# Patient Record
Sex: Male | Born: 1935 | Race: White | Hispanic: No | Marital: Married | Smoking: Former smoker
Health system: Southern US, Community
[De-identification: ages and names within clinical notes are randomized; demographics above are authoritative.]

## PROBLEM LIST (undated history)

## (undated) DIAGNOSIS — I499 Cardiac arrhythmia, unspecified: Secondary | ICD-10-CM

## (undated) DIAGNOSIS — I509 Heart failure, unspecified: Secondary | ICD-10-CM

---

## 2004-02-27 DIAGNOSIS — D126 Benign neoplasm of colon, unspecified: Secondary | ICD-10-CM

## 2004-08-08 ENCOUNTER — Emergency Department: Payer: Self-pay | Admitting: Unknown Physician Specialty

## 2004-12-19 ENCOUNTER — Ambulatory Visit: Payer: Self-pay | Admitting: Unknown Physician Specialty

## 2005-02-02 ENCOUNTER — Ambulatory Visit: Payer: Self-pay | Admitting: Family Medicine

## 2006-10-01 ENCOUNTER — Ambulatory Visit: Payer: Self-pay | Admitting: Family Medicine

## 2006-10-14 ENCOUNTER — Ambulatory Visit: Payer: Self-pay | Admitting: General Surgery

## 2006-10-19 ENCOUNTER — Ambulatory Visit: Payer: Self-pay | Admitting: General Surgery

## 2007-04-21 ENCOUNTER — Ambulatory Visit: Payer: Self-pay | Admitting: Family Medicine

## 2007-05-09 ENCOUNTER — Ambulatory Visit: Payer: Self-pay | Admitting: Unknown Physician Specialty

## 2008-02-06 ENCOUNTER — Ambulatory Visit: Payer: Self-pay | Admitting: Family Medicine

## 2008-02-09 ENCOUNTER — Inpatient Hospital Stay: Payer: Self-pay | Admitting: Internal Medicine

## 2008-02-29 ENCOUNTER — Ambulatory Visit: Payer: Self-pay | Admitting: Gastroenterology

## 2008-03-21 ENCOUNTER — Ambulatory Visit: Payer: Self-pay | Admitting: Family Medicine

## 2008-04-16 DIAGNOSIS — Z8719 Personal history of other diseases of the digestive system: Secondary | ICD-10-CM

## 2009-02-10 IMAGING — CR RIGHT HAND - COMPLETE 3+ VIEW
1 series · 3 of 3 positions shown · non-contrast
Comparison: none

REASON FOR EXAM: pathologic fracture;
COMMENTS:

[Series 2: view not recorded · 0.17mm/px · 3 of 3 slices shown]
[im 1/3]
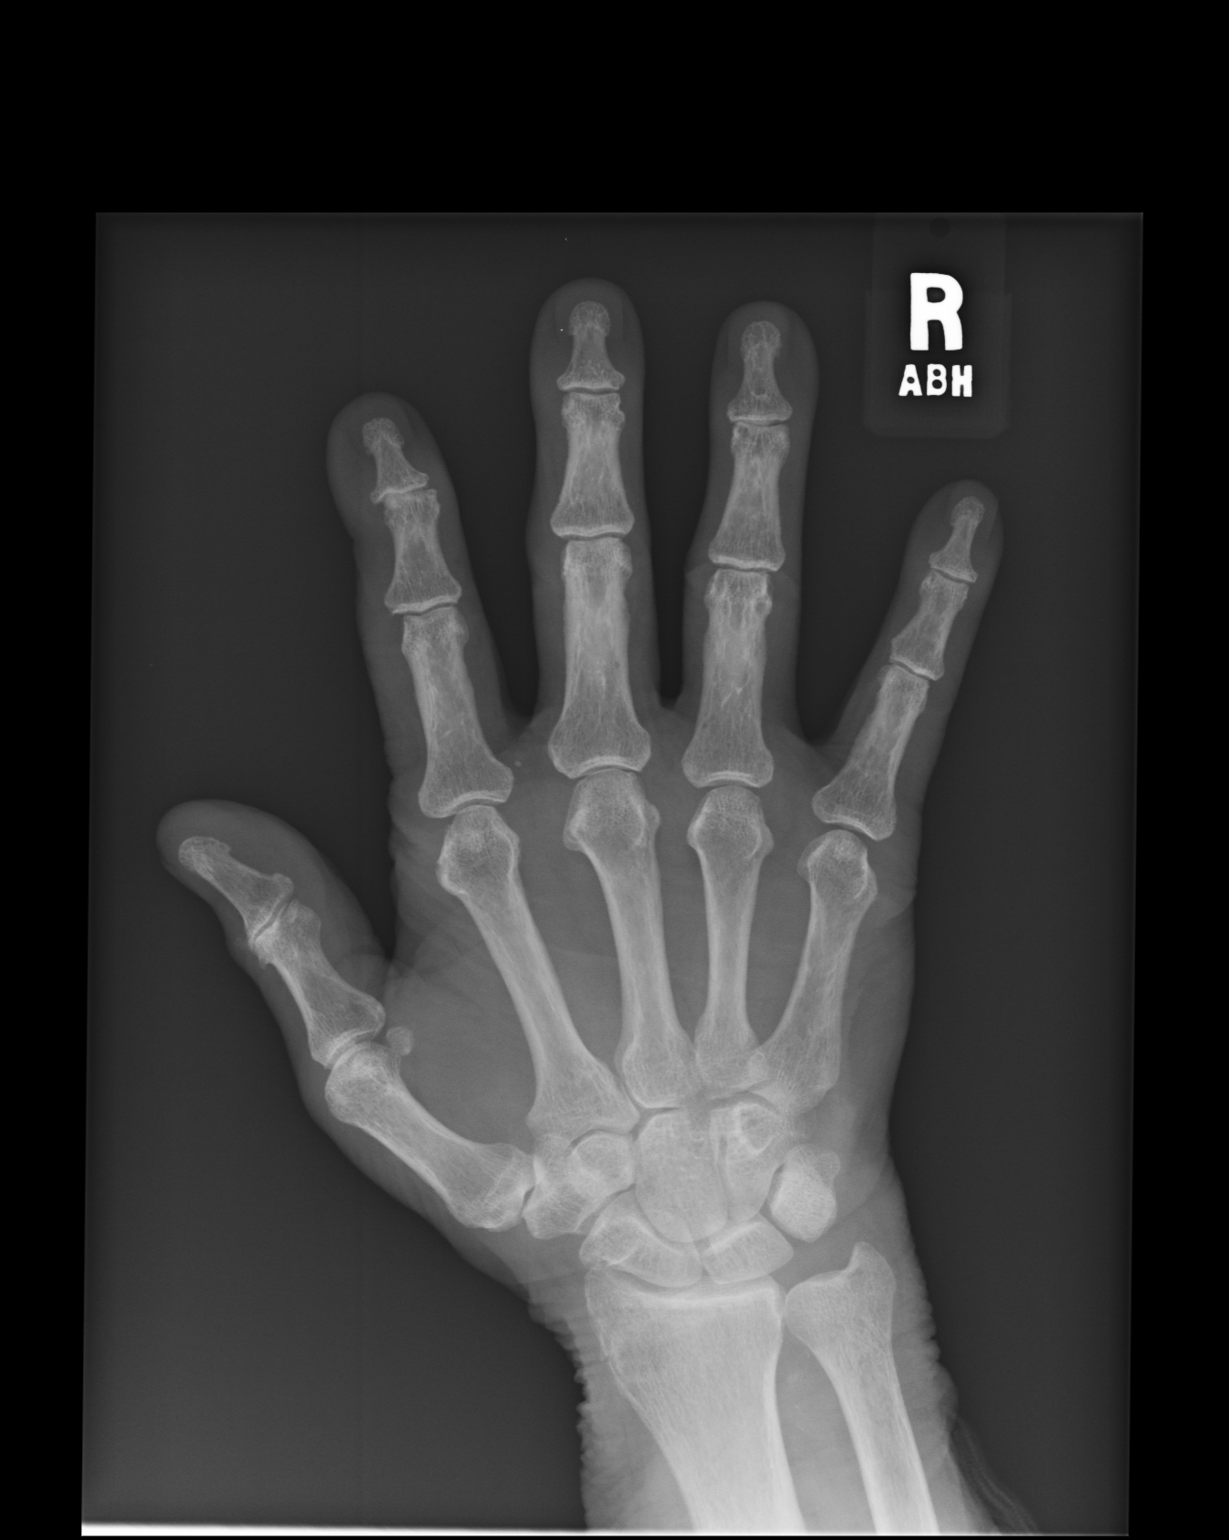
[im 2/3]
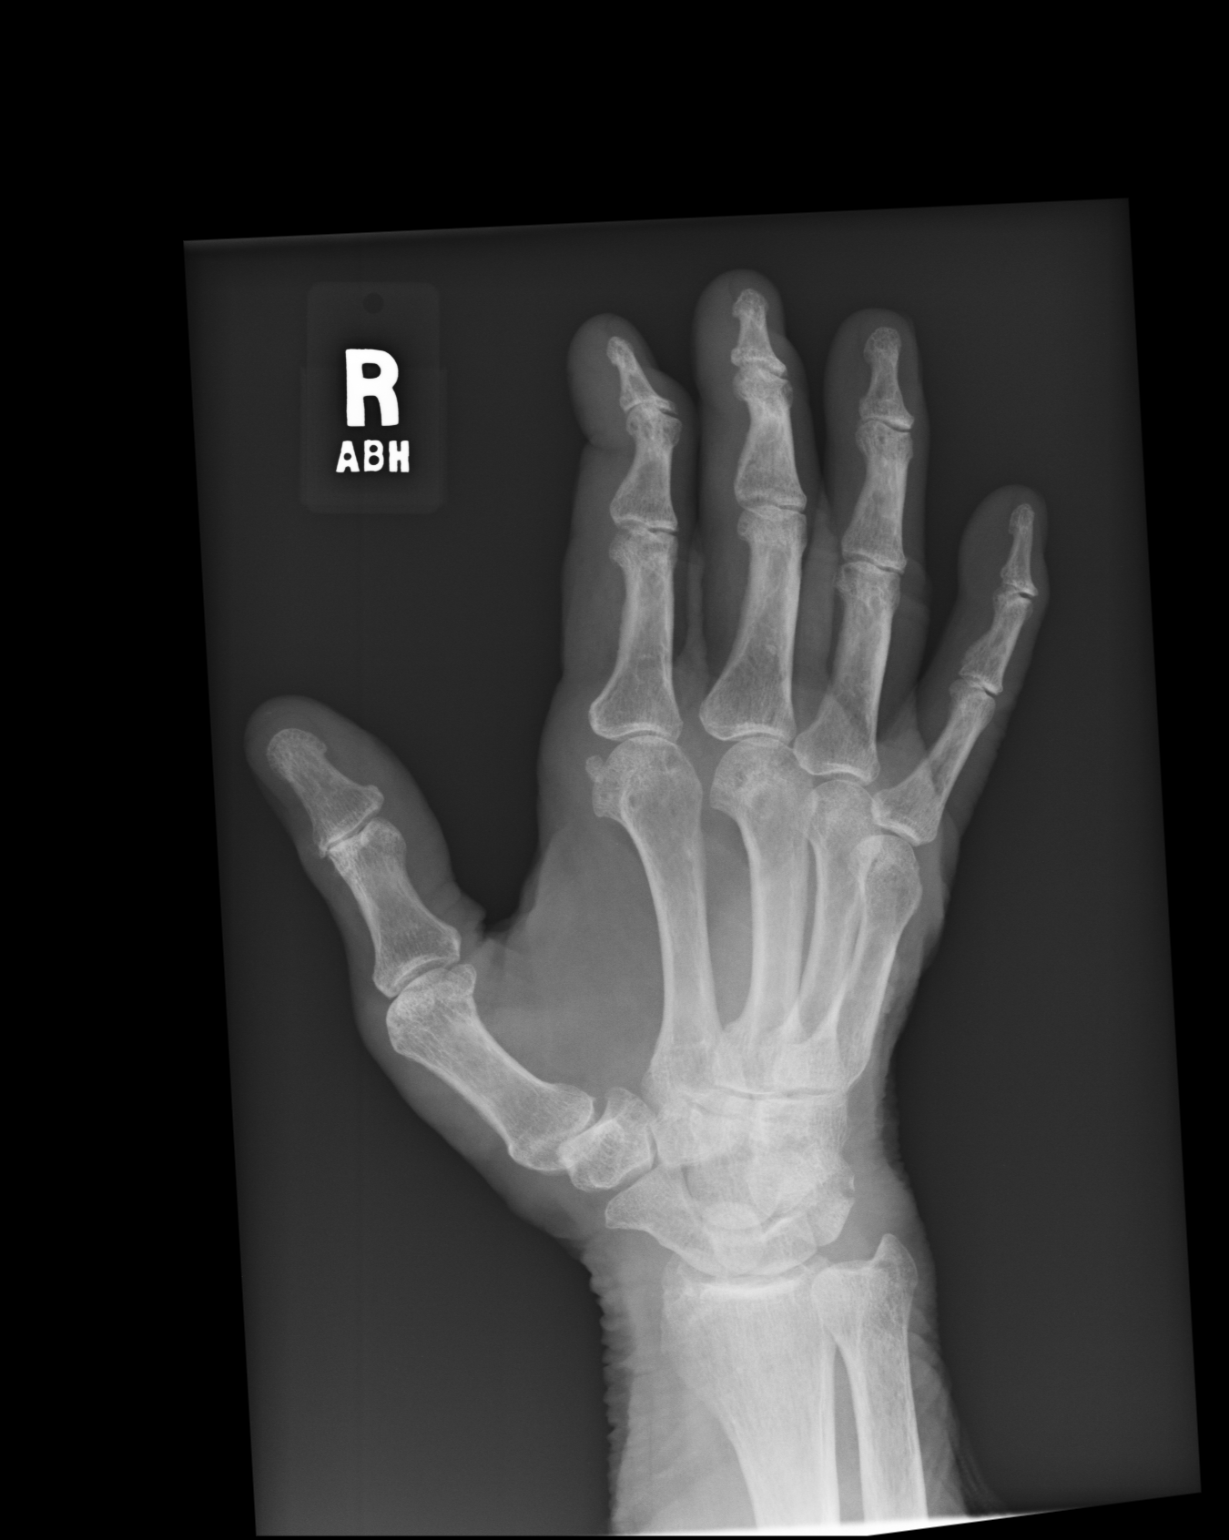
[im 3/3]
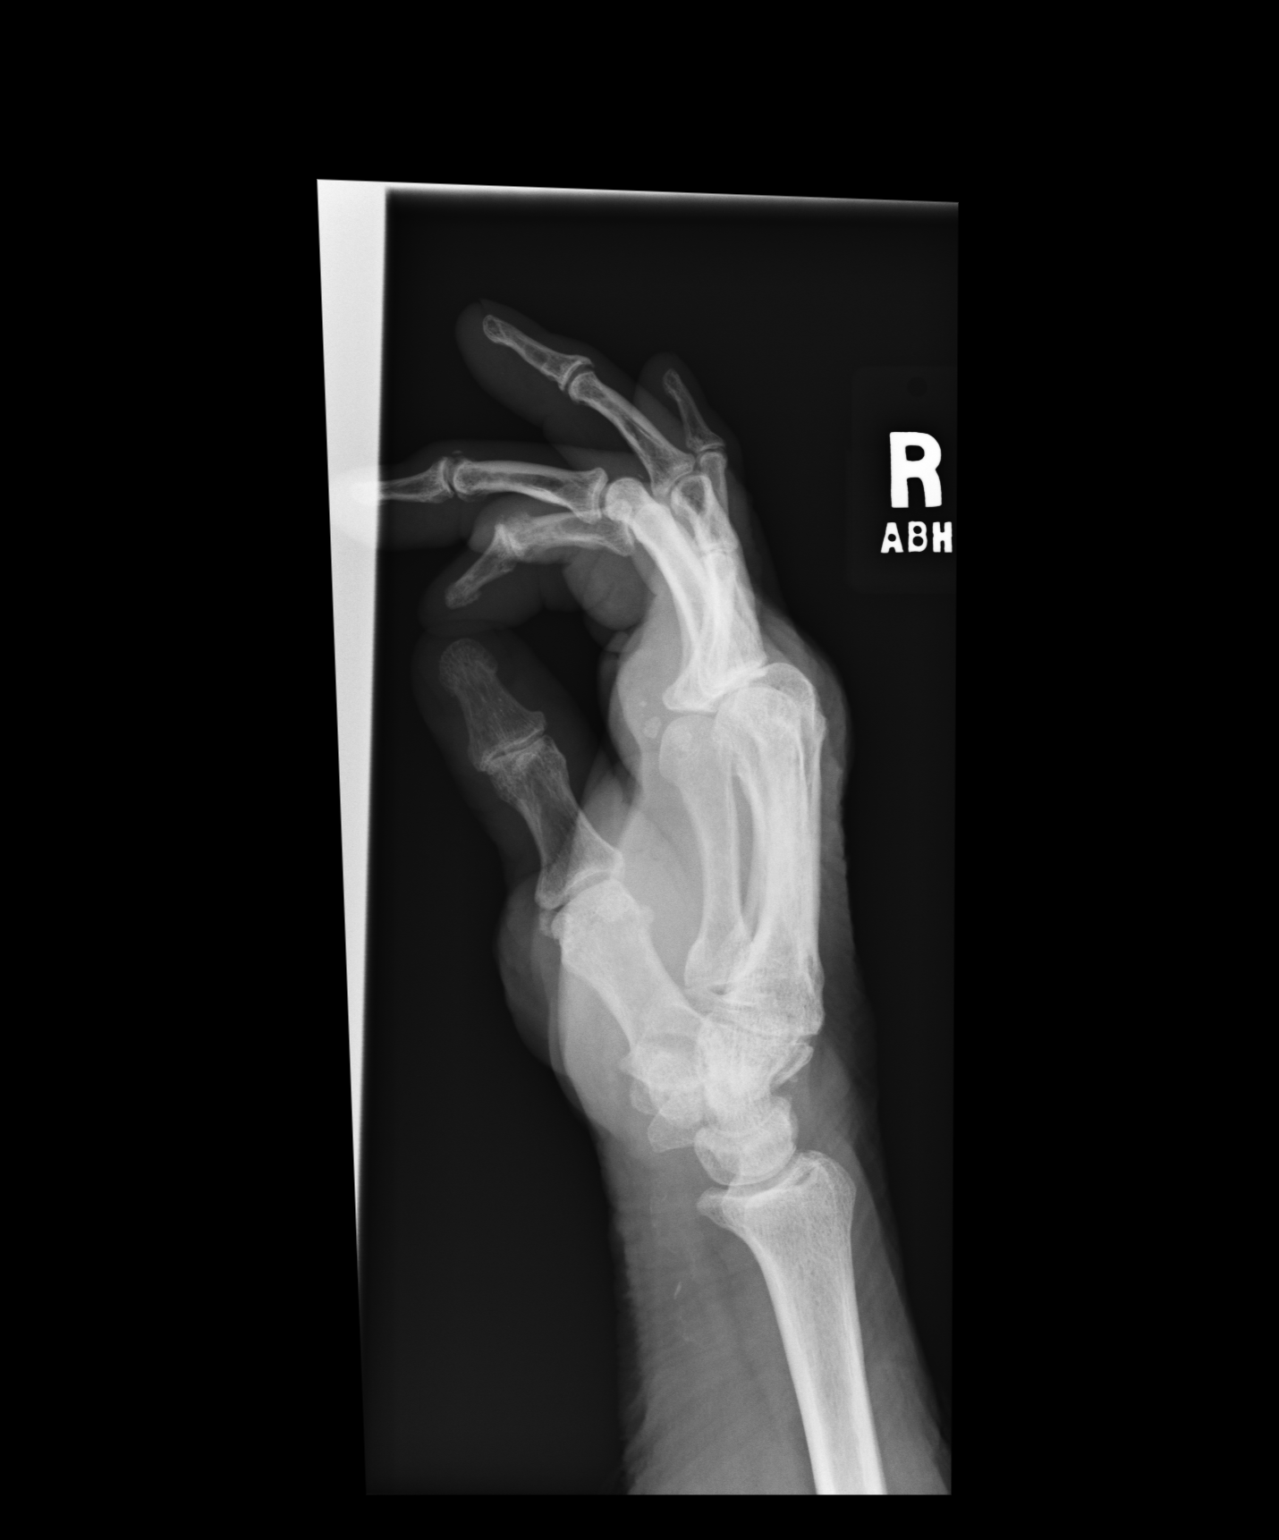

[3 of 3 positions shown; findings below may reference images not displayed]

PROCEDURE:     KDR - KDXR HAND RT COMPLETE W/OBLIQUES  - March 21, 2008  [DATE]

RESULT:     Three views of the hand were obtained. No fracture, dislocation,
or other acute bony abnormality is identified. Attention to the wrist again
shows a minimally displaced chip fracture of the dorsal aspect of the
triquetrum. There was also present on the exam of 02/06/2008. No additional
fractures are seen. There is noted mild degenerative spurring at the DIP
joints of the second through the fifth fingers and at the IP joint of the
thumb.
IMPRESSION: 1. There is an old chip fracture of the triquetrum.
2. No new fractures are identified.
3. Hypertrophic spurring is present at the DIP joints and at the IP joint of
the thumb as noted above.

## 2009-09-17 ENCOUNTER — Ambulatory Visit: Payer: Self-pay | Admitting: Ophthalmology

## 2009-09-25 ENCOUNTER — Ambulatory Visit: Payer: Self-pay | Admitting: Ophthalmology

## 2009-11-13 ENCOUNTER — Ambulatory Visit: Payer: Self-pay | Admitting: Anesthesiology

## 2009-11-13 ENCOUNTER — Ambulatory Visit: Payer: Self-pay | Admitting: Ophthalmology

## 2011-02-20 DIAGNOSIS — R42 Dizziness and giddiness: Secondary | ICD-10-CM | POA: Diagnosis not present

## 2011-02-20 DIAGNOSIS — Z7901 Long term (current) use of anticoagulants: Secondary | ICD-10-CM | POA: Diagnosis not present

## 2011-03-20 DIAGNOSIS — R42 Dizziness and giddiness: Secondary | ICD-10-CM | POA: Diagnosis not present

## 2011-03-20 DIAGNOSIS — Z7901 Long term (current) use of anticoagulants: Secondary | ICD-10-CM | POA: Diagnosis not present

## 2011-03-24 DIAGNOSIS — L570 Actinic keratosis: Secondary | ICD-10-CM | POA: Diagnosis not present

## 2011-03-24 DIAGNOSIS — L819 Disorder of pigmentation, unspecified: Secondary | ICD-10-CM | POA: Diagnosis not present

## 2011-03-24 DIAGNOSIS — L821 Other seborrheic keratosis: Secondary | ICD-10-CM | POA: Diagnosis not present

## 2011-03-24 DIAGNOSIS — C4401 Basal cell carcinoma of skin of lip: Secondary | ICD-10-CM | POA: Diagnosis not present

## 2011-03-24 DIAGNOSIS — L219 Seborrheic dermatitis, unspecified: Secondary | ICD-10-CM | POA: Diagnosis not present

## 2011-04-17 DIAGNOSIS — Z7901 Long term (current) use of anticoagulants: Secondary | ICD-10-CM | POA: Diagnosis not present

## 2011-04-17 DIAGNOSIS — R42 Dizziness and giddiness: Secondary | ICD-10-CM | POA: Diagnosis not present

## 2011-05-13 DIAGNOSIS — R42 Dizziness and giddiness: Secondary | ICD-10-CM | POA: Diagnosis not present

## 2011-05-13 DIAGNOSIS — Z7901 Long term (current) use of anticoagulants: Secondary | ICD-10-CM | POA: Diagnosis not present

## 2011-06-10 ENCOUNTER — Ambulatory Visit: Payer: Self-pay | Admitting: Family Medicine

## 2011-06-10 DIAGNOSIS — Z7901 Long term (current) use of anticoagulants: Secondary | ICD-10-CM | POA: Diagnosis not present

## 2011-06-10 DIAGNOSIS — J9819 Other pulmonary collapse: Secondary | ICD-10-CM | POA: Diagnosis not present

## 2011-06-10 DIAGNOSIS — R05 Cough: Secondary | ICD-10-CM | POA: Diagnosis not present

## 2011-07-08 DIAGNOSIS — Z7901 Long term (current) use of anticoagulants: Secondary | ICD-10-CM | POA: Diagnosis not present

## 2011-07-08 DIAGNOSIS — R05 Cough: Secondary | ICD-10-CM | POA: Diagnosis not present

## 2011-08-05 DIAGNOSIS — Z7901 Long term (current) use of anticoagulants: Secondary | ICD-10-CM | POA: Diagnosis not present

## 2011-08-05 DIAGNOSIS — R05 Cough: Secondary | ICD-10-CM | POA: Diagnosis not present

## 2011-09-04 DIAGNOSIS — Z7901 Long term (current) use of anticoagulants: Secondary | ICD-10-CM | POA: Diagnosis not present

## 2011-09-04 DIAGNOSIS — R05 Cough: Secondary | ICD-10-CM | POA: Diagnosis not present

## 2011-09-30 DIAGNOSIS — Z7901 Long term (current) use of anticoagulants: Secondary | ICD-10-CM | POA: Diagnosis not present

## 2011-09-30 DIAGNOSIS — R05 Cough: Secondary | ICD-10-CM | POA: Diagnosis not present

## 2011-10-12 DIAGNOSIS — Z7901 Long term (current) use of anticoagulants: Secondary | ICD-10-CM | POA: Diagnosis not present

## 2011-10-12 DIAGNOSIS — Z86010 Personal history of colonic polyps: Secondary | ICD-10-CM | POA: Diagnosis not present

## 2011-10-12 DIAGNOSIS — K921 Melena: Secondary | ICD-10-CM | POA: Diagnosis not present

## 2011-10-13 ENCOUNTER — Observation Stay: Payer: Self-pay | Admitting: Internal Medicine

## 2011-10-13 DIAGNOSIS — Z7901 Long term (current) use of anticoagulants: Secondary | ICD-10-CM | POA: Diagnosis not present

## 2011-10-13 DIAGNOSIS — Z8719 Personal history of other diseases of the digestive system: Secondary | ICD-10-CM | POA: Diagnosis not present

## 2011-10-13 DIAGNOSIS — K219 Gastro-esophageal reflux disease without esophagitis: Secondary | ICD-10-CM | POA: Diagnosis not present

## 2011-10-13 DIAGNOSIS — K922 Gastrointestinal hemorrhage, unspecified: Secondary | ICD-10-CM | POA: Diagnosis not present

## 2011-10-13 DIAGNOSIS — Z79899 Other long term (current) drug therapy: Secondary | ICD-10-CM | POA: Diagnosis not present

## 2011-10-13 DIAGNOSIS — I10 Essential (primary) hypertension: Secondary | ICD-10-CM | POA: Diagnosis not present

## 2011-10-13 DIAGNOSIS — E785 Hyperlipidemia, unspecified: Secondary | ICD-10-CM | POA: Diagnosis not present

## 2011-10-13 DIAGNOSIS — N400 Enlarged prostate without lower urinary tract symptoms: Secondary | ICD-10-CM | POA: Diagnosis not present

## 2011-10-13 DIAGNOSIS — Z806 Family history of leukemia: Secondary | ICD-10-CM | POA: Diagnosis not present

## 2011-10-13 DIAGNOSIS — K921 Melena: Secondary | ICD-10-CM | POA: Diagnosis not present

## 2011-10-13 DIAGNOSIS — Z9889 Other specified postprocedural states: Secondary | ICD-10-CM | POA: Diagnosis not present

## 2011-10-13 DIAGNOSIS — I4891 Unspecified atrial fibrillation: Secondary | ICD-10-CM | POA: Diagnosis not present

## 2011-10-13 LAB — CBC
HCT: 42.6 % (ref 40.0–52.0)
HGB: 14.4 g/dL (ref 13.0–18.0)
MCH: 31.5 pg (ref 26.0–34.0)
MCV: 93 fL (ref 80–100)
RBC: 4.58 10*6/uL (ref 4.40–5.90)
RDW: 14.1 % (ref 11.5–14.5)

## 2011-10-13 LAB — URINALYSIS, COMPLETE
Bacteria: NONE SEEN
Bilirubin,UR: NEGATIVE
Blood: NEGATIVE
Glucose,UR: NEGATIVE mg/dL (ref 0–75)
Leukocyte Esterase: NEGATIVE
Protein: NEGATIVE
RBC,UR: NONE SEEN /HPF (ref 0–5)
Specific Gravity: 1.008 (ref 1.003–1.030)
Squamous Epithelial: NONE SEEN
WBC UR: <1 /HPF (ref 0–5)

## 2011-10-13 LAB — COMPREHENSIVE METABOLIC PANEL
Albumin: 3.3 g/dL — ABNORMAL LOW (ref 3.4–5.0)
Alkaline Phosphatase: 81 U/L (ref 50–136)
Anion Gap: 7 (ref 7–16)
Bilirubin,Total: 0.6 mg/dL (ref 0.2–1.0)
Calcium, Total: 8.8 mg/dL (ref 8.5–10.1)
Co2: 26 mmol/L (ref 21–32)
Creatinine: 1.12 mg/dL (ref 0.60–1.30)
EGFR (Non-African Amer.): 59 — ABNORMAL LOW
Glucose: 108 mg/dL — ABNORMAL HIGH (ref 65–99)
Osmolality: 274 (ref 275–301)
Potassium: 3.8 mmol/L (ref 3.5–5.1)
SGOT(AST): 26 U/L (ref 15–37)
SGPT (ALT): 19 U/L (ref 12–78)
Sodium: 136 mmol/L (ref 136–145)
Total Protein: 7.1 g/dL (ref 6.4–8.2)

## 2011-10-13 LAB — PROTIME-INR
INR: 1.9
Prothrombin Time: 22.3 secs — ABNORMAL HIGH (ref 11.5–14.7)

## 2011-10-13 LAB — HEMOGLOBIN: HGB: 13.6 g/dL (ref 13.0–18.0)

## 2011-10-14 DIAGNOSIS — I10 Essential (primary) hypertension: Secondary | ICD-10-CM | POA: Diagnosis not present

## 2011-10-14 DIAGNOSIS — K922 Gastrointestinal hemorrhage, unspecified: Secondary | ICD-10-CM | POA: Diagnosis not present

## 2011-10-14 DIAGNOSIS — K920 Hematemesis: Secondary | ICD-10-CM | POA: Diagnosis not present

## 2011-10-14 DIAGNOSIS — K219 Gastro-esophageal reflux disease without esophagitis: Secondary | ICD-10-CM | POA: Diagnosis not present

## 2011-10-14 DIAGNOSIS — I4891 Unspecified atrial fibrillation: Secondary | ICD-10-CM | POA: Diagnosis not present

## 2011-10-14 LAB — HEMATOCRIT: HCT: 40.1 % (ref 40.0–52.0)

## 2011-10-14 LAB — PROTIME-INR: Prothrombin Time: 23.2 secs — ABNORMAL HIGH (ref 11.5–14.7)

## 2011-10-14 LAB — HEMOGLOBIN: HGB: 13.3 g/dL (ref 13.0–18.0)

## 2011-10-15 DIAGNOSIS — K922 Gastrointestinal hemorrhage, unspecified: Secondary | ICD-10-CM | POA: Diagnosis not present

## 2011-10-15 DIAGNOSIS — K219 Gastro-esophageal reflux disease without esophagitis: Secondary | ICD-10-CM | POA: Diagnosis not present

## 2011-10-15 DIAGNOSIS — I4891 Unspecified atrial fibrillation: Secondary | ICD-10-CM | POA: Diagnosis not present

## 2011-10-15 DIAGNOSIS — I10 Essential (primary) hypertension: Secondary | ICD-10-CM | POA: Diagnosis not present

## 2011-10-15 LAB — CBC WITH DIFFERENTIAL/PLATELET
Basophil #: 0 10*3/uL (ref 0.0–0.1)
HCT: 43 % (ref 40.0–52.0)
HGB: 14.5 g/dL (ref 13.0–18.0)
Lymphocyte #: 1 10*3/uL (ref 1.0–3.6)
MCH: 31.5 pg (ref 26.0–34.0)
MCHC: 33.7 g/dL (ref 32.0–36.0)
MCV: 93 fL (ref 80–100)
Monocyte %: 7.3 %
Platelet: 170 10*3/uL (ref 150–440)
RBC: 4.61 10*6/uL (ref 4.40–5.90)
RDW: 14.4 % (ref 11.5–14.5)
WBC: 7.2 10*3/uL (ref 3.8–10.6)

## 2011-10-21 DIAGNOSIS — I4891 Unspecified atrial fibrillation: Secondary | ICD-10-CM | POA: Diagnosis not present

## 2011-10-21 DIAGNOSIS — K922 Gastrointestinal hemorrhage, unspecified: Secondary | ICD-10-CM | POA: Diagnosis not present

## 2011-10-21 DIAGNOSIS — Z7901 Long term (current) use of anticoagulants: Secondary | ICD-10-CM | POA: Diagnosis not present

## 2011-11-11 DIAGNOSIS — Z23 Encounter for immunization: Secondary | ICD-10-CM | POA: Diagnosis not present

## 2011-11-11 DIAGNOSIS — Z8719 Personal history of other diseases of the digestive system: Secondary | ICD-10-CM | POA: Diagnosis not present

## 2011-11-11 DIAGNOSIS — Z7901 Long term (current) use of anticoagulants: Secondary | ICD-10-CM | POA: Diagnosis not present

## 2011-11-11 DIAGNOSIS — I4891 Unspecified atrial fibrillation: Secondary | ICD-10-CM | POA: Diagnosis not present

## 2011-11-11 DIAGNOSIS — K219 Gastro-esophageal reflux disease without esophagitis: Secondary | ICD-10-CM | POA: Diagnosis not present

## 2011-11-12 DIAGNOSIS — H353190 Nonexudative age-related macular degeneration, unspecified eye, stage unspecified: Secondary | ICD-10-CM | POA: Diagnosis not present

## 2011-11-27 DIAGNOSIS — I4891 Unspecified atrial fibrillation: Secondary | ICD-10-CM | POA: Diagnosis not present

## 2011-11-27 DIAGNOSIS — Z23 Encounter for immunization: Secondary | ICD-10-CM | POA: Diagnosis not present

## 2011-11-27 DIAGNOSIS — K219 Gastro-esophageal reflux disease without esophagitis: Secondary | ICD-10-CM | POA: Diagnosis not present

## 2011-11-29 ENCOUNTER — Emergency Department: Payer: Self-pay | Admitting: Emergency Medicine

## 2011-11-29 DIAGNOSIS — K922 Gastrointestinal hemorrhage, unspecified: Secondary | ICD-10-CM | POA: Diagnosis not present

## 2011-11-29 DIAGNOSIS — Z79899 Other long term (current) drug therapy: Secondary | ICD-10-CM | POA: Diagnosis not present

## 2011-11-29 DIAGNOSIS — Z9089 Acquired absence of other organs: Secondary | ICD-10-CM | POA: Diagnosis not present

## 2011-11-29 DIAGNOSIS — Z7982 Long term (current) use of aspirin: Secondary | ICD-10-CM | POA: Diagnosis not present

## 2011-11-29 DIAGNOSIS — K219 Gastro-esophageal reflux disease without esophagitis: Secondary | ICD-10-CM | POA: Diagnosis not present

## 2011-11-29 DIAGNOSIS — R6889 Other general symptoms and signs: Secondary | ICD-10-CM | POA: Diagnosis not present

## 2011-11-29 DIAGNOSIS — I10 Essential (primary) hypertension: Secondary | ICD-10-CM | POA: Diagnosis not present

## 2011-11-29 LAB — PROTIME-INR: Prothrombin Time: 14 secs (ref 11.5–14.7)

## 2011-11-29 LAB — CBC
HCT: 38.9 % — ABNORMAL LOW (ref 40.0–52.0)
HGB: 13.1 g/dL (ref 13.0–18.0)
MCH: 31.7 pg (ref 26.0–34.0)
MCHC: 33.7 g/dL (ref 32.0–36.0)
MCV: 94 fL (ref 80–100)
Platelet: 144 10*3/uL — ABNORMAL LOW (ref 150–440)
RBC: 4.13 10*6/uL — ABNORMAL LOW (ref 4.40–5.90)

## 2011-11-29 LAB — COMPREHENSIVE METABOLIC PANEL
Alkaline Phosphatase: 88 U/L (ref 50–136)
Bilirubin,Total: 0.7 mg/dL (ref 0.2–1.0)
Calcium, Total: 8.6 mg/dL (ref 8.5–10.1)
Chloride: 105 mmol/L (ref 98–107)
Co2: 28 mmol/L (ref 21–32)
EGFR (Non-African Amer.): 54 — ABNORMAL LOW
SGOT(AST): 21 U/L (ref 15–37)
SGPT (ALT): 16 U/L (ref 12–78)
Total Protein: 6.9 g/dL (ref 6.4–8.2)

## 2011-11-29 LAB — APTT: Activated PTT: 32.7 secs (ref 23.6–35.9)

## 2011-12-07 DIAGNOSIS — K219 Gastro-esophageal reflux disease without esophagitis: Secondary | ICD-10-CM | POA: Diagnosis not present

## 2012-03-14 DIAGNOSIS — L819 Disorder of pigmentation, unspecified: Secondary | ICD-10-CM | POA: Diagnosis not present

## 2012-03-14 DIAGNOSIS — L578 Other skin changes due to chronic exposure to nonionizing radiation: Secondary | ICD-10-CM | POA: Diagnosis not present

## 2012-03-14 DIAGNOSIS — L570 Actinic keratosis: Secondary | ICD-10-CM | POA: Diagnosis not present

## 2012-03-14 DIAGNOSIS — L821 Other seborrheic keratosis: Secondary | ICD-10-CM | POA: Diagnosis not present

## 2012-03-14 DIAGNOSIS — Z0189 Encounter for other specified special examinations: Secondary | ICD-10-CM | POA: Diagnosis not present

## 2012-03-22 DIAGNOSIS — I4891 Unspecified atrial fibrillation: Secondary | ICD-10-CM | POA: Diagnosis not present

## 2012-03-22 DIAGNOSIS — I10 Essential (primary) hypertension: Secondary | ICD-10-CM | POA: Diagnosis not present

## 2012-05-12 DIAGNOSIS — H353190 Nonexudative age-related macular degeneration, unspecified eye, stage unspecified: Secondary | ICD-10-CM | POA: Diagnosis not present

## 2012-09-20 DIAGNOSIS — K219 Gastro-esophageal reflux disease without esophagitis: Secondary | ICD-10-CM | POA: Diagnosis not present

## 2012-09-20 DIAGNOSIS — I4891 Unspecified atrial fibrillation: Secondary | ICD-10-CM | POA: Diagnosis not present

## 2012-11-10 DIAGNOSIS — H353190 Nonexudative age-related macular degeneration, unspecified eye, stage unspecified: Secondary | ICD-10-CM | POA: Diagnosis not present

## 2012-12-06 DIAGNOSIS — Z23 Encounter for immunization: Secondary | ICD-10-CM | POA: Diagnosis not present

## 2013-03-22 DIAGNOSIS — L819 Disorder of pigmentation, unspecified: Secondary | ICD-10-CM | POA: Diagnosis not present

## 2013-03-22 DIAGNOSIS — L821 Other seborrheic keratosis: Secondary | ICD-10-CM | POA: Diagnosis not present

## 2013-03-22 DIAGNOSIS — D1801 Hemangioma of skin and subcutaneous tissue: Secondary | ICD-10-CM | POA: Diagnosis not present

## 2013-03-22 DIAGNOSIS — L570 Actinic keratosis: Secondary | ICD-10-CM | POA: Diagnosis not present

## 2013-03-22 DIAGNOSIS — L578 Other skin changes due to chronic exposure to nonionizing radiation: Secondary | ICD-10-CM | POA: Diagnosis not present

## 2013-04-14 DIAGNOSIS — K219 Gastro-esophageal reflux disease without esophagitis: Secondary | ICD-10-CM | POA: Diagnosis not present

## 2013-04-14 DIAGNOSIS — R030 Elevated blood-pressure reading, without diagnosis of hypertension: Secondary | ICD-10-CM | POA: Diagnosis not present

## 2013-05-11 DIAGNOSIS — H353190 Nonexudative age-related macular degeneration, unspecified eye, stage unspecified: Secondary | ICD-10-CM | POA: Diagnosis not present

## 2013-06-02 DIAGNOSIS — K219 Gastro-esophageal reflux disease without esophagitis: Secondary | ICD-10-CM | POA: Diagnosis not present

## 2013-06-02 DIAGNOSIS — K5732 Diverticulitis of large intestine without perforation or abscess without bleeding: Secondary | ICD-10-CM | POA: Diagnosis not present

## 2013-11-09 DIAGNOSIS — H3530 Unspecified macular degeneration: Secondary | ICD-10-CM | POA: Diagnosis not present

## 2013-11-21 DIAGNOSIS — Z23 Encounter for immunization: Secondary | ICD-10-CM | POA: Diagnosis not present

## 2013-12-13 DIAGNOSIS — Z23 Encounter for immunization: Secondary | ICD-10-CM | POA: Diagnosis not present

## 2014-05-08 DIAGNOSIS — H3531 Nonexudative age-related macular degeneration: Secondary | ICD-10-CM | POA: Diagnosis not present

## 2014-05-08 DIAGNOSIS — H26492 Other secondary cataract, left eye: Secondary | ICD-10-CM | POA: Diagnosis not present

## 2014-06-05 NOTE — Discharge Summary (Signed)
PATIENT NAME:  Thomas Matthews, Thomas Matthews MR#:  [id number] DATE OF BIRTH:  Feb 23, 1923  DATE OF ADMISSION:  10/13/2011 DATE OF DISCHARGE:  10/15/2011  DIAGNOSES:  1. Lower GI bleed, possibly diverticular, resolved. 2. Hypertension. 3. Chronic atrial fibrillation. 4. Gastroesophageal reflux disease. 5. Benign prostatic hypertrophy.  6. Hyperlipidemia.  DISPOSITION: The patient is being discharged home.   FOLLOWUP: Follow up with primary care physician Dr. Lelon Huh within one week.   DIET: Low sodium.   ACTIVITY: As tolerated.   The patient has been advised not to take his Coumadin until seen by PCP.   DISCHARGE MEDICATIONS:  1. Lopressor 25 mg, 0.5 tablet b.i.d.  2. Omeprazole 20 mg daily.  3. Cardizem 120 mg daily.  4. Flomax 0.4 mg daily.  5. Pravachol 10 mg daily.  6. HCTZ/lisinopril 12.5/10 mg once a day.  7. Calcium/vitamin D 600/400, 1 tablet once a day.   CONSULTATIONS:  GI consultation with Dr. Dionne Milo.   LABORATORY, DIAGNOSTIC, AND RADIOLOGICAL DATA: INR 2 to 1.9, hemoglobin remained stable 14.4. Normal white count, normal platelet count. Complete metabolic panel normal.   HOSPITAL COURSE: The patient is an 81 year old male with past medical history of hypertension, chronic atrial fibrillation, gastroesophageal reflux disease, and benign prostatic hypertrophy who was on Coumadin and presented with lower GI bleed/bright red bleeding per rectum. The patient reported that he had similar problems in 2010 and had a colonoscopy at the [place] at which time he had diverticulosis. The patient was admitted to the hospital and his  Coumadin was held. His INR was therapeutic at 2. His hemoglobin was monitored closely. He remained hemodynamically stable. Hemoglobin was also stable. Initially he was on a clear liquid diet but was tolerating a soft liquid diet without any difficulty. A GI consult with Dr. Dionne Milo was obtained who recommended conservative management. The patient's blood pressure  and chronic atrial fibrillation remained well controlled. The patient was hesitant to stop his Coumadin because he was advised by his cardiologist that he needs to be on lifelong Coumadin. The patient was informed that due to his recent GI bleed he should hold his Coumadin until he follows up with his PCP at which time he can discuss with his PCP and decide to discontinue or continue Coumadin. The patient was discharged in stable condition.   TIME SPENT: 45 minutes.    ____________________________ Cherre Huger, MD sp:bjt D: 10/15/2011 16:31:41 ET T: 10/16/2011 11:45:54 ET JOB#: [id number]  cc: Cherre Huger, MD, <Dictator> [place] Caryn Section, MD Cherre Huger MD ELECTRONICALLY SIGNED 10/16/2011 13:41

## 2014-06-05 NOTE — Consult Note (Signed)
Brief Consult Note: Diagnosis: Diverticular bleed.   Patient was seen by consultant.   Discussed with Attending MD.   Comments: Diverticular bleed, resolved.  Recommendations; Full liquid diet, advance to soft in am if no further bleeding. Probable DC home in an if no further bleeding with [facility].  Electronic Signatures: Jill Side (MD)  (Signed 28-Aug-13 17:33)  Authored: Brief Consult Note   Last Updated: 28-Aug-13 17:33 by Jill Side (MD)

## 2014-06-05 NOTE — H&P (Signed)
PATIENT NAME:  Thomas Matthews, Thomas Matthews MR#:  [id number] DATE OF BIRTH:  10-25-1923  DATE OF ADMISSION:  10/13/2011  PRIMARY CARE PHYSICIAN: Kirstie Peri. Caryn Section, MD   REFERRING PHYSICIAN:   Lisa Roca, MD   CHIEF COMPLAINT: GI bleed.   HISTORY OF PRESENT ILLNESS: The patient is an 81 year old male with a known history of hypertension, hyperlipidemia, chronic atrial fibrillation, on Coumadin. He is being admitted for GI bleed. The patient started having bloody stools since Sunday. He had a grossly bloody bowel movement at 5:00 in the morning on Sunday after which he did have two more bowel movements on Sunday which were normal without any blood. He had another two episodes of bloody bowel movement on Monday followed by another one this morning which was very small, and most of the blood was mainly when he was wiping when he noticed a lot of blood on the tissue paper. He denies any abdominal pain, nausea, vomiting, or any other symptoms.   PAST MEDICAL HISTORY:  1. Hypertension.  2. Hyperlipidemia.  3. Chronic atrial fibrillation, on Coumadin.  4. History of benign prostatic hypertrophy.  5. Gastroesophageal reflux disease.   ALLERGIES: No known drug allergies.   SOCIAL HISTORY: No smoking or alcohol use. He worked at [employer] as a Clinical research associate.   FAMILY HISTORY: Father with leukemia.   PAST SURGICAL HISTORY: Cholecystectomy in 2008.     MEDICATIONS AT HOME:  1. Tamsulosin 0.4 mg p.o. daily.  2. Calcium with vitamin D 1 tablet p.o. daily.  3. Coumadin 2 mg p.o. daily.  4. Cardizem 120 mg p.o. daily. 5. Hydrochlorothiazide/lisinopril 12.5/10, 1 tablet p.o. daily.  6. Metoprolol 12.5 mg p.o. b.i.d.  7. Omeprazole 20 mg p.o. daily.  8. Pravastatin 10 mg p.o. at bedtime.   REVIEW OF SYSTEMS: CONSTITUTIONAL: No fever, fatigue, weakness. EYES: No blurred or double vision. ENT: No tinnitus, ear pain. RESPIRATORY: No cough, hemoptysis. CARDIOVASCULAR: No chest pain, orthopnea or  edema. GASTROINTESTINAL: No nausea, vomiting, diarrhea. Did have blood in the stool on several occasions. GENITOURINARY: No dysuria or hematuria. ENDOCRINE: No polyuria or nocturia. HEMATOLOGY: No anemia or easy bruising. SKIN: No rash or lesion. MUSCULOSKELETAL: No arthritis or muscle cramp. NEUROLOGICAL: No tingling, numbness, or weakness. PSYCHIATRIC: No history of anxiety or depression.   PHYSICAL EXAMINATION:  VITAL SIGNS: Temperature 97.3, heart rate 71 per minute, respirations 20 per minute, blood pressure 155/78 mmHg. He is saturating 97% on room air.   GENERAL: The patient is an 81 year old male lying in the bed comfortably in no acute distress.   HEENT: Eyes: Pupils are equal, round, reactive to light and accommodation. No scleral icterus. Extraocular muscles are intact. HENT: Head atraumatic, normocephalic. Oropharynx and nasopharynx clear.   NECK: Supple. No jugular venous distention. No thyroid enlargement or tenderness.   LUNGS: Clear to auscultation bilaterally. No wheezing, rales, rhonchi, or crepitation.   CARDIOVASCULAR: Irregularly irregular heart sound, mild tachycardia. No murmurs, rubs, or gallop.   ABDOMEN: Soft, nontender, nondistended. Bowel sounds present. No organomegaly or mass.   EXTREMITIES: No pedal edema, cyanosis, or clubbing.  NEUROLOGIC: Nonfocal examination. Cranial nerves II through XII are intact. Muscle strength 5/5 in all extremities. Sensation intact. The patient is oriented to time, place and person x3.   SKIN: No obvious rash, lesion, or ulcer.   LABORATORY, DIAGNOSTIC AND RADIOLOGICAL DATA: Normal BMP. Normal liver function tests and CBC. Normal coags except PT of 22.3. INR was 1.9. Urinalysis was negative.   IMPRESSION AND PLAN:  1.  Gastrointestinal bleed, possibly diverticular in nature: We will consult GI. Monitor his hemoglobin and hematocrit every 8 hours. Hold Coumadin for now.  2. Chronic fibrillation, on Coumadin: We will hold Coumadin for  now. His rate is under good control. We will monitor.  3. Gastroesophageal reflux disease: We will start him on Protonix b.i.d.  4. Hypertension: His blood pressure seems to be fairly stable. We will monitor him on home medication.   CODE STATUS:  FULL CODE.     TIME TAKEN: Total time taking care of this patient, including coordination of care, discussing with the ED physician, reviewing records was 55 minutes.  ____________________________ Lucina Mellow. Manuella Ghazi, MD vss:cbb D: 10/13/2011 16:14:22 ET T: 10/13/2011 17:18:12 ET JOB#: [id number]  cc: Ardelia Wrede S. Manuella Ghazi, MD, <Dictator> Kirstie Peri. Caryn Section, MD Remer Macho MD ELECTRONICALLY SIGNED 10/13/2011 18:41

## 2014-06-05 NOTE — Consult Note (Signed)
PATIENT NAME:  Thomas Matthews, GOBERT MR#:  [id number] DATE OF BIRTH:  01/06/1924  DATE OF CONSULTATION:  10/15/2011  REFERRING PHYSICIAN:  Cherre Huger, MD  CONSULTING PHYSICIAN:  Jill Side, MD  PRIMARY CARE PHYSICIAN: Lelon Huh, MD   REASON FOR CONSULTATION: Rectal bleeding.    HISTORY OF PRESENT ILLNESS: The patient is an 81 year old man with history of hypertension, hyperlipidemia, chronic atrial fibrillation on Coumadin. The patient has history of diverticulosis. Apparently he had a colonoscopy in 2006 at [facility] which showed diverticulosis of the left colon. According to the patient, he had another colonoscopy at [facility] in 2010 due to rectal bleeding which was also supposed to be due to diverticulosis. The patient was admitted on 10/13/2011 after he had two bloody bowel movements at home. The blood was reported to be bright to dark red and was mostly seen on the toilet paper. No melena was reported by the patient. He denied any abdominal pain, nausea, vomiting, or hematemesis. When evaluated yesterday he denied any further bleeding and as of today he has not had any further bleeding. In fact, he has had several formed bowel movements since admission and they have been reported to be brown without blood.   PAST MEDICAL HISTORY:  1. Hypertension. 2. Hyperlipidemia. 3. History of chronic atrial fibrillation, on Coumadin.  4. History of benign prostatic hypertrophy. 5. Gastroesophageal reflux disease.   ALLERGIES: None.   SOCIAL HISTORY: Does not smoke or drink.   FAMILY HISTORY: Unremarkable.   PAST SURGICAL HISTORY:  1. History of cholecystectomy in 2008. 2. History of colonoscopies in the past.   MEDICATIONS AT HOME:  1. Coumadin 2 mg a day. 2. Cardizem 120 mg a day. 3. Hydrochlorothiazide/lisinopril. 4. Metoprolol.  5. Omeprazole.   6. Pravastatin.   REVIEW OF SYSTEMS: Grossly negative except for what is mentioned in the history of present illness.   SOCIAL AND FAMILY  HISTORY: Unremarkable.   PHYSICAL EXAMINATION:   GENERAL: Elderly but fairly well built male who does not appear to be in any acute distress quite awake, alert, and oriented. Does not appear to be in any distress.    VITAL SIGNS: Temperature 97.3, pulse 60, respirations 18 to 20, blood pressure 158/75.   LUNGS: Clear to auscultation.   CARDIOVASCULAR: Irregular rate and rhythm. No gallops or murmurs were heard.   ABDOMEN: Quite soft and benign. Bowel sounds are positive. Nontender, nondistended. No rebound or guarding.   EXTREMITIES: No edema, clubbing, or cyanosis.   NEUROLOGIC: Appears to be unremarkable.   LABORATORY, DIAGNOSTIC, AND RADIOLOGICAL DATA: His hemoglobin on admission was 14.4. His hemoglobin as of today after 48 hours is still 14.5. INR is 1.9 today. It was 1.9 on admission as well. Electrolytes, BUN, and creatinine within normal limits.   ASSESSMENT AND PLAN: The patient is with some rectal bleeding. His hemoglobin and hematocrit have been normal and there has been no further bleeding since admission. Most likely the bleeding was hemorrhoidal in origin as suggested by the fact that most of the blood was seen on the toilet paper on wiping and his hemoglobin and hematocrit has not changed. The patient has history of diverticulosis and diverticular bleeding is certainly a possibility although less likely. There have been no signs of further bleeding in the last 36 to 48 hours. He seems to be doing well with normal hemoglobin and hematocrit. The patient is currently being discharged home with which I agree. [facility]  [facility].   ____________________________ Jill Side, MD si:drc D: 10/15/2011 12:43:08 ET T: 10/15/2011 13:09:14 ET JOB#: [id number]  cc: Jill Side, MD, <Dictator> Kirstie Peri. Caryn Section, MD Jill Side MD ELECTRONICALLY SIGNED 10/28/2011 11:02

## 2014-06-07 DIAGNOSIS — H26492 Other secondary cataract, left eye: Secondary | ICD-10-CM | POA: Diagnosis not present

## 2014-11-15 DIAGNOSIS — Z23 Encounter for immunization: Secondary | ICD-10-CM | POA: Diagnosis not present

## 2014-12-03 DIAGNOSIS — Z85828 Personal history of other malignant neoplasm of skin: Secondary | ICD-10-CM | POA: Diagnosis not present

## 2014-12-03 DIAGNOSIS — K055 Other periodontal diseases: Secondary | ICD-10-CM | POA: Diagnosis not present

## 2014-12-21 DIAGNOSIS — H353132 Nonexudative age-related macular degeneration, bilateral, intermediate dry stage: Secondary | ICD-10-CM | POA: Diagnosis not present

## 2015-01-08 DIAGNOSIS — D485 Neoplasm of uncertain behavior of skin: Secondary | ICD-10-CM | POA: Diagnosis not present

## 2015-01-08 DIAGNOSIS — L905 Scar conditions and fibrosis of skin: Secondary | ICD-10-CM | POA: Diagnosis not present

## 2015-01-08 DIAGNOSIS — L570 Actinic keratosis: Secondary | ICD-10-CM | POA: Diagnosis not present

## 2015-01-08 DIAGNOSIS — L219 Seborrheic dermatitis, unspecified: Secondary | ICD-10-CM | POA: Diagnosis not present

## 2015-01-08 DIAGNOSIS — K055 Other periodontal diseases: Secondary | ICD-10-CM | POA: Diagnosis not present

## 2015-03-13 DIAGNOSIS — Z85828 Personal history of other malignant neoplasm of skin: Secondary | ICD-10-CM | POA: Diagnosis not present

## 2015-03-13 DIAGNOSIS — L570 Actinic keratosis: Secondary | ICD-10-CM | POA: Diagnosis not present

## 2015-07-16 DIAGNOSIS — H353132 Nonexudative age-related macular degeneration, bilateral, intermediate dry stage: Secondary | ICD-10-CM | POA: Diagnosis not present

## 2015-07-19 DIAGNOSIS — H353221 Exudative age-related macular degeneration, left eye, with active choroidal neovascularization: Secondary | ICD-10-CM | POA: Diagnosis not present

## 2015-08-30 DIAGNOSIS — H353221 Exudative age-related macular degeneration, left eye, with active choroidal neovascularization: Secondary | ICD-10-CM | POA: Diagnosis not present

## 2015-10-03 ENCOUNTER — Ambulatory Visit: Payer: Self-pay | Admitting: Family Medicine

## 2015-10-08 DIAGNOSIS — D485 Neoplasm of uncertain behavior of skin: Secondary | ICD-10-CM | POA: Diagnosis not present

## 2015-10-08 DIAGNOSIS — L905 Scar conditions and fibrosis of skin: Secondary | ICD-10-CM | POA: Diagnosis not present

## 2015-10-08 DIAGNOSIS — L821 Other seborrheic keratosis: Secondary | ICD-10-CM | POA: Diagnosis not present

## 2015-10-08 DIAGNOSIS — C44319 Basal cell carcinoma of skin of other parts of face: Secondary | ICD-10-CM | POA: Diagnosis not present

## 2015-10-08 DIAGNOSIS — Z08 Encounter for follow-up examination after completed treatment for malignant neoplasm: Secondary | ICD-10-CM | POA: Diagnosis not present

## 2015-10-08 DIAGNOSIS — C44212 Basal cell carcinoma of skin of right ear and external auricular canal: Secondary | ICD-10-CM | POA: Diagnosis not present

## 2015-10-08 DIAGNOSIS — X32XXXA Exposure to sunlight, initial encounter: Secondary | ICD-10-CM | POA: Diagnosis not present

## 2015-10-08 DIAGNOSIS — Z85828 Personal history of other malignant neoplasm of skin: Secondary | ICD-10-CM | POA: Diagnosis not present

## 2015-10-08 DIAGNOSIS — L570 Actinic keratosis: Secondary | ICD-10-CM | POA: Diagnosis not present

## 2015-10-11 DIAGNOSIS — H353221 Exudative age-related macular degeneration, left eye, with active choroidal neovascularization: Secondary | ICD-10-CM | POA: Diagnosis not present

## 2015-10-30 DIAGNOSIS — Z23 Encounter for immunization: Secondary | ICD-10-CM | POA: Diagnosis not present

## 2015-11-14 DIAGNOSIS — C44319 Basal cell carcinoma of skin of other parts of face: Secondary | ICD-10-CM | POA: Diagnosis not present

## 2015-11-14 DIAGNOSIS — C44212 Basal cell carcinoma of skin of right ear and external auricular canal: Secondary | ICD-10-CM | POA: Diagnosis not present

## 2015-11-21 DIAGNOSIS — C44319 Basal cell carcinoma of skin of other parts of face: Secondary | ICD-10-CM | POA: Diagnosis not present

## 2015-11-21 DIAGNOSIS — C44212 Basal cell carcinoma of skin of right ear and external auricular canal: Secondary | ICD-10-CM | POA: Diagnosis not present

## 2015-11-21 DIAGNOSIS — L905 Scar conditions and fibrosis of skin: Secondary | ICD-10-CM | POA: Diagnosis not present

## 2015-11-21 DIAGNOSIS — H353221 Exudative age-related macular degeneration, left eye, with active choroidal neovascularization: Secondary | ICD-10-CM | POA: Diagnosis not present

## 2016-01-07 DIAGNOSIS — H353221 Exudative age-related macular degeneration, left eye, with active choroidal neovascularization: Secondary | ICD-10-CM | POA: Diagnosis not present

## 2016-02-21 DIAGNOSIS — H353221 Exudative age-related macular degeneration, left eye, with active choroidal neovascularization: Secondary | ICD-10-CM | POA: Diagnosis not present

## 2016-02-25 DIAGNOSIS — X32XXXA Exposure to sunlight, initial encounter: Secondary | ICD-10-CM | POA: Diagnosis not present

## 2016-02-25 DIAGNOSIS — Z85828 Personal history of other malignant neoplasm of skin: Secondary | ICD-10-CM | POA: Diagnosis not present

## 2016-02-25 DIAGNOSIS — D485 Neoplasm of uncertain behavior of skin: Secondary | ICD-10-CM | POA: Diagnosis not present

## 2016-02-25 DIAGNOSIS — C44319 Basal cell carcinoma of skin of other parts of face: Secondary | ICD-10-CM | POA: Diagnosis not present

## 2016-02-25 DIAGNOSIS — L570 Actinic keratosis: Secondary | ICD-10-CM | POA: Diagnosis not present

## 2016-02-25 DIAGNOSIS — Z08 Encounter for follow-up examination after completed treatment for malignant neoplasm: Secondary | ICD-10-CM | POA: Diagnosis not present

## 2016-04-03 DIAGNOSIS — H353211 Exudative age-related macular degeneration, right eye, with active choroidal neovascularization: Secondary | ICD-10-CM | POA: Diagnosis not present

## 2016-04-03 DIAGNOSIS — H353221 Exudative age-related macular degeneration, left eye, with active choroidal neovascularization: Secondary | ICD-10-CM | POA: Diagnosis not present

## 2016-04-16 DIAGNOSIS — C44319 Basal cell carcinoma of skin of other parts of face: Secondary | ICD-10-CM | POA: Diagnosis not present

## 2016-04-16 DIAGNOSIS — L905 Scar conditions and fibrosis of skin: Secondary | ICD-10-CM | POA: Diagnosis not present

## 2016-04-17 DIAGNOSIS — H353221 Exudative age-related macular degeneration, left eye, with active choroidal neovascularization: Secondary | ICD-10-CM | POA: Diagnosis not present

## 2016-04-23 DIAGNOSIS — L905 Scar conditions and fibrosis of skin: Secondary | ICD-10-CM | POA: Diagnosis not present

## 2016-04-23 DIAGNOSIS — C44319 Basal cell carcinoma of skin of other parts of face: Secondary | ICD-10-CM | POA: Diagnosis not present

## 2016-05-04 DIAGNOSIS — H34831 Tributary (branch) retinal vein occlusion, right eye, with macular edema: Secondary | ICD-10-CM | POA: Diagnosis not present

## 2016-05-04 DIAGNOSIS — H353211 Exudative age-related macular degeneration, right eye, with active choroidal neovascularization: Secondary | ICD-10-CM | POA: Diagnosis not present

## 2016-06-02 DIAGNOSIS — H34831 Tributary (branch) retinal vein occlusion, right eye, with macular edema: Secondary | ICD-10-CM | POA: Diagnosis not present

## 2016-06-08 ENCOUNTER — Ambulatory Visit (INDEPENDENT_AMBULATORY_CARE_PROVIDER_SITE_OTHER): Payer: Medicare Other | Admitting: Podiatry

## 2016-06-08 ENCOUNTER — Encounter: Payer: Self-pay | Admitting: Podiatry

## 2016-06-08 VITALS — BP 148/83 | HR 63 | Resp 16

## 2016-06-08 DIAGNOSIS — L03116 Cellulitis of left lower limb: Secondary | ICD-10-CM

## 2016-06-08 DIAGNOSIS — W57XXXA Bitten or stung by nonvenomous insect and other nonvenomous arthropods, initial encounter: Secondary | ICD-10-CM

## 2016-06-08 MED ORDER — CEPHALEXIN 500 MG PO CAPS: 500.0000 mg | ORAL_CAPSULE | Freq: Four times a day (QID) | ORAL | 0 refills | Status: DC

## 2016-06-08 NOTE — Progress Notes (Signed)
Subjective:     Patient ID: Thomas Matthews, male   DOB: Apr 20, 1923, 83 y.o.   MRN: [id number]  HPI this patient presents the office with chief complaint of a painful left ankle. He says that 2 days ago. He was working in his yard. He says he developed a red, painful area over the top of his left forefoot. This has since become just a painful area with no swelling noted. He says yesterday he developed a red spot on the anterior aspect of the ankle, which is associated with pain and swelling.  He says that his pain is swelling has gone down today but he presents the office today for an evaluation and treatment of this condition. He has provided no self treatment nor sought any professional help.  His daughter states she applied natural oils to this area but minial improvement was noted.   Review of Systems     Objective:   Physical Exam GENERAL APPEARANCE: Alert, conversant. Appropriately groomed. No acute distress.  VASCULAR: Pedal pulses are  palpable at  [facility] and PT bilateral.  Capillary refill time is immediate to all digits,  Normal temperature gradient.  Digital hair growth is present bilateral  NEUROLOGIC: sensation is normal to 5.07 monofilament at 5/5 sites bilateral.  Light touch is intact bilateral, Muscle strength normal.  MUSCULOSKELETAL: acceptable muscle strength, tone and stability bilateral.  Intrinsic muscluature intact bilateral.  Rectus appearance of foot and digits noted bilateral. Swollen painful left ankle.  Limited ROM  Of left ankle.  There is pitting edema with increased temperature.    DERMATOLOGIC: skin color, texture, and turgor are within normal limits.  No preulcerative lesions or ulcers  are seen, no interdigital maceration noted.  No open lesions present.  Digital nails are asymptomatic. No drainage noted. There is red inflamed swollen area on the anterior aspect left ankle.  Palpable pain on the anterior aspect of ankle and along the lateral malleolus. Red inflamed skin  lesion over the dorsum of the third and fourth metabasis, left foot. No swelling is noted, but he does react to palpation        Assessment:     Cellulitis left ankle.  Insect Bite left ankle.    Plan:     IE   , Examined the left ankle and there is a prominent red, inflamed area anterior to the ankle, but no evidence of any streaking is noted at this time.  There is red, inflamed area at  the point of maximum  pain anterior left ankle.  Localized cellulitis is consistent with his history of walking outside and having a insect bite. Also, we need to consider a possible arthritic flareup such as gout in the left ankle.  I opted to treat him for a cellulitis, which was more probable based on his history. Prescribe cephalexin 500 mg #28 one 4 times a day he is to return to the office if there is no improvement. He is to return the office in one week for further evaluation and treatment. He was also given ice pack to place on his site of maximum pain.    Gardiner Barefoot DPM

## 2016-06-15 DIAGNOSIS — H353221 Exudative age-related macular degeneration, left eye, with active choroidal neovascularization: Secondary | ICD-10-CM | POA: Diagnosis not present

## 2016-07-17 DIAGNOSIS — H353211 Exudative age-related macular degeneration, right eye, with active choroidal neovascularization: Secondary | ICD-10-CM | POA: Diagnosis not present

## 2016-07-28 DIAGNOSIS — Z08 Encounter for follow-up examination after completed treatment for malignant neoplasm: Secondary | ICD-10-CM | POA: Diagnosis not present

## 2016-07-28 DIAGNOSIS — L570 Actinic keratosis: Secondary | ICD-10-CM | POA: Diagnosis not present

## 2016-07-28 DIAGNOSIS — Z85828 Personal history of other malignant neoplasm of skin: Secondary | ICD-10-CM | POA: Diagnosis not present

## 2016-07-28 DIAGNOSIS — X32XXXA Exposure to sunlight, initial encounter: Secondary | ICD-10-CM | POA: Diagnosis not present

## 2016-07-28 DIAGNOSIS — L821 Other seborrheic keratosis: Secondary | ICD-10-CM | POA: Diagnosis not present

## 2016-07-28 DIAGNOSIS — C44319 Basal cell carcinoma of skin of other parts of face: Secondary | ICD-10-CM | POA: Diagnosis not present

## 2016-07-28 DIAGNOSIS — D0461 Carcinoma in situ of skin of right upper limb, including shoulder: Secondary | ICD-10-CM | POA: Diagnosis not present

## 2016-07-28 DIAGNOSIS — D485 Neoplasm of uncertain behavior of skin: Secondary | ICD-10-CM | POA: Diagnosis not present

## 2016-08-24 DIAGNOSIS — H353221 Exudative age-related macular degeneration, left eye, with active choroidal neovascularization: Secondary | ICD-10-CM | POA: Diagnosis not present

## 2016-09-07 DIAGNOSIS — L905 Scar conditions and fibrosis of skin: Secondary | ICD-10-CM | POA: Diagnosis not present

## 2016-09-07 DIAGNOSIS — C44319 Basal cell carcinoma of skin of other parts of face: Secondary | ICD-10-CM | POA: Diagnosis not present

## 2016-09-14 DIAGNOSIS — D0461 Carcinoma in situ of skin of right upper limb, including shoulder: Secondary | ICD-10-CM | POA: Diagnosis not present

## 2016-10-05 DIAGNOSIS — H34831 Tributary (branch) retinal vein occlusion, right eye, with macular edema: Secondary | ICD-10-CM | POA: Diagnosis not present

## 2016-10-26 DIAGNOSIS — H02053 Trichiasis without entropian right eye, unspecified eyelid: Secondary | ICD-10-CM | POA: Diagnosis not present

## 2016-11-11 DIAGNOSIS — Z23 Encounter for immunization: Secondary | ICD-10-CM | POA: Diagnosis not present

## 2016-11-16 DIAGNOSIS — H353211 Exudative age-related macular degeneration, right eye, with active choroidal neovascularization: Secondary | ICD-10-CM | POA: Diagnosis not present

## 2016-12-07 DIAGNOSIS — H353221 Exudative age-related macular degeneration, left eye, with active choroidal neovascularization: Secondary | ICD-10-CM | POA: Diagnosis not present

## 2017-01-15 DIAGNOSIS — X32XXXA Exposure to sunlight, initial encounter: Secondary | ICD-10-CM | POA: Diagnosis not present

## 2017-01-15 DIAGNOSIS — L821 Other seborrheic keratosis: Secondary | ICD-10-CM | POA: Diagnosis not present

## 2017-01-15 DIAGNOSIS — Z85828 Personal history of other malignant neoplasm of skin: Secondary | ICD-10-CM | POA: Diagnosis not present

## 2017-01-15 DIAGNOSIS — Z08 Encounter for follow-up examination after completed treatment for malignant neoplasm: Secondary | ICD-10-CM | POA: Diagnosis not present

## 2017-01-15 DIAGNOSIS — L570 Actinic keratosis: Secondary | ICD-10-CM | POA: Diagnosis not present

## 2017-02-22 DIAGNOSIS — H353212 Exudative age-related macular degeneration, right eye, with inactive choroidal neovascularization: Secondary | ICD-10-CM | POA: Diagnosis not present

## 2017-03-08 DIAGNOSIS — H353221 Exudative age-related macular degeneration, left eye, with active choroidal neovascularization: Secondary | ICD-10-CM | POA: Diagnosis not present

## 2017-04-19 DIAGNOSIS — H01003 Unspecified blepharitis right eye, unspecified eyelid: Secondary | ICD-10-CM | POA: Diagnosis not present

## 2017-05-17 DIAGNOSIS — H353211 Exudative age-related macular degeneration, right eye, with active choroidal neovascularization: Secondary | ICD-10-CM | POA: Diagnosis not present

## 2017-05-21 DIAGNOSIS — H353211 Exudative age-related macular degeneration, right eye, with active choroidal neovascularization: Secondary | ICD-10-CM | POA: Diagnosis not present

## 2017-05-21 DIAGNOSIS — H02053 Trichiasis without entropian right eye, unspecified eyelid: Secondary | ICD-10-CM | POA: Diagnosis not present

## 2017-05-21 DIAGNOSIS — H538 Other visual disturbances: Secondary | ICD-10-CM | POA: Diagnosis not present

## 2017-06-21 DIAGNOSIS — H353221 Exudative age-related macular degeneration, left eye, with active choroidal neovascularization: Secondary | ICD-10-CM | POA: Diagnosis not present

## 2017-07-26 ENCOUNTER — Encounter: Payer: Self-pay | Admitting: Family Medicine

## 2017-07-26 ENCOUNTER — Ambulatory Visit (INDEPENDENT_AMBULATORY_CARE_PROVIDER_SITE_OTHER): Payer: Medicare Other | Admitting: Family Medicine

## 2017-07-26 VITALS — BP 142/90 | HR 115 | Temp 98.8°F | Resp 16 | Wt 151.8 lb

## 2017-07-26 DIAGNOSIS — L03114 Cellulitis of left upper limb: Secondary | ICD-10-CM

## 2017-07-26 MED ORDER — CEPHALEXIN 500 MG PO CAPS: 500.0000 mg | ORAL_CAPSULE | Freq: Four times a day (QID) | ORAL | 0 refills | Status: DC

## 2017-07-26 NOTE — Patient Instructions (Addendum)
Continue EPSOM Salt soaks 1-2  X day. Pick up the medication at the pharmacy.

## 2017-07-26 NOTE — Progress Notes (Signed)
  Subjective:     Patient ID: Thomas Matthews, male   DOB: Jun 17, 1923, 84 y.o.   MRN: [id number] Chief Complaint  Patient presents with  . Hand Pain    Patient  comes in office today with complaint of redness and swelling of left hand. Patient states that he woke up this morning with swelling of his left hand, patient denies any trauma to skin or insect bite. Patient states bthat skin is warm to the touch and has difficulty closing hand.    HPI Denies fever or injury. States he does work in his garden. Has started salt water soaks. Hx of ankle cellulitis last year. Primary caregiver is the [facility].  Review of Systems     Objective:   Physical Exam  Constitutional: He appears well-developed and well-nourished. No distress.  HENT:  Hard of hearing  Skin:  Dorsum of left hand erythematous and tender with mild swelling. Extends to proximal fingers. No drainage or fluctuance. Small skin breaks/senile purpura noted       Assessment:    1. Cellulitis of left hand - cephALEXin (KEFLEX) 500 MG capsule; Take 1 capsule (500 mg total) by mouth 4 (four) times daily.  Dispense: 28 capsule; Refill: 0    Plan:    Discussed Epsom salt soaks and return in two days.

## 2017-07-27 DIAGNOSIS — K219 Gastro-esophageal reflux disease without esophagitis: Secondary | ICD-10-CM | POA: Insufficient documentation

## 2017-07-27 DIAGNOSIS — Z950 Presence of cardiac pacemaker: Secondary | ICD-10-CM | POA: Insufficient documentation

## 2017-07-27 DIAGNOSIS — I2510 Atherosclerotic heart disease of native coronary artery without angina pectoris: Secondary | ICD-10-CM | POA: Insufficient documentation

## 2017-07-27 DIAGNOSIS — K5790 Diverticulosis of intestine, part unspecified, without perforation or abscess without bleeding: Secondary | ICD-10-CM | POA: Insufficient documentation

## 2017-07-27 DIAGNOSIS — I4891 Unspecified atrial fibrillation: Secondary | ICD-10-CM | POA: Insufficient documentation

## 2017-07-27 DIAGNOSIS — IMO0002 Reserved for concepts with insufficient information to code with codable children: Secondary | ICD-10-CM | POA: Insufficient documentation

## 2017-07-27 DIAGNOSIS — I10 Essential (primary) hypertension: Secondary | ICD-10-CM | POA: Insufficient documentation

## 2017-07-27 DIAGNOSIS — L719 Rosacea, unspecified: Secondary | ICD-10-CM | POA: Insufficient documentation

## 2017-07-27 DIAGNOSIS — I4892 Unspecified atrial flutter: Secondary | ICD-10-CM

## 2017-07-28 ENCOUNTER — Other Ambulatory Visit: Payer: Self-pay | Admitting: Family Medicine

## 2017-07-28 ENCOUNTER — Encounter: Payer: Self-pay | Admitting: Family Medicine

## 2017-07-28 ENCOUNTER — Ambulatory Visit (INDEPENDENT_AMBULATORY_CARE_PROVIDER_SITE_OTHER): Payer: Medicare Other | Admitting: Family Medicine

## 2017-07-28 VITALS — BP 134/70 | HR 50 | Temp 97.7°F | Ht 67.0 in | Wt 153.2 lb

## 2017-07-28 DIAGNOSIS — L03114 Cellulitis of left upper limb: Secondary | ICD-10-CM | POA: Diagnosis not present

## 2017-07-28 DIAGNOSIS — S61411A Laceration without foreign body of right hand, initial encounter: Secondary | ICD-10-CM

## 2017-07-28 MED ORDER — SODIUM CHLORIDE 0.9 % EX SOLN: 2.0000 "application " | Freq: Two times a day (BID) | CUTANEOUS | 0 refills | Status: DC

## 2017-07-28 NOTE — Progress Notes (Signed)
  Subjective:     Patient ID: Thomas Matthews, male   DOB: 1924/02/13, 84 y.o.   MRN: [id number] Chief Complaint  Patient presents with  . Left hand Cellulitis    Patient presents for a 2 day follow up. Last OV was on 07/26/17. Patient started Keflex 500 mg QID and advised to use epsom salt soaks. He reports good complaince with treatment plan. He reports symptoms are gradually improving. He states he fell on 07/26/17 and injured his right hand and forearm.    HPI States his left hand cellulitis is improving with less tenderness and swelling Reports compliance with cephalexin 4 x day and Epsom salt soaks. He reports he slid out of his wife's leather chair and was unable to get back up without their neighbor's help. When he was scrabbling to get up he sustained skin tears to his right hand.  Review of Systems     Objective:   Physical Exam  Constitutional: He appears well-developed and well-nourished. No distress.  Skin:  Dorsum of right hand with two skin tears. Cleansed with saline and non-stick dressing applied Left hand with mildly decreased erythema but no longer tender.       Assessment:    1. Cellulitis of left hand: continue cephalexin and daily epsom salt soaks  2. Skin tear of right hand without complication, initial encounter - SODIUM CHLORIDE, EXTERNAL, ([employer] SALINE WOUND [place]) 0.9 % SOLN; Apply 2 application topically 2 (two) times daily.  Dispense: 210 mL; Refill: 0    Plan:    Discussed and wrote down treatment plan for twice daily saline cleansing of his skin tears with application of non-adherent dressing. Will f/u after completion of his abx.

## 2017-07-28 NOTE — Progress Notes (Signed)
RX called in at [facility]. Per pharmacist they are having issues with the e-scribing system

## 2017-07-28 NOTE — Patient Instructions (Signed)
Cleanse right hand tears with the prescription saline solution daily and apply non-stick dressing. Continue Epsom salt soaks to left hand once a day.

## 2017-08-03 ENCOUNTER — Ambulatory Visit (INDEPENDENT_AMBULATORY_CARE_PROVIDER_SITE_OTHER): Payer: Medicare Other | Admitting: Family Medicine

## 2017-08-03 ENCOUNTER — Encounter: Payer: Self-pay | Admitting: Family Medicine

## 2017-08-03 VITALS — BP 108/80 | HR 76 | Temp 97.5°F | Resp 16 | Wt 155.6 lb

## 2017-08-03 DIAGNOSIS — L03114 Cellulitis of left upper limb: Secondary | ICD-10-CM | POA: Diagnosis not present

## 2017-08-03 DIAGNOSIS — S61411A Laceration without foreign body of right hand, initial encounter: Secondary | ICD-10-CM

## 2017-08-03 NOTE — Patient Instructions (Signed)
Continue salt water in the bottle for your right hand skin tears until healed. For the skin of your hands and arms use a moisturizing cream like Eucerin (hypoallergenic without fragrance) once a day and after bathing.

## 2017-08-03 NOTE — Progress Notes (Signed)
  Subjective:     Patient ID: Thomas Matthews, male   DOB: April 29, 1923, 84 y.o.   MRN: [id number] Chief Complaint  Patient presents with  . Cellulitis    Patient returns to office today for one week follow up, patient reports today that pain and swelling have greatly improved in his left hand.    HPI He has been cleaning right hand skin tears as instructed with saline and has applied Neosporin abx with band-aid  Review of Systems     Objective:   Physical Exam  Constitutional: He appears well-developed and well-nourished. No distress.  Skin:  Left hand cellulitis resolved. Right dorsal hand skin tear without sign of infection; appear to be healing well.       Assessment:    1. Skin tear of right hand without complication, initial encounter: improving  2. Cellulitis of left hand: resolved    Plan:    F/u with [facility] 08/12/17. Continue current treatment to skin tear. Suggested use of moisturizing cream like Eucerin to keep skin pliable.

## 2017-08-31 DIAGNOSIS — H353211 Exudative age-related macular degeneration, right eye, with active choroidal neovascularization: Secondary | ICD-10-CM | POA: Diagnosis not present

## 2017-09-24 DIAGNOSIS — I350 Nonrheumatic aortic (valve) stenosis: Secondary | ICD-10-CM | POA: Diagnosis not present

## 2017-09-24 DIAGNOSIS — J811 Chronic pulmonary edema: Secondary | ICD-10-CM | POA: Diagnosis not present

## 2017-09-24 DIAGNOSIS — J90 Pleural effusion, not elsewhere classified: Secondary | ICD-10-CM | POA: Diagnosis not present

## 2017-09-24 DIAGNOSIS — Z952 Presence of prosthetic heart valve: Secondary | ICD-10-CM | POA: Diagnosis not present

## 2017-09-24 DIAGNOSIS — R918 Other nonspecific abnormal finding of lung field: Secondary | ICD-10-CM | POA: Diagnosis not present

## 2017-09-24 DIAGNOSIS — I517 Cardiomegaly: Secondary | ICD-10-CM | POA: Diagnosis not present

## 2017-09-28 DIAGNOSIS — I350 Nonrheumatic aortic (valve) stenosis: Secondary | ICD-10-CM | POA: Diagnosis not present

## 2017-09-28 DIAGNOSIS — I25119 Atherosclerotic heart disease of native coronary artery with unspecified angina pectoris: Secondary | ICD-10-CM | POA: Diagnosis not present

## 2017-11-02 DIAGNOSIS — Z23 Encounter for immunization: Secondary | ICD-10-CM | POA: Diagnosis not present

## 2017-11-07 DIAGNOSIS — R918 Other nonspecific abnormal finding of lung field: Secondary | ICD-10-CM | POA: Diagnosis not present

## 2017-11-07 DIAGNOSIS — I2510 Atherosclerotic heart disease of native coronary artery without angina pectoris: Secondary | ICD-10-CM | POA: Diagnosis present

## 2017-11-07 DIAGNOSIS — J90 Pleural effusion, not elsewhere classified: Secondary | ICD-10-CM | POA: Diagnosis not present

## 2017-11-07 DIAGNOSIS — I25119 Atherosclerotic heart disease of native coronary artery with unspecified angina pectoris: Secondary | ICD-10-CM | POA: Diagnosis not present

## 2017-11-07 DIAGNOSIS — I4891 Unspecified atrial fibrillation: Secondary | ICD-10-CM | POA: Diagnosis not present

## 2017-11-07 DIAGNOSIS — I495 Sick sinus syndrome: Secondary | ICD-10-CM | POA: Diagnosis present

## 2017-11-07 DIAGNOSIS — I272 Pulmonary hypertension, unspecified: Secondary | ICD-10-CM | POA: Diagnosis present

## 2017-11-07 DIAGNOSIS — I482 Chronic atrial fibrillation: Secondary | ICD-10-CM | POA: Diagnosis not present

## 2017-11-07 DIAGNOSIS — E785 Hyperlipidemia, unspecified: Secondary | ICD-10-CM | POA: Diagnosis present

## 2017-11-07 DIAGNOSIS — I771 Stricture of artery: Secondary | ICD-10-CM | POA: Diagnosis not present

## 2017-11-07 DIAGNOSIS — I517 Cardiomegaly: Secondary | ICD-10-CM | POA: Diagnosis not present

## 2017-11-07 DIAGNOSIS — I5032 Chronic diastolic (congestive) heart failure: Secondary | ICD-10-CM | POA: Diagnosis present

## 2017-11-07 DIAGNOSIS — I361 Nonrheumatic tricuspid (valve) insufficiency: Secondary | ICD-10-CM | POA: Diagnosis present

## 2017-11-07 DIAGNOSIS — Z006 Encounter for examination for normal comparison and control in clinical research program: Secondary | ICD-10-CM | POA: Diagnosis not present

## 2017-11-07 DIAGNOSIS — Z7982 Long term (current) use of aspirin: Secondary | ICD-10-CM | POA: Diagnosis not present

## 2017-11-07 DIAGNOSIS — Z950 Presence of cardiac pacemaker: Secondary | ICD-10-CM | POA: Diagnosis not present

## 2017-11-07 DIAGNOSIS — I10 Essential (primary) hypertension: Secondary | ICD-10-CM | POA: Diagnosis present

## 2017-11-07 DIAGNOSIS — K219 Gastro-esophageal reflux disease without esophagitis: Secondary | ICD-10-CM | POA: Diagnosis present

## 2017-11-07 DIAGNOSIS — Z95828 Presence of other vascular implants and grafts: Secondary | ICD-10-CM | POA: Diagnosis not present

## 2017-11-07 DIAGNOSIS — R54 Age-related physical debility: Secondary | ICD-10-CM | POA: Diagnosis not present

## 2017-11-07 DIAGNOSIS — I480 Paroxysmal atrial fibrillation: Secondary | ICD-10-CM | POA: Diagnosis present

## 2017-11-07 DIAGNOSIS — Z87891 Personal history of nicotine dependence: Secondary | ICD-10-CM | POA: Diagnosis not present

## 2017-11-07 DIAGNOSIS — N400 Enlarged prostate without lower urinary tract symptoms: Secondary | ICD-10-CM | POA: Diagnosis present

## 2017-11-07 DIAGNOSIS — I350 Nonrheumatic aortic (valve) stenosis: Secondary | ICD-10-CM | POA: Diagnosis present

## 2017-11-29 DIAGNOSIS — H353211 Exudative age-related macular degeneration, right eye, with active choroidal neovascularization: Secondary | ICD-10-CM | POA: Diagnosis not present

## 2017-12-09 DIAGNOSIS — I493 Ventricular premature depolarization: Secondary | ICD-10-CM | POA: Diagnosis not present

## 2017-12-09 DIAGNOSIS — I459 Conduction disorder, unspecified: Secondary | ICD-10-CM | POA: Diagnosis not present

## 2017-12-09 DIAGNOSIS — I081 Rheumatic disorders of both mitral and tricuspid valves: Secondary | ICD-10-CM | POA: Diagnosis not present

## 2017-12-09 DIAGNOSIS — I350 Nonrheumatic aortic (valve) stenosis: Secondary | ICD-10-CM | POA: Diagnosis not present

## 2017-12-09 DIAGNOSIS — J90 Pleural effusion, not elsewhere classified: Secondary | ICD-10-CM | POA: Diagnosis not present

## 2017-12-09 DIAGNOSIS — I480 Paroxysmal atrial fibrillation: Secondary | ICD-10-CM | POA: Diagnosis not present

## 2017-12-09 DIAGNOSIS — I4891 Unspecified atrial fibrillation: Secondary | ICD-10-CM | POA: Diagnosis not present

## 2017-12-09 DIAGNOSIS — I2510 Atherosclerotic heart disease of native coronary artery without angina pectoris: Secondary | ICD-10-CM | POA: Diagnosis not present

## 2017-12-09 DIAGNOSIS — I10 Essential (primary) hypertension: Secondary | ICD-10-CM | POA: Diagnosis not present

## 2017-12-09 DIAGNOSIS — Z952 Presence of prosthetic heart valve: Secondary | ICD-10-CM | POA: Diagnosis not present

## 2017-12-09 DIAGNOSIS — R9431 Abnormal electrocardiogram [ECG] [EKG]: Secondary | ICD-10-CM | POA: Diagnosis not present

## 2018-02-21 DIAGNOSIS — Z85828 Personal history of other malignant neoplasm of skin: Secondary | ICD-10-CM | POA: Diagnosis not present

## 2018-02-21 DIAGNOSIS — Z08 Encounter for follow-up examination after completed treatment for malignant neoplasm: Secondary | ICD-10-CM | POA: Diagnosis not present

## 2018-02-21 DIAGNOSIS — X32XXXA Exposure to sunlight, initial encounter: Secondary | ICD-10-CM | POA: Diagnosis not present

## 2018-02-21 DIAGNOSIS — D485 Neoplasm of uncertain behavior of skin: Secondary | ICD-10-CM | POA: Diagnosis not present

## 2018-02-21 DIAGNOSIS — C44229 Squamous cell carcinoma of skin of left ear and external auricular canal: Secondary | ICD-10-CM | POA: Diagnosis not present

## 2018-02-21 DIAGNOSIS — L570 Actinic keratosis: Secondary | ICD-10-CM | POA: Diagnosis not present

## 2018-02-21 DIAGNOSIS — R58 Hemorrhage, not elsewhere classified: Secondary | ICD-10-CM | POA: Diagnosis not present

## 2018-02-21 DIAGNOSIS — C44329 Squamous cell carcinoma of skin of other parts of face: Secondary | ICD-10-CM | POA: Diagnosis not present

## 2018-02-28 DIAGNOSIS — H353211 Exudative age-related macular degeneration, right eye, with active choroidal neovascularization: Secondary | ICD-10-CM | POA: Diagnosis not present

## 2018-03-22 DIAGNOSIS — C44329 Squamous cell carcinoma of skin of other parts of face: Secondary | ICD-10-CM | POA: Diagnosis not present

## 2018-03-29 DIAGNOSIS — C44229 Squamous cell carcinoma of skin of left ear and external auricular canal: Secondary | ICD-10-CM | POA: Diagnosis not present

## 2018-04-04 DIAGNOSIS — L309 Dermatitis, unspecified: Secondary | ICD-10-CM | POA: Diagnosis not present

## 2018-04-04 DIAGNOSIS — L923 Foreign body granuloma of the skin and subcutaneous tissue: Secondary | ICD-10-CM | POA: Diagnosis not present

## 2018-07-25 DIAGNOSIS — H353221 Exudative age-related macular degeneration, left eye, with active choroidal neovascularization: Secondary | ICD-10-CM | POA: Diagnosis not present

## 2018-07-25 DIAGNOSIS — H34831 Tributary (branch) retinal vein occlusion, right eye, with macular edema: Secondary | ICD-10-CM | POA: Diagnosis not present

## 2018-09-05 ENCOUNTER — Other Ambulatory Visit: Payer: Self-pay

## 2018-09-05 ENCOUNTER — Encounter: Payer: Self-pay | Admitting: Family Medicine

## 2018-09-05 ENCOUNTER — Telehealth: Payer: Self-pay | Admitting: Family Medicine

## 2018-09-05 ENCOUNTER — Ambulatory Visit (INDEPENDENT_AMBULATORY_CARE_PROVIDER_SITE_OTHER): Payer: Medicare Other | Admitting: Family Medicine

## 2018-09-05 VITALS — BP 134/70 | HR 80 | Temp 98.3°F | Resp 16 | Wt 143.0 lb

## 2018-09-05 DIAGNOSIS — S76211A Strain of adductor muscle, fascia and tendon of right thigh, initial encounter: Secondary | ICD-10-CM | POA: Diagnosis not present

## 2018-09-05 NOTE — Patient Instructions (Signed)
.   Please review the attached list of medications and notify my office if there are any errors.   . Please bring all of your medications to every appointment so we can make sure that our medication list is the same as yours.   . We will have flu vaccines available after Labor Day. Please go to your pharmacy or call the office in early September to schedule you flu shot.   

## 2018-09-05 NOTE — Telephone Encounter (Signed)
Pt has a pain in his groin area on the right side. No knot  CB# [phone]  thanks teri

## 2018-09-05 NOTE — Progress Notes (Signed)
       Patient: Thomas Matthews Male    DOB: 12/20/1923   85 y.o.   MRN: [id number] Visit Date: 09/05/2018  Today's Provider: Lelon Huh, MD   Chief Complaint  Patient presents with  . Groin Pain   Subjective:     Groin Pain This is a new problem. The current episode started in the past 7 days (about 4 days ago). The problem has been gradually worsening. The pain is moderate. Pertinent negatives include no frequency or rash. The symptoms are aggravated by activity (walking). He has tried nothing for the symptoms.   Patient feels that symptoms could have come from heavy lifting. He reports that he was out doing yard work, and he has had symptoms ever since. Pain gets better when he lies down.  No Known Allergies   Current Outpatient Medications:  .  aspirin 81 MG tablet, Take by mouth., Disp: , Rfl:  .  calcium citrate-vitamin D (CITRACAL+D) 315-200 MG-UNIT tablet, Take by mouth., Disp: , Rfl:  .  captopril (CAPOTEN) 12.5 MG tablet, Take 12.5 mg by mouth daily at 2 PM., Disp: , Rfl:  .  diltiazem (CARDIZEM) 30 MG tablet, Take by mouth., Disp: , Rfl:  .  furosemide (LASIX) 20 MG tablet, Take 20 mg by mouth., Disp: , Rfl:  .  omeprazole (PRILOSEC) 20 MG capsule, Take by mouth., Disp: , Rfl:  .  oxybutynin (DITROPAN XL) 10 MG 24 hr tablet, Take 10 mg by mouth at bedtime., Disp: , Rfl:  .  pravastatin (PRAVACHOL) 20 MG tablet, Take by mouth., Disp: , Rfl:  .  SODIUM CHLORIDE, EXTERNAL, ([facility] SALINE WOUND [place]) 0.9 % SOLN, Apply 2 application topically 2 (two) times daily., Disp: 210 mL, Rfl: 0 .  tamsulosin (FLOMAX) 0.4 MG CAPS capsule, Take by mouth., Disp: , Rfl:   Review of Systems  Genitourinary: Negative for frequency.  Skin: Negative for rash.    Social History   Tobacco Use  . Smoking status: Former Research scientist (life sciences)  . Smokeless tobacco: Never Used  Substance Use Topics  . Alcohol use: No      Objective:   BP 134/70   Pulse 80   Temp 98.3 F (36.8 C)   Resp 16   Wt  143 lb (64.9 kg)   SpO2 100%   BMI 22.40 kg/m  Vitals:   09/05/18 1057  BP: 134/70  Pulse: 80  Resp: 16  Temp: 98.3 F (36.8 C)  SpO2: 100%  Weight: 143 lb (64.9 kg)     Physical Exam  General appearance: alert, well developed, well nourished, cooperative and in no distress Head: Normocephalic, without obvious abnormality, atraumatic Respiratory: Respirations even and unlabored, normal respiratory rate MS: Slight tender firm know in right proximal inner thigh consistent with muscle strain. No hernias, no erythema. No other gross deformities.       Assessment & Plan    1. Groin strain, right, initial encounter Counseled on typical course, may take 2-4 more weeks to heal. Can apply heat for about 5 minutes 2-3 times a day. Call if any worsening or if not greatly improved ina month.   The entirety of the information documented in the History of Present Illness, Review of Systems and Physical Exam were personally obtained by me. Portions of this information were initially documented by Wilburt Finlay, CMA and reviewed by me for thoroughness and accuracy.      Lelon Huh, MD  [facility]

## 2018-09-05 NOTE — Telephone Encounter (Signed)
Pt walked in for an appointment.    Thanks,   -Mickel Baas

## 2018-10-17 DIAGNOSIS — H353211 Exudative age-related macular degeneration, right eye, with active choroidal neovascularization: Secondary | ICD-10-CM | POA: Diagnosis not present

## 2018-10-22 DIAGNOSIS — Z23 Encounter for immunization: Secondary | ICD-10-CM | POA: Diagnosis not present

## 2018-12-23 DIAGNOSIS — I493 Ventricular premature depolarization: Secondary | ICD-10-CM | POA: Diagnosis not present

## 2018-12-23 DIAGNOSIS — Z952 Presence of prosthetic heart valve: Secondary | ICD-10-CM | POA: Diagnosis not present

## 2018-12-23 DIAGNOSIS — R9431 Abnormal electrocardiogram [ECG] [EKG]: Secondary | ICD-10-CM | POA: Diagnosis not present

## 2018-12-23 DIAGNOSIS — I2510 Atherosclerotic heart disease of native coronary artery without angina pectoris: Secondary | ICD-10-CM | POA: Diagnosis not present

## 2018-12-23 DIAGNOSIS — I10 Essential (primary) hypertension: Secondary | ICD-10-CM | POA: Diagnosis not present

## 2018-12-23 DIAGNOSIS — I480 Paroxysmal atrial fibrillation: Secondary | ICD-10-CM | POA: Diagnosis not present

## 2018-12-23 DIAGNOSIS — I4891 Unspecified atrial fibrillation: Secondary | ICD-10-CM | POA: Diagnosis not present

## 2018-12-23 DIAGNOSIS — I083 Combined rheumatic disorders of mitral, aortic and tricuspid valves: Secondary | ICD-10-CM | POA: Diagnosis not present

## 2019-01-09 DIAGNOSIS — H353211 Exudative age-related macular degeneration, right eye, with active choroidal neovascularization: Secondary | ICD-10-CM | POA: Diagnosis not present

## 2019-05-03 ENCOUNTER — Telehealth: Payer: Self-pay | Admitting: Family Medicine

## 2019-05-03 NOTE — Telephone Encounter (Signed)
Please see below daughter is listed on DPR. KW

## 2019-05-03 NOTE — Telephone Encounter (Signed)
Pt's daughter is requesting a call back to discuss his visit.  She cannot come to the visit on Thurs. Pt cannot remember what will be said or instructions.  Please call Ferman Hamming (Daughter - care taker) at [phone].  Thanks, [employer]

## 2019-05-04 ENCOUNTER — Other Ambulatory Visit: Payer: Self-pay

## 2019-05-04 ENCOUNTER — Encounter: Payer: Self-pay | Admitting: Adult Health

## 2019-05-04 ENCOUNTER — Ambulatory Visit (INDEPENDENT_AMBULATORY_CARE_PROVIDER_SITE_OTHER): Payer: Medicare Other | Admitting: Adult Health

## 2019-05-04 VITALS — BP 130/80 | HR 72 | Temp 97.3°F | Resp 16 | Wt 153.4 lb

## 2019-05-04 DIAGNOSIS — K4091 Unilateral inguinal hernia, without obstruction or gangrene, recurrent: Secondary | ICD-10-CM | POA: Diagnosis not present

## 2019-05-04 NOTE — Progress Notes (Signed)
Patient: Thomas Matthews Male    DOB: [birth year]   +    MRN: [id number] Visit Date: 05/04/2019  Today's Provider: Marcille Buffy, FNP   Chief Complaint  Patient presents with  . Skin Problem   Subjective:     HPI Patient presents in office today with concerns of abdominal hernia that he states has been present for one year. Patient denies any abdominal pain or discomfort when pressing on abdomen but states that he can see it, when flexing or bending.   He has had a bulge for around a year in his lower groin, he says it improves at night with lying down and gets larger as the day goes on.  He has no pain. No straning with bowel movements, normal; bowel movements. Before this happened he had been helping pick up a lawnmower.Present for year.  Denies any nausea. Denies any black stools or blood. He reports he has a large bowel movement daily.  Denies any abdominal pain.   Patient  denies any fever, body aches,chills, rash, chest pain, shortness of breath, nausea, vomiting, or diarrhea.   Daughter accompanies him to visit.  Please call Ferman Hamming (Daughter - care taker) at [phone] if needed.   No Known Allergies   Current Outpatient Medications:  .  apixaban (ELIQUIS) 2.5 MG TABS tablet, Take by mouth., Disp: , Rfl:  .  aspirin 81 MG tablet, Take by mouth., Disp: , Rfl:  .  calcium citrate-vitamin D (CITRACAL+D) 315-200 MG-UNIT tablet, Take by mouth., Disp: , Rfl:  .  captopril (CAPOTEN) 12.5 MG tablet, Take 12.5 mg by mouth daily at 2 PM., Disp: , Rfl:  .  diltiazem (CARDIZEM) 30 MG tablet, Take by mouth., Disp: , Rfl:  .  furosemide (LASIX) 20 MG tablet, Take 20 mg by mouth., Disp: , Rfl:  .  omeprazole (PRILOSEC) 20 MG capsule, Take by mouth., Disp: , Rfl:  .  oxybutynin (DITROPAN XL) 10 MG 24 hr tablet, Take 10 mg by mouth at bedtime., Disp: , Rfl:  .  pravastatin (PRAVACHOL) 20 MG tablet, Take by mouth., Disp: , Rfl:  .  tamsulosin (FLOMAX) 0.4 MG CAPS  capsule, Take by mouth., Disp: , Rfl:   Review of Systems  Constitutional: Negative.  Negative for activity change, appetite change, chills, diaphoresis, fatigue, fever and unexpected weight change.  Respiratory: Negative.   Cardiovascular: Negative.   Gastrointestinal: Positive for abdominal distention (in groin x 1 year ).  Neurological: Negative for dizziness, light-headedness and headaches.      Social History   Tobacco Use  . Smoking status: Former Research scientist (life sciences)  . Smokeless tobacco: Never Used  Substance Use Topics  . Alcohol use: No      Objective:     Today's Vitals   05/04/19 1109  BP: 130/80  Pulse: 72  Resp: 16  Temp: (!) 97.3 F (36.3 C)  TempSrc: Temporal  SpO2: 96%  Weight: 153 lb 6.4 oz (69.6 kg)   Body mass index is 24.03 kg/m.  Body mass index is 24.03 kg/m.   Physical Exam Vitals reviewed.  Abdominal:     General: Bowel sounds are normal.     Palpations: Abdomen is soft.     Hernia: A hernia is present. Hernia is present in the right inguinal area (large and full ).       Comments: No skin discoloration, fullness in area.   Skin:    Capillary Refill: Capillary refill takes less than 2 seconds.  Neurological:     Mental Status: He is oriented to person, place, and time.  Psychiatric:        Mood and Affect: Mood normal.        Behavior: Behavior normal.        Thought Content: Thought content normal.        Judgment: Judgment normal.      No results found for any visits on 05/04/19.     Assessment & Plan    1. Unilateral recurrent inguinal hernia without obstruction or gangrene-   - Ambulatory referral to General Surgery Discussed RED flags and when to seek medical attention.  Please call Ferman Hamming (Daughter - care taker) at [phone]. She is in agreement with plan as is patient. Dr. Rosanna Randy did come in exam room at provider request to evaluate hernia given size and fullness and is in agreement with plan.      The entirety of  the information documented in the History of Present Illness, Review of Systems and Physical Exam were personally obtained by me. Portions of this information were initially documented by the  Certified Medical Assistant whose name is documented in [place] and reviewed by me for thoroughness and accuracy.  I have personally performed the exam and reviewed the chart and it is accurate to the best of my knowledge.  Haematologist has been used and any errors in dictation or transcription are unintentional.  Kelby Aline. [place], [facility]

## 2019-05-04 NOTE — Patient Instructions (Signed)
° °Inguinal Hernia, Adult °An inguinal hernia is when fat or your intestines push through a weak spot in a muscle where your leg meets your lower belly (groin). This causes a rounded lump (bulge). This kind of hernia could also be: °· In your scrotum, if you are male. °· In folds of skin around your vagina, if you are male. °There are three types of inguinal hernias. These include: °· Hernias that can be pushed back into the belly (are reducible). This type rarely causes pain. °· Hernias that cannot be pushed back into the belly (are incarcerated). °· Hernias that cannot be pushed back into the belly and lose their blood supply (are strangulated). This type needs emergency surgery. °If you do not have symptoms, you may not need treatment. If you have symptoms or a large hernia, you may need surgery. °Follow these instructions at home: °Lifestyle °· Do these things if told by your doctor so you do not have trouble pooping (constipation): °? Drink enough fluid to keep your pee (urine) pale yellow. °? Eat foods that have a lot of fiber. These include fresh fruits and vegetables, whole grains, and beans. °? Limit foods that are high in fat and processed sugars. These include foods that are fried or sweet. °? Take medicine for trouble pooping. °· Avoid lifting heavy objects. °· Avoid standing for long amounts of time. °· Do not use any products that contain nicotine or tobacco. These include cigarettes and e-cigarettes. If you need help quitting, ask your doctor. °· Stay at a healthy weight. °General instructions °· You may try to push your hernia in by very gently pressing on it when you are lying down. Do not try to force the bulge back in if it will not push in easily. °· Watch your hernia for any changes in shape, size, or color. Tell your doctor if you see any changes. °· Take over-the-counter and prescription medicines only as told by your doctor. °· Keep all follow-up visits as told by your doctor. This is  important. °Contact a doctor if: °· You have a fever. °· You have new symptoms. °· Your symptoms get worse. °Get help right away if: °· The area where your leg meets your lower belly has: °? Pain that gets worse suddenly. °? A bulge that gets bigger suddenly, and it does not get smaller after that. °? A bulge that turns red or purple. °? A bulge that is painful when you touch it. °· You are a man, and your scrotum: °? Suddenly feels painful. °? Suddenly changes in size. °· You cannot push the hernia in by very gently pressing on it when you are lying down. Do not try to force the bulge back in if it will not push in easily. °· You feel sick to your stomach (nauseous), and that feeling does not go away. °· You throw up (vomit), and that keeps happening. °· You have a fast heartbeat. °· You cannot poop (have a bowel movement) or pass gas. °These symptoms may be an emergency. Do not wait to see if the symptoms will go away. Get medical help right away. Call your local emergency services (911 in the [place] °Summary °· An inguinal hernia is when fat or your intestines push through a weak spot in a muscle where your leg meets your lower belly (groin). This causes a rounded lump (bulge). °· If you do not have symptoms, you may not need treatment. If you have symptoms or a large hernia,   you may need surgery. °· Avoid lifting heavy objects. Also avoid standing for long amounts of time. °· Do not try to force the bulge back in if it will not push in easily. °This information is not intended to replace advice given to you by your health care provider. Make sure you discuss any questions you have with your health care provider. °Document Revised: 03/06/2017 Document Reviewed: 11/04/2016 °Elsevier Patient Education © 2020 Elsevier Inc. ° °

## 2019-05-04 NOTE — Telephone Encounter (Signed)
Done. Daughter was at visit- no need to call.

## 2019-08-09 NOTE — Progress Notes (Signed)
Established patient visit   Patient: Thomas Matthews   DOB: [birth year]   86 y.o. Male  MRN: [id number] Visit Date: 08/10/2019  Today's healthcare provider: Trinna Post, PA-C   Chief Complaint  Patient presents with  . Fall   Esperanza Heir Walston,acting as a Education administrator for [employer], PA-C.,have documented all relevant documentation on the behalf of Trinna Post, PA-C,as directed by  Trinna Post, PA-C while in the presence of Trinna Post, PA-C.  Subjective    Fall Incident onset: 3 weeks ago. Fall occurred: slipped getting out the shower. He landed on hard floor. There was no blood loss. Point of impact: right lower leg. Associated symptoms comments: Redness, swelling, drainage, and soreness. Treatments tried: bandage. The treatment provided no relief.   Patient concerned because of wound on right anterior leg. He is on Eliquis. He denies fevers, chills, N/V.     Medications: Outpatient Medications Prior to Visit  Medication Sig  . apixaban (ELIQUIS) 2.5 MG TABS tablet Take by mouth.  . calcium citrate-vitamin D (CITRACAL+D) 315-200 MG-UNIT tablet Take by mouth.  . captopril (CAPOTEN) 12.5 MG tablet Take 12.5 mg by mouth daily at 2 PM.  . diltiazem (CARDIZEM) 30 MG tablet Take by mouth.  . furosemide (LASIX) 20 MG tablet Take 20 mg by mouth.  Marland Kitchen omeprazole (PRILOSEC) 20 MG capsule Take by mouth.  . oxybutynin (DITROPAN XL) 10 MG 24 hr tablet Take 10 mg by mouth at bedtime.  . pravastatin (PRAVACHOL) 20 MG tablet Take by mouth.  . tamsulosin (FLOMAX) 0.4 MG CAPS capsule Take by mouth.  . [DISCONTINUED] aspirin 81 MG tablet Take by mouth.   No facility-administered medications prior to visit.    Review of Systems    Objective    BP (!) 154/85 (BP Location: Left Arm, Patient Position: Sitting, Cuff Size: Normal)   Pulse 88   Temp (!) 97.3 F (36.3 C) (Temporal)   Ht 5\' 7"  (1.702 m)   Wt 152 lb 3.2 oz (69 kg)   BMI 23.84 kg/m    Physical Exam  Constitutional:      Appearance: Normal appearance.  Cardiovascular:     Rate and Rhythm: Normal rate and regular rhythm.     Pulses: Normal pulses.     Heart sounds: Normal heart sounds.  Pulmonary:     Effort: Pulmonary effort is normal.     Breath sounds: Normal breath sounds.  Skin:    General: Skin is warm and dry.  Neurological:     General: No focal deficit present.     Mental Status: He is alert and oriented to person, place, and time.  Psychiatric:        Mood and Affect: Mood normal.        Behavior: Behavior normal.     Media Information   Document Information  Photos  Right leg wound  08/10/2019 15:56  Attached To:  Office Visit on 08/10/19 with Trinna Post, PA-C  Source Information  Trinna Post, PA-C  [facility]     No results found for any visits on 08/10/19.  Assessment & Plan    1. Fall, initial encounter  - DG Ankle Complete Right; Future - DG Tibia/Fibula Right; Future  2. Wound infection  - doxycycline (VIBRA-TABS) 100 MG tablet; Take 1 tablet (100 mg total) by mouth 2 (two) times daily for 7 days.  Dispense: 14 tablet; Refill: 0 - DG Ankle Complete Right; Future -  DG Tibia/Fibula Right; Future - Tdap vaccine greater than or equal to 7yo IM    No follow-ups on file.      ITrinna Post, PA-C, have reviewed all documentation for this visit. The documentation on 09/07/19 for the exam, diagnosis, procedures, and orders are all accurate and complete.    Paulene Floor  [facility] [phone] (phone) [phone] (fax)  [place]

## 2019-08-10 ENCOUNTER — Ambulatory Visit
Admission: RE | Admit: 2019-08-10 | Discharge: 2019-08-10 | Disposition: A | Discharge status: Home / Self Care | Payer: Medicare Other | Attending: Physician Assistant | Admitting: Physician Assistant

## 2019-08-10 ENCOUNTER — Encounter: Payer: Self-pay | Admitting: Physician Assistant

## 2019-08-10 ENCOUNTER — Ambulatory Visit (INDEPENDENT_AMBULATORY_CARE_PROVIDER_SITE_OTHER): Payer: Medicare Other | Admitting: Physician Assistant

## 2019-08-10 ENCOUNTER — Encounter (INDEPENDENT_AMBULATORY_CARE_PROVIDER_SITE_OTHER): Payer: Self-pay

## 2019-08-10 ENCOUNTER — Ambulatory Visit
Admission: RE | Admit: 2019-08-10 | Discharge: 2019-08-10 | Disposition: A | Discharge status: Home / Self Care | Payer: Medicare Other | Source: Ambulatory Visit | Attending: Physician Assistant | Admitting: Physician Assistant

## 2019-08-10 ENCOUNTER — Other Ambulatory Visit: Payer: Self-pay

## 2019-08-10 VITALS — BP 154/85 | HR 88 | Temp 97.3°F | Ht 67.0 in | Wt 152.2 lb

## 2019-08-10 DIAGNOSIS — Z23 Encounter for immunization: Secondary | ICD-10-CM | POA: Diagnosis not present

## 2019-08-10 DIAGNOSIS — L089 Local infection of the skin and subcutaneous tissue, unspecified: Secondary | ICD-10-CM | POA: Diagnosis not present

## 2019-08-10 DIAGNOSIS — M79661 Pain in right lower leg: Secondary | ICD-10-CM | POA: Insufficient documentation

## 2019-08-10 DIAGNOSIS — Y999 Unspecified external cause status: Secondary | ICD-10-CM | POA: Insufficient documentation

## 2019-08-10 DIAGNOSIS — T148XXA Other injury of unspecified body region, initial encounter: Secondary | ICD-10-CM | POA: Diagnosis not present

## 2019-08-10 DIAGNOSIS — W19XXXA Unspecified fall, initial encounter: Secondary | ICD-10-CM

## 2019-08-10 DIAGNOSIS — Y939 Activity, unspecified: Secondary | ICD-10-CM | POA: Insufficient documentation

## 2019-08-10 DIAGNOSIS — M7989 Other specified soft tissue disorders: Secondary | ICD-10-CM | POA: Diagnosis not present

## 2019-08-10 DIAGNOSIS — Y929 Unspecified place or not applicable: Secondary | ICD-10-CM | POA: Diagnosis not present

## 2019-08-10 MED ORDER — DOXYCYCLINE HYCLATE 100 MG PO TABS: 100.0000 mg | ORAL_TABLET | Freq: Two times a day (BID) | ORAL | 0 refills | Status: AC

## 2019-08-11 ENCOUNTER — Telehealth: Payer: Self-pay

## 2019-08-11 NOTE — Telephone Encounter (Signed)
Copied from [place] [id number]. Topic: General - Other >> Aug 11, 2019  3:55 PM Hinda Lenis D wrote: Reason for CRM Pts daughter need to speak with a nurse about her dad appt / please advise

## 2019-08-11 NOTE — Telephone Encounter (Signed)
Please review. Patient was seen by [facility] yesterday.

## 2019-08-14 ENCOUNTER — Telehealth: Payer: Self-pay

## 2019-08-14 NOTE — Telephone Encounter (Signed)
Spoke with patients daughter Neoma Laming on phone and advised her or recent images. She wanted to let you know that she has been changing patients dressing and he no longer has drainage from site. She states that redness has cleared and states that patient only complains of slight tenderness when applying pressure to top of foot. I advised her to keep an eye out for patients symptoms and if he complained of pain to contact our office back and we will put in order for xray. KW

## 2019-08-14 NOTE — Telephone Encounter (Signed)
-----   Message from Trinna Post, [place] sent at 08/14/2019  1:52 PM EDT ----- There is soft tissue swelling where he fell. His leg bones and ankle bones are in tact. There is a questionable fracture on the top of his right foot. I do not remember him being tender in this area. If he is, the radiologist recommends getting a dedicated foot xray and I am happy to order this if patient/daughter would like.

## 2019-08-23 ENCOUNTER — Telehealth: Payer: Self-pay

## 2019-08-23 NOTE — Telephone Encounter (Signed)
Patient daughter Neoma Laming called back say that she can do a phone visit with patient on the 3 way if ok with the office. She would like a call back please  at Ph# [phone]

## 2019-08-23 NOTE — Progress Notes (Signed)
Virtual telephone visit    Virtual Visit via Telephone Note   This visit type was conducted due to national recommendations for restrictions regarding the COVID-19 Pandemic (e.g. social distancing) in an effort to limit this patient's exposure and mitigate transmission in our community. Due to his co-morbid illnesses, this patient is at least at moderate risk for complications without adequate follow up. This format is felt to be most appropriate for this patient at this time. The patient did not have access to video technology or had technical difficulties with video requiring transitioning to audio format only (telephone). Physical exam was limited to content and character of the telephone converstion.    Patient location: At home in conference call with daughter [name] - [place]). Provider location: Office   Visit Date: 08/24/2019  Today's healthcare provider: Vernie Murders, PA   Chief Complaint  Patient presents with  . URI   I,Latasha Walston,acting as a Education administrator for [employer], PA.,have documented all relevant documentation on the behalf of [employer], PA,as directed by  [employer], PA while in the presence of [employer], [place].  Subjective    URI  This is a new problem. Episode onset: Sunday. The problem has been gradually improving. There has been no fever. Associated symptoms include congestion, coughing and a sore throat. Treatments tried: OTC cold medication  The treatment provided mild relief.    History reviewed. No pertinent past medical history.   History reviewed. No pertinent surgical history.   Social History   Tobacco Use  . Smoking status: Former Research scientist (life sciences)  . Smokeless tobacco: Never Used  Substance Use Topics  . Alcohol use: No  . Drug use: No   No Known Allergies    Medications: Outpatient Medications Prior to Visit  Medication Sig  . apixaban (ELIQUIS) 2.5 MG TABS tablet Take by mouth.  . calcium citrate-vitamin D (CITRACAL+D)  315-200 MG-UNIT tablet Take by mouth.  . captopril (CAPOTEN) 12.5 MG tablet Take 12.5 mg by mouth daily at 2 PM.  . diltiazem (CARDIZEM) 30 MG tablet Take by mouth.  . furosemide (LASIX) 20 MG tablet Take 20 mg by mouth.  Marland Kitchen omeprazole (PRILOSEC) 20 MG capsule Take by mouth.  . oxybutynin (DITROPAN XL) 10 MG 24 hr tablet Take 10 mg by mouth at bedtime.  . pravastatin (PRAVACHOL) 20 MG tablet Take by mouth.  . tamsulosin (FLOMAX) 0.4 MG CAPS capsule Take by mouth.   No facility-administered medications prior to visit.    Review of Systems  Constitutional: Negative.   HENT: Positive for congestion and sore throat.   Respiratory: Positive for cough.   Cardiovascular: Negative.   Musculoskeletal: Negative.     Objective    There were no vitals taken for this visit.  Physical: No acute respiratory distress during telephonic interview.   Assessment & Plan    1. Nasal congestion Onset over the past weekend without fever, headache or earache. Tried some Tylenol with a throat lozenge and daughter states he sounds better now. Increase fluid intake and may add Mucinex for congestion. Recheck if no better in 3-4 days. No COVID-19 exposure.  2. Post-nasal drip Developed PND over the past 4-5 days without fever, body aches, sneezing or headache. Used some Chloroseptic Lozenges and feeling better today. Monitor for more symptoms or worsening. Recheck prn.  No follow-ups on file.    I discussed the assessment and treatment plan with the patient. The patient was provided an opportunity to ask questions and all were answered.  The patient agreed with the plan and demonstrated an understanding of the instructions.   The patient was advised to call back or seek an in-person evaluation if the symptoms worsen or if the condition fails to improve as anticipated.  I provided 15 minutes of non-face-to-face time during this encounter.  Andres Shad, PA, have reviewed all documentation for this  visit. The documentation on 08/24/19 for the exam, diagnosis, procedures, and orders are all accurate and complete.   Vernie Murders, [place] [phone] (phone)  [phone] (fax)  [place]

## 2019-08-23 NOTE — Telephone Encounter (Signed)
Attempted to contact patient's daughter no answer left a  voicemail. Okay for PEC to advise daughter that the only open appointment for tomorrow is at 3 with [facility]. The appointment must be virtual due to patient's symptoms.

## 2019-08-23 NOTE — Telephone Encounter (Signed)
Copied from [place] [id number]. Topic: General - Other >> Aug 23, 2019 10:50 AM Leward Quan A wrote: Reason for CRM: Patient daughter Deborah/ POA  called to say that the patient have a cold with a productive cough asking for an appointment tomorrow 08/24/19 only available is 08/25/19 she is asking for a call back on Ph# [phone]. Per Neoma Laming patient lives on his own but she help with his appointments and will be in town on 08/24/19

## 2019-08-24 ENCOUNTER — Encounter: Payer: Self-pay | Admitting: Family Medicine

## 2019-08-24 ENCOUNTER — Ambulatory Visit (INDEPENDENT_AMBULATORY_CARE_PROVIDER_SITE_OTHER): Payer: Medicare Other | Admitting: Family Medicine

## 2019-08-24 DIAGNOSIS — R0981 Nasal congestion: Secondary | ICD-10-CM | POA: Diagnosis not present

## 2019-08-24 DIAGNOSIS — R0982 Postnasal drip: Secondary | ICD-10-CM | POA: Diagnosis not present

## 2019-09-04 ENCOUNTER — Ambulatory Visit: Payer: Self-pay | Admitting: Family Medicine

## 2019-09-04 NOTE — Telephone Encounter (Signed)
Please advise 

## 2019-09-04 NOTE — Telephone Encounter (Signed)
Patients daughter advised and verbalized understanding.  

## 2019-09-04 NOTE — Telephone Encounter (Signed)
Pt's daughter calling , pt present. States pt fell Thursday sustained "Skin tear" right forearm. 1/2in to 2in's, no flap present. States bleed initially, no bleeding presently, "Red." Calling for advise regarding how to treat. States pt "Keeps wanting to put Vaseline on it." Also has 2 small abrasions between left hand index and middle fingers. Daughter states no other injuries from fall. Ambulating with cane which is baseline. Daughter has to leave pts home and wants to know how to dress wound before she leaves. Home care advise given. Assured NT would route to practice for Dr. Maralyn Sago review.  Please call '[name]'  For any additional care advise:  [phone]  Reason for Disposition . Minor cut or scratch  Answer Assessment - Initial Assessment Questions 1. APPEARANCE of INJURY: "What does the injury look like?"       skin tear, no flap 2. SIZE: "How large is the cut?"        1/2 in to 2in skin tear 3. BLEEDING: "Is it bleeding now?" If Yes, ask: "Is it difficult to stop?"  No    4. LOCATION: "Where is the injury located?"      Right forearm 5. ONSET: "How long ago did the injury occur?"      Thursday 6. MECHANISM: "Tell me how it happened."     Pt fell 7. TETANUS: "When was the last tetanus booster?" Unsure  Protocols used: [place]

## 2019-09-04 NOTE — Telephone Encounter (Signed)
Should clean with sterile saline daily and dress either with Telfa pad or vaseline guaze which should be able to find at pharmacy.

## 2019-09-28 ENCOUNTER — Ambulatory Visit (INDEPENDENT_AMBULATORY_CARE_PROVIDER_SITE_OTHER): Payer: Medicare Other | Admitting: Physician Assistant

## 2019-09-28 ENCOUNTER — Other Ambulatory Visit: Payer: Self-pay

## 2019-09-28 ENCOUNTER — Encounter: Payer: Self-pay | Admitting: Physician Assistant

## 2019-09-28 VITALS — BP 149/76 | HR 77 | Temp 98.5°F | Wt 152.0 lb

## 2019-09-28 DIAGNOSIS — L309 Dermatitis, unspecified: Secondary | ICD-10-CM | POA: Diagnosis not present

## 2019-09-28 NOTE — Patient Instructions (Signed)
Contact Dermatitis °Dermatitis is redness, soreness, and swelling (inflammation) of the skin. Contact dermatitis is a reaction to something that touches the skin. °There are two types of contact dermatitis: °· Irritant contact dermatitis. This happens when something bothers (irritates) your skin, like soap. °· Allergic contact dermatitis. This is caused when you are exposed to something that you are allergic to, such as poison ivy. °What are the causes? °· Common causes of irritant contact dermatitis include: °? Makeup. °? Soaps. °? Detergents. °? Bleaches. °? Acids. °? Metals, such as nickel. °· Common causes of allergic contact dermatitis include: °? Plants. °? Chemicals. °? Jewelry. °? Latex. °? Medicines. °? Preservatives in products, such as clothing. °What increases the risk? °· Having a job that exposes you to things that bother your skin. °· Having asthma or eczema. °What are the signs or symptoms? °Symptoms may happen anywhere the irritant has touched your skin. Symptoms include: °· Dry or flaky skin. °· Redness. °· Cracks. °· Itching. °· Pain or a burning feeling. °· Blisters. °· Blood or clear fluid draining from skin cracks. °With allergic contact dermatitis, swelling may occur. This may happen in places such as the eyelids, mouth, or genitals. °How is this treated? °· This condition is treated by checking for the cause of the reaction and protecting your skin. Treatment may also include: °? Steroid creams, ointments, or medicines. °? Antibiotic medicines or other ointments, if you have a skin infection. °? Lotion or medicines to help with itching. °? A bandage (dressing). °Follow these instructions at home: °Skin care °· Moisturize your skin as needed. °· Put cool cloths on your skin. °· Put a baking soda paste on your skin. Stir water into baking soda until it looks like a paste. °· Do not scratch your skin. °· Avoid having things rub up against your skin. °· Avoid the use of soaps, perfumes, and  dyes. °Medicines °· Take or apply over-the-counter and prescription medicines only as told by your doctor. °· If you were prescribed an antibiotic medicine, take or apply it as told by your doctor. Do not stop using it even if your condition starts to get better. °Bathing °· Take a bath with: °? Epsom salts. °? Baking soda. °? Colloidal oatmeal. °· Bathe less often. °· Bathe in warm water. Avoid using hot water. °Bandage care °· If you were given a bandage, change it as told by your health care provider. °· Wash your hands with soap and water before and after you change your bandage. If soap and water are not available, use hand sanitizer. °General instructions °· Avoid the things that caused your reaction. If you do not know what caused it, keep a journal. Write down: °? What you eat. °? What skin products you use. °? What you drink. °? What you wear in the area that has symptoms. This includes jewelry. °· Check the affected areas every day for signs of infection. Check for: °? More redness, swelling, or pain. °? More fluid or blood. °? Warmth. °? Pus or a bad smell. °· Keep all follow-up visits as told by your doctor. This is important. °Contact a doctor if: °· You do not get better with treatment. °· Your condition gets worse. °· You have signs of infection, such as: °? More swelling. °? Tenderness. °? More redness. °? Soreness. °? Warmth. °· You have a fever. °· You have new symptoms. °Get help right away if: °· You have a very bad headache. °· You have neck pain. °·   Your neck is stiff. °· You throw up (vomit). °· You feel very sleepy. °· You see red streaks coming from the area. °· Your bone or joint near the area hurts after the skin has healed. °· The area turns darker. °· You have trouble breathing. °Summary °· Dermatitis is redness, soreness, and swelling of the skin. °· Symptoms may occur where the irritant has touched you. °· Treatment may include medicines and skin care. °· If you do not know what caused  your reaction, keep a journal. °· Contact a doctor if your condition gets worse or you have signs of infection. °This information is not intended to replace advice given to you by your health care provider. Make sure you discuss any questions you have with your health care provider. °Document Revised: 05/25/2018 Document Reviewed: 08/18/2017 °Elsevier Patient Education © 2020 [employer] Inc. ° °

## 2019-09-28 NOTE — Progress Notes (Signed)
Established patient visit   Patient: Thomas Matthews   DOB: 27-May-1923   86 y.o. Male  MRN: [id number] Visit Date: 09/28/2019  Today's healthcare provider: Trinna Post, PA-C   Chief Complaint  Patient presents with  . Cyst   Subjective    HPI  Patient presents today with an injury on his lower right leg. About two months ago he had a fall and wound on his right lower leg. He was seen in this clinic and had negative right tib/fib and right ankle xrays for fracture, did show some swelling. He received some doxycycline at the time. He has swelling and redness for about 1 week. His daughter who presents with him today notes that he had a bandaid on the area for two weeks that he forgot to remove. She had a family member remove it today. He denies any drainage. He does have mild tenderness. He denies fevers, chills, difficulty walking.      Medications: Outpatient Medications Prior to Visit  Medication Sig  . apixaban (ELIQUIS) 2.5 MG TABS tablet Take by mouth.  . calcium citrate-vitamin D (CITRACAL+D) 315-200 MG-UNIT tablet Take by mouth.  . captopril (CAPOTEN) 12.5 MG tablet Take 12.5 mg by mouth daily at 2 PM.  . diltiazem (CARDIZEM) 30 MG tablet Take by mouth.  . furosemide (LASIX) 20 MG tablet Take 20 mg by mouth.  Marland Kitchen omeprazole (PRILOSEC) 20 MG capsule Take by mouth.  . oxybutynin (DITROPAN XL) 10 MG 24 hr tablet Take 10 mg by mouth at bedtime.  . pravastatin (PRAVACHOL) 20 MG tablet Take by mouth.  . tamsulosin (FLOMAX) 0.4 MG CAPS capsule Take by mouth.   No facility-administered medications prior to visit.    Review of Systems  Constitutional: Negative for activity change and fatigue.  Cardiovascular: Positive for leg swelling.  Musculoskeletal: Positive for myalgias.  Skin: Positive for color change and wound.      Objective    BP (!) 149/76   Pulse 77   Temp 98.5 F (36.9 C)   Wt 152 lb (68.9 kg)   BMI 23.81 kg/m    Physical Exam Constitutional:       Appearance: Normal appearance.  Cardiovascular:     Rate and Rhythm: Normal rate and regular rhythm.  Pulmonary:     Effort: Pulmonary effort is normal.  Skin:    General: Skin is warm and dry.     Findings: Rash present.  Neurological:     Mental Status: He is alert. Mental status is at baseline.  Psychiatric:        Mood and Affect: Mood normal.        Behavior: Behavior normal.      Media Information   Document Information  Photos  Right leg   09/28/2019 14:12  Attached To:  Office Visit on 09/28/19 with Trinna Post, PA-C  Source Information  Trinna Post, PA-C  [facility]    No results found for any visits on 09/28/19.  Assessment & Plan    1. Dermatitis  Suspect contact dermatitis due to bandaid as rash is in particular shape of bandaid which was adhered for 2+ weeks. Can use some OTC hydrocortisone cream, would avoid bandaid for now.    Return if symptoms worsen or fail to improve.      ITrinna Post, PA-C, have reviewed all documentation for this visit. The documentation on 10/04/19 for the exam, diagnosis, procedures, and orders are all accurate  and complete.  The entirety of the information documented in the History of Present Illness, Review of Systems and Physical Exam were personally obtained by me. Portions of this information were initially documented by Wilburt Finlay, CMA and reviewed by me for thoroughness and accuracy.        Paulene Floor  [facility] [phone] (phone) [phone] (fax)  [place]

## 2020-02-29 DIAGNOSIS — Z23 Encounter for immunization: Secondary | ICD-10-CM | POA: Diagnosis not present

## 2020-03-18 DIAGNOSIS — H353211 Exudative age-related macular degeneration, right eye, with active choroidal neovascularization: Secondary | ICD-10-CM | POA: Diagnosis not present

## 2020-03-28 DIAGNOSIS — H353211 Exudative age-related macular degeneration, right eye, with active choroidal neovascularization: Secondary | ICD-10-CM | POA: Diagnosis not present

## 2020-04-04 ENCOUNTER — Telehealth: Payer: Self-pay | Admitting: Family Medicine

## 2020-04-04 NOTE — Telephone Encounter (Signed)
Copied from [place] [id number]. Topic: Medicare AWV >> Apr 04, 2020 11:59 AM Cher Nakai R wrote: Reason for CRM:  No answer unable to leave a message for patient to call back and schedule Medicare Annual Wellness Visit (AWV) in office.   If not able to come in the office, please offer to do virtually or by telephone.   No hx of AWV - AWV-I eligible as of 02/16/2009  Please schedule at anytime with [facility] Health Advisor.   40 minute appointment  Any questions, please contact me at [phone]

## 2020-05-09 DIAGNOSIS — H26491 Other secondary cataract, right eye: Secondary | ICD-10-CM | POA: Diagnosis not present

## 2020-07-01 DIAGNOSIS — H353211 Exudative age-related macular degeneration, right eye, with active choroidal neovascularization: Secondary | ICD-10-CM | POA: Diagnosis not present

## 2020-07-10 ENCOUNTER — Ambulatory Visit: Payer: Self-pay | Admitting: *Deleted

## 2020-07-10 NOTE — Telephone Encounter (Signed)
  Reason for Disposition . [4] Systolic BP < 90 AND [8] dizzy, lightheaded, or weak  Protocols used: BLOOD PRESSURE - LOW-A-AH

## 2020-07-10 NOTE — Telephone Encounter (Addendum)
Daughter Thomas Matthews calling in from [place] area regarding her father.  A sitter is with him now in [place] at his house.   The sitter texted that his BP is low 80/49.  It's normally 140s/80s.   He has a pacemaker and chronic A. Fib.  The sitter checked his BP a couple more times spaced out over several minutes and it remains low in this range.   Denies having diarrhea or vomiting or being sick recently. Per [name] the daughter sometimes he doesn't drink enough water but there's no way to know how much he is drinking.   He'll tell you he is drinking a lot of water but he is actually just drinking a glass full.      "I just don't know what to do".   "I'm in [place] and he is in [place]".    The sitter wasn't sure what to do either.    "I can't go in".   She went on over 15 minutes telling me about all she had going on personally and how stressed out she was.    I allowed her to talk and I just listened.  Per the protocol due to his BP being so low and the sitter said he was a "little wobbly" getting out of the shower this morning and skinned his hand and his cardiac history I instructed [name] to call 911 and have him taken to the ED.  Since his balance is a little off and his BP is so low it would be dangerous to have him ambulate on his own.  She agreed calling 911 was best.    I let her know there was no way to know what was wrong with him over the phone he needed to be seen and evaluated.   There are so many things that can contribute to low BP.    [name] thanked me for allowing her to talk and me listening.   "I'm just so stressed out right now".    "I don't know what to do with my father right now".     [name] agreed to have the sitter call 911 and take him to the ED.    [name] earlier today tried to contact his [place] doctor but has not heard back from him.  "It's so hard to get through to that [place] hospital/doctors".    I sent my notes to [facility] for Dr Maralyn Sago  information.     Answer Assessment - Initial Assessment Questions 1. BLOOD PRESSURE: "What is the blood pressure?" "Did you take at least two measurements 5 minutes apart?"     Daughter calling in but not with him.    A sitter is with him now.   Daughter near [place], [place].     80/49 BP   [facility] not in town.    He was contacted last Sat. Because his BP was low.  His lisinopril was changed.   He has A. FIb.     His BP will always fluctuate. The sitter just texted me and said his medicine just came in.    11:30 this morning BP 84/49.    12:00 88/44.    He goes to the bathroom a lot at night.   We talked with the dr about this going to the bathroom all the time.    He has reduced his water intake.     He sleeps most of the time during the day.  I don't know if he is drinking or not since I'm not with him.     I tried [facility].    Is there anything we can do for his low BP?    Now his BP is low again.   He felt like he was going to pass out he told the sitter this morning.    This morning he lost his balance coming out of the shower and skinned his hand.   It's not bad.   Sitter is with him now.    2. ONSET: "When did you take your blood pressure?"     Daughter is out of town and not with him.  I can't go to his house.     just now the sitter texted BP is 85/45.    Pulse 62.     140/80 is his normal.     3. HOW: "How did you obtain the blood pressure?" (e.g., visiting nurse, automatic home BP monitor)     Has a BP machine at home sitter checking BP. 4. HISTORY: "Do you have a history of low blood pressure?" "What is your blood pressure normally?"     140s/80s is his normal.   5. MEDICATIONS: "Are you taking any medications for blood pressure?" If Yes, ask: "Have they been changed recently?"     Lisinopril   6. PULSE RATE: "Do you know what your pulse rate is?"      62  Has A. Fib 7. OTHER SYMPTOMS: "Have you been sick recently?" "Have you had a recent injury?"     No diarrhea or  vomiting or illnesses.  8. PREGNANCY: "Is there any chance you are pregnant?" "When was your last menstrual period?"     N/A  Protocols used: BLOOD PRESSURE - LOW-A-AH

## 2020-10-03 DIAGNOSIS — D485 Neoplasm of uncertain behavior of skin: Secondary | ICD-10-CM | POA: Diagnosis not present

## 2020-10-03 DIAGNOSIS — L820 Inflamed seborrheic keratosis: Secondary | ICD-10-CM | POA: Diagnosis not present

## 2020-10-03 DIAGNOSIS — X32XXXA Exposure to sunlight, initial encounter: Secondary | ICD-10-CM | POA: Diagnosis not present

## 2020-10-03 DIAGNOSIS — L570 Actinic keratosis: Secondary | ICD-10-CM | POA: Diagnosis not present

## 2020-10-28 DIAGNOSIS — H353211 Exudative age-related macular degeneration, right eye, with active choroidal neovascularization: Secondary | ICD-10-CM | POA: Diagnosis not present

## 2020-11-04 DIAGNOSIS — L600 Ingrowing nail: Secondary | ICD-10-CM | POA: Diagnosis not present

## 2020-11-04 DIAGNOSIS — M79675 Pain in left toe(s): Secondary | ICD-10-CM | POA: Diagnosis not present

## 2020-11-04 DIAGNOSIS — M79674 Pain in right toe(s): Secondary | ICD-10-CM | POA: Diagnosis not present

## 2020-11-04 DIAGNOSIS — B351 Tinea unguium: Secondary | ICD-10-CM | POA: Diagnosis not present

## 2020-12-05 DIAGNOSIS — I110 Hypertensive heart disease with heart failure: Secondary | ICD-10-CM | POA: Diagnosis not present

## 2020-12-05 DIAGNOSIS — I509 Heart failure, unspecified: Secondary | ICD-10-CM | POA: Diagnosis not present

## 2020-12-05 DIAGNOSIS — D649 Anemia, unspecified: Secondary | ICD-10-CM | POA: Diagnosis not present

## 2020-12-05 DIAGNOSIS — J189 Pneumonia, unspecified organism: Secondary | ICD-10-CM | POA: Diagnosis not present

## 2020-12-11 ENCOUNTER — Inpatient Hospital Stay
Admission: EM | Admit: 2020-12-11 | Discharge: 2020-12-17 | DRG: 291 | Disposition: A | Discharge status: Skilled Nursing Facility | Payer: Medicare Other | Attending: Internal Medicine | Admitting: Internal Medicine

## 2020-12-11 ENCOUNTER — Other Ambulatory Visit: Payer: Self-pay

## 2020-12-11 ENCOUNTER — Emergency Department: Payer: Medicare Other

## 2020-12-11 DIAGNOSIS — E43 Unspecified severe protein-calorie malnutrition: Secondary | ICD-10-CM | POA: Diagnosis not present

## 2020-12-11 DIAGNOSIS — K921 Melena: Principal | ICD-10-CM

## 2020-12-11 DIAGNOSIS — R42 Dizziness and giddiness: Secondary | ICD-10-CM | POA: Diagnosis not present

## 2020-12-11 DIAGNOSIS — S0990XD Unspecified injury of head, subsequent encounter: Secondary | ICD-10-CM | POA: Diagnosis not present

## 2020-12-11 DIAGNOSIS — E785 Hyperlipidemia, unspecified: Secondary | ICD-10-CM | POA: Diagnosis present

## 2020-12-11 DIAGNOSIS — I700 Atherosclerosis of aorta: Secondary | ICD-10-CM | POA: Diagnosis not present

## 2020-12-11 DIAGNOSIS — R000 Tachycardia, unspecified: Secondary | ICD-10-CM | POA: Diagnosis not present

## 2020-12-11 DIAGNOSIS — D509 Iron deficiency anemia, unspecified: Secondary | ICD-10-CM | POA: Diagnosis present

## 2020-12-11 DIAGNOSIS — N400 Enlarged prostate without lower urinary tract symptoms: Secondary | ICD-10-CM | POA: Diagnosis present

## 2020-12-11 DIAGNOSIS — I5043 Acute on chronic combined systolic (congestive) and diastolic (congestive) heart failure: Secondary | ICD-10-CM | POA: Diagnosis present

## 2020-12-11 DIAGNOSIS — I10 Essential (primary) hypertension: Secondary | ICD-10-CM | POA: Diagnosis not present

## 2020-12-11 DIAGNOSIS — I499 Cardiac arrhythmia, unspecified: Secondary | ICD-10-CM | POA: Diagnosis not present

## 2020-12-11 DIAGNOSIS — I6782 Cerebral ischemia: Secondary | ICD-10-CM | POA: Diagnosis not present

## 2020-12-11 DIAGNOSIS — Z950 Presence of cardiac pacemaker: Secondary | ICD-10-CM

## 2020-12-11 DIAGNOSIS — I5020 Unspecified systolic (congestive) heart failure: Secondary | ICD-10-CM

## 2020-12-11 DIAGNOSIS — I350 Nonrheumatic aortic (valve) stenosis: Secondary | ICD-10-CM | POA: Diagnosis not present

## 2020-12-11 DIAGNOSIS — Z87891 Personal history of nicotine dependence: Secondary | ICD-10-CM | POA: Diagnosis not present

## 2020-12-11 DIAGNOSIS — J439 Emphysema, unspecified: Secondary | ICD-10-CM | POA: Diagnosis not present

## 2020-12-11 DIAGNOSIS — Z66 Do not resuscitate: Secondary | ICD-10-CM | POA: Diagnosis not present

## 2020-12-11 DIAGNOSIS — Z952 Presence of prosthetic heart valve: Secondary | ICD-10-CM | POA: Diagnosis not present

## 2020-12-11 DIAGNOSIS — N3281 Overactive bladder: Secondary | ICD-10-CM | POA: Diagnosis not present

## 2020-12-11 DIAGNOSIS — I491 Atrial premature depolarization: Secondary | ICD-10-CM | POA: Diagnosis not present

## 2020-12-11 DIAGNOSIS — R778 Other specified abnormalities of plasma proteins: Secondary | ICD-10-CM

## 2020-12-11 DIAGNOSIS — S0990XA Unspecified injury of head, initial encounter: Secondary | ICD-10-CM | POA: Diagnosis present

## 2020-12-11 DIAGNOSIS — M5020 Other cervical disc displacement, unspecified cervical region: Secondary | ICD-10-CM | POA: Diagnosis not present

## 2020-12-11 DIAGNOSIS — S0003XA Contusion of scalp, initial encounter: Secondary | ICD-10-CM | POA: Diagnosis not present

## 2020-12-11 DIAGNOSIS — J189 Pneumonia, unspecified organism: Secondary | ICD-10-CM | POA: Diagnosis present

## 2020-12-11 DIAGNOSIS — G9341 Metabolic encephalopathy: Secondary | ICD-10-CM | POA: Diagnosis present

## 2020-12-11 DIAGNOSIS — I482 Chronic atrial fibrillation, unspecified: Secondary | ICD-10-CM | POA: Diagnosis present

## 2020-12-11 DIAGNOSIS — I472 Ventricular tachycardia, unspecified: Secondary | ICD-10-CM | POA: Diagnosis present

## 2020-12-11 DIAGNOSIS — J9601 Acute respiratory failure with hypoxia: Secondary | ICD-10-CM | POA: Diagnosis present

## 2020-12-11 DIAGNOSIS — I272 Pulmonary hypertension, unspecified: Secondary | ICD-10-CM | POA: Diagnosis present

## 2020-12-11 DIAGNOSIS — I517 Cardiomegaly: Secondary | ICD-10-CM | POA: Diagnosis not present

## 2020-12-11 DIAGNOSIS — I480 Paroxysmal atrial fibrillation: Secondary | ICD-10-CM | POA: Diagnosis present

## 2020-12-11 DIAGNOSIS — M47812 Spondylosis without myelopathy or radiculopathy, cervical region: Secondary | ICD-10-CM | POA: Diagnosis not present

## 2020-12-11 DIAGNOSIS — S199XXA Unspecified injury of neck, initial encounter: Secondary | ICD-10-CM | POA: Diagnosis not present

## 2020-12-11 DIAGNOSIS — I495 Sick sinus syndrome: Secondary | ICD-10-CM | POA: Diagnosis present

## 2020-12-11 DIAGNOSIS — E876 Hypokalemia: Secondary | ICD-10-CM | POA: Diagnosis not present

## 2020-12-11 DIAGNOSIS — K219 Gastro-esophageal reflux disease without esophagitis: Secondary | ICD-10-CM | POA: Diagnosis present

## 2020-12-11 DIAGNOSIS — R1313 Dysphagia, pharyngeal phase: Secondary | ICD-10-CM | POA: Diagnosis not present

## 2020-12-11 DIAGNOSIS — G629 Polyneuropathy, unspecified: Secondary | ICD-10-CM | POA: Diagnosis not present

## 2020-12-11 DIAGNOSIS — Z79899 Other long term (current) drug therapy: Secondary | ICD-10-CM

## 2020-12-11 DIAGNOSIS — E7800 Pure hypercholesterolemia, unspecified: Secondary | ICD-10-CM | POA: Diagnosis not present

## 2020-12-11 DIAGNOSIS — Z9181 History of falling: Secondary | ICD-10-CM | POA: Diagnosis not present

## 2020-12-11 DIAGNOSIS — D649 Anemia, unspecified: Secondary | ICD-10-CM | POA: Diagnosis present

## 2020-12-11 DIAGNOSIS — Z7901 Long term (current) use of anticoagulants: Secondary | ICD-10-CM | POA: Diagnosis not present

## 2020-12-11 DIAGNOSIS — L8990 Pressure ulcer of unspecified site, unspecified stage: Secondary | ICD-10-CM | POA: Diagnosis present

## 2020-12-11 DIAGNOSIS — I110 Hypertensive heart disease with heart failure: Principal | ICD-10-CM | POA: Diagnosis present

## 2020-12-11 DIAGNOSIS — I4891 Unspecified atrial fibrillation: Secondary | ICD-10-CM | POA: Diagnosis not present

## 2020-12-11 DIAGNOSIS — R4182 Altered mental status, unspecified: Secondary | ICD-10-CM | POA: Diagnosis not present

## 2020-12-11 DIAGNOSIS — J3489 Other specified disorders of nose and nasal sinuses: Secondary | ICD-10-CM | POA: Diagnosis not present

## 2020-12-11 DIAGNOSIS — G9340 Encephalopathy, unspecified: Secondary | ICD-10-CM | POA: Diagnosis not present

## 2020-12-11 DIAGNOSIS — L89151 Pressure ulcer of sacral region, stage 1: Secondary | ICD-10-CM | POA: Diagnosis present

## 2020-12-11 DIAGNOSIS — G319 Degenerative disease of nervous system, unspecified: Secondary | ICD-10-CM | POA: Diagnosis not present

## 2020-12-11 DIAGNOSIS — D500 Iron deficiency anemia secondary to blood loss (chronic): Secondary | ICD-10-CM | POA: Diagnosis present

## 2020-12-11 DIAGNOSIS — I5042 Chronic combined systolic (congestive) and diastolic (congestive) heart failure: Secondary | ICD-10-CM | POA: Diagnosis not present

## 2020-12-11 DIAGNOSIS — Z20822 Contact with and (suspected) exposure to covid-19: Secondary | ICD-10-CM | POA: Diagnosis present

## 2020-12-11 DIAGNOSIS — R0902 Hypoxemia: Secondary | ICD-10-CM | POA: Diagnosis not present

## 2020-12-11 DIAGNOSIS — K922 Gastrointestinal hemorrhage, unspecified: Secondary | ICD-10-CM | POA: Diagnosis present

## 2020-12-11 DIAGNOSIS — Z888 Allergy status to other drugs, medicaments and biological substances status: Secondary | ICD-10-CM

## 2020-12-11 DIAGNOSIS — I4729 Other ventricular tachycardia: Secondary | ICD-10-CM | POA: Diagnosis not present

## 2020-12-11 DIAGNOSIS — H3530 Unspecified macular degeneration: Secondary | ICD-10-CM | POA: Diagnosis not present

## 2020-12-11 DIAGNOSIS — I2510 Atherosclerotic heart disease of native coronary artery without angina pectoris: Secondary | ICD-10-CM | POA: Diagnosis present

## 2020-12-11 DIAGNOSIS — J90 Pleural effusion, not elsewhere classified: Secondary | ICD-10-CM | POA: Diagnosis not present

## 2020-12-11 LAB — CBC WITH DIFFERENTIAL/PLATELET
Abs Immature Granulocytes: 0.14 10*3/uL — ABNORMAL HIGH (ref 0.00–0.07)
Basophils Absolute: 0 10*3/uL (ref 0.0–0.1)
Basophils Relative: 0 %
Eosinophils Absolute: 0 10*3/uL (ref 0.0–0.5)
Eosinophils Relative: 0 %
HCT: 22.6 % — ABNORMAL LOW (ref 39.0–52.0)
Hemoglobin: 7.6 g/dL — ABNORMAL LOW (ref 13.0–17.0)
Immature Granulocytes: 1 %
Lymphocytes Relative: 4 %
Lymphs Abs: 0.5 10*3/uL — ABNORMAL LOW (ref 0.7–4.0)
MCH: 35.5 pg — ABNORMAL HIGH (ref 26.0–34.0)
MCHC: 33.6 g/dL (ref 30.0–36.0)
MCV: 105.6 fL — ABNORMAL HIGH (ref 80.0–100.0)
Monocytes Absolute: 0.9 10*3/uL (ref 0.1–1.0)
Monocytes Relative: 7 %
Neutro Abs: 10.8 10*3/uL — ABNORMAL HIGH (ref 1.7–7.7)
Neutrophils Relative %: 88 %
Platelets: 184 10*3/uL (ref 150–400)
RBC: 2.14 MIL/uL — ABNORMAL LOW (ref 4.22–5.81)
RDW: 15.1 % (ref 11.5–15.5)
WBC: 12.4 10*3/uL — ABNORMAL HIGH (ref 4.0–10.5)
nRBC: 0.3 % — ABNORMAL HIGH (ref 0.0–0.2)

## 2020-12-11 LAB — COMPREHENSIVE METABOLIC PANEL
ALT: 21 U/L (ref 0–44)
AST: 55 U/L — ABNORMAL HIGH (ref 15–41)
Albumin: 3.4 g/dL — ABNORMAL LOW (ref 3.5–5.0)
Alkaline Phosphatase: 59 U/L (ref 38–126)
Anion gap: 6 (ref 5–15)
BUN: 36 mg/dL — ABNORMAL HIGH (ref 8–23)
CO2: 24 mmol/L (ref 22–32)
Calcium: 8.3 mg/dL — ABNORMAL LOW (ref 8.9–10.3)
Chloride: 110 mmol/L (ref 98–111)
Creatinine, Ser: 0.78 mg/dL (ref 0.61–1.24)
GFR, Estimated: >60 mL/min (ref >60)
Glucose, Bld: 161 mg/dL — ABNORMAL HIGH (ref 70–99)
Potassium: 3.4 mmol/L — ABNORMAL LOW (ref 3.5–5.1)
Sodium: 140 mmol/L (ref 135–145)
Total Bilirubin: 1.2 mg/dL (ref 0.3–1.2)
Total Protein: 6.1 g/dL — ABNORMAL LOW (ref 6.5–8.1)

## 2020-12-11 LAB — ABO/RH: ABO/RH(D): O POS

## 2020-12-11 LAB — TROPONIN I (HIGH SENSITIVITY)
Troponin I (High Sensitivity): 119 ng/L (ref <18)
Troponin I (High Sensitivity): 147 ng/L (ref <18)

## 2020-12-11 LAB — FERRITIN: Ferritin: 64 ng/mL (ref 24–336)

## 2020-12-11 LAB — PREPARE RBC (CROSSMATCH)

## 2020-12-11 LAB — BRAIN NATRIURETIC PEPTIDE: B Natriuretic Peptide: 847.4 pg/mL — ABNORMAL HIGH (ref 0.0–100.0)

## 2020-12-11 LAB — PROCALCITONIN: Procalcitonin: <0.1 ng/mL

## 2020-12-11 LAB — IRON AND TIBC
Iron: 39 ug/dL — ABNORMAL LOW (ref 45–182)
Saturation Ratios: 12 % — ABNORMAL LOW (ref 17.9–39.5)
TIBC: 325 ug/dL (ref 250–450)
UIBC: 286 ug/dL

## 2020-12-11 MED ORDER — SALINE SPRAY 0.65 % NA SOLN
1.0000 | NASAL | Status: DC | PRN
  Filled 2020-12-11: qty 44

## 2020-12-11 MED ORDER — FUROSEMIDE 10 MG/ML IJ SOLN
20.0000 mg | Freq: Once | INTRAMUSCULAR | Status: AC
  Administered 2020-12-11: 20 mg via INTRAVENOUS
  Filled 2020-12-11: qty 4

## 2020-12-11 MED ORDER — ONDANSETRON HCL 4 MG PO TABS: 4.0000 mg | ORAL_TABLET | Freq: Four times a day (QID) | ORAL | Status: DC | PRN

## 2020-12-11 MED ORDER — ACETAMINOPHEN 325 MG PO TABS: 650.0000 mg | ORAL_TABLET | Freq: Four times a day (QID) | ORAL | Status: DC | PRN

## 2020-12-11 MED ORDER — ADULT MULTIVITAMIN W/MINERALS CH
1.0000 | ORAL_TABLET | Freq: Every day | ORAL | Status: DC
  Administered 2020-12-11 – 2020-12-17 (×7): 1 via ORAL
  Filled 2020-12-11 (×7): qty 1

## 2020-12-11 MED ORDER — CEFTRIAXONE SODIUM 2 G IJ SOLR
2.0000 g | INTRAMUSCULAR | Status: AC
  Administered 2020-12-12 – 2020-12-15 (×4): 2 g via INTRAVENOUS
  Filled 2020-12-11 (×4): qty 20

## 2020-12-11 MED ORDER — ACETAMINOPHEN 650 MG RE SUPP
650.0000 mg | Freq: Four times a day (QID) | RECTAL | Status: DC | PRN
  Filled 2020-12-11: qty 1

## 2020-12-11 MED ORDER — SODIUM CHLORIDE 0.9 % IV SOLN
10.0000 mL/h | Freq: Once | INTRAVENOUS | Status: AC
  Administered 2020-12-11: 10 mL/h via INTRAVENOUS

## 2020-12-11 MED ORDER — ONDANSETRON HCL 4 MG/2ML IJ SOLN: 4.0000 mg | Freq: Four times a day (QID) | INTRAMUSCULAR | Status: DC | PRN

## 2020-12-11 MED ORDER — ENOXAPARIN SODIUM 40 MG/0.4ML IJ SOSY
40.0000 mg | PREFILLED_SYRINGE | INTRAMUSCULAR | Status: DC
  Administered 2020-12-11: 40 mg via SUBCUTANEOUS
  Filled 2020-12-11: qty 0.4

## 2020-12-11 MED ORDER — AZITHROMYCIN 500 MG IV SOLR
500.0000 mg | INTRAVENOUS | Status: AC
  Administered 2020-12-12 – 2020-12-15 (×4): 500 mg via INTRAVENOUS
  Filled 2020-12-11 (×4): qty 500

## 2020-12-11 MED ORDER — FOLIC ACID 1 MG PO TABS
1.0000 mg | ORAL_TABLET | Freq: Every day | ORAL | Status: DC
  Administered 2020-12-11 – 2020-12-17 (×7): 1 mg via ORAL
  Filled 2020-12-11 (×6): qty 1

## 2020-12-11 MED ORDER — SODIUM CHLORIDE 0.9 % IV SOLN
1.0000 g | Freq: Once | INTRAVENOUS | Status: AC
  Administered 2020-12-11: 1 g via INTRAVENOUS
  Filled 2020-12-11: qty 10

## 2020-12-11 MED ORDER — SODIUM CHLORIDE 0.9 % IV SOLN
500.0000 mg | Freq: Once | INTRAVENOUS | Status: AC
  Administered 2020-12-11: 500 mg via INTRAVENOUS
  Filled 2020-12-11: qty 500

## 2020-12-11 MED ORDER — SODIUM CHLORIDE 0.9 % IV SOLN: 2.0000 g | INTRAVENOUS | Status: DC

## 2020-12-11 MED ORDER — THIAMINE HCL 100 MG PO TABS
100.0000 mg | ORAL_TABLET | Freq: Every day | ORAL | Status: DC
  Administered 2020-12-11 – 2020-12-17 (×7): 100 mg via ORAL
  Filled 2020-12-11 (×7): qty 1

## 2020-12-11 MED ORDER — BLISTEX MEDICATED EX OINT
1.0000 "application " | TOPICAL_OINTMENT | CUTANEOUS | Status: DC | PRN
  Filled 2020-12-11: qty 6.3

## 2020-12-11 MED ORDER — SODIUM CHLORIDE 0.9 % IV SOLN: INTRAVENOUS | Status: DC

## 2020-12-11 MED ORDER — SODIUM CHLORIDE 0.9 % IV SOLN: 500.0000 mg | INTRAVENOUS | Status: DC

## 2020-12-11 MED ORDER — POLYVINYL ALCOHOL 1.4 % OP SOLN
1.0000 [drp] | OPHTHALMIC | Status: DC | PRN
  Filled 2020-12-11: qty 15

## 2020-12-11 NOTE — Assessment & Plan Note (Addendum)
Due to falls. CT head demonstrated Biparietal scalp swelling and hematoma. Will hold Eliquis for now. Monitor.

## 2020-12-11 NOTE — Assessment & Plan Note (Signed)
Hemoglobin 7.6. Patient has been weak and has fallen twice in 36 hours. He is being transfused with 1 unit PRBC's in the ED. Monitor.

## 2020-12-11 NOTE — Assessment & Plan Note (Addendum)
Pt is on eliquis for this. Will hold it for now given GI bleed and head trauma with hematoma. Consider consult of cardiology for consequences of stopping eliquis if hemoglobin continues to drop. TAVR was performed at [facility] in 10/2017.

## 2020-12-11 NOTE — ED Provider Notes (Signed)
[facility] Emergency Department Provider Note   ____________________________________________   Event Date/Time   First MD Initiated Contact with Patient 12/11/20 1300     (approximate)  I have reviewed the triage vital signs and the nursing notes.   HISTORY  Chief Complaint Tachycardia (Pt from home, family reports not acting right for past couple days. 2 falls in last 36 hours. Pt has pacemaker w/o defibrillator. Pt in vtach for EMS, given 150 amio and 150 mL NS en route. Pt bigemeny with 6-8 beat runs of vtach per EMS after medications. )  HPI Thomas Matthews is a 87 y.o. male past medical history as noted above.  Patient with 2 recent falls not acting like himself.  He is also very pale.  He reports he has had a large stool recently but did not notice if it was bloody or not.  Patient is not coughing or running a fever.  Patient denies any chest pain or fever or cough.         History reviewed. No pertinent past medical history.  Patient Active Problem List   Diagnosis Date Noted   Acid reflux 07/27/2017   Acne erythematosa 07/27/2017   Flutter-fibrillation 07/27/2017   BP (high blood pressure) 07/27/2017   Cardiac pacemaker in situ 07/27/2017   Coronary atherosclerosis 07/27/2017   DD (diverticular disease) 07/27/2017   History of GI bleed 04/16/2008   Benign neoplasm of colon 02/27/2004    History reviewed. No pertinent surgical history.  Prior to Admission medications   Medication Sig Start Date End Date Taking? Authorizing Provider  apixaban (ELIQUIS) 2.5 MG TABS tablet Take 2.5 mg by mouth 2 (two) times daily. 11/13/17  Yes [provider]  calcium citrate-vitamin D (CITRACAL+D) 315-200 MG-UNIT tablet Take 2 tablets by mouth daily.   Yes [provider]  captopril (CAPOTEN) 12.5 MG tablet Take 6.25 mg by mouth 2 (two) times daily.   Yes [provider]  diltiazem (CARDIZEM) 30 MG tablet Take 30 mg by mouth 2  (two) times daily.   Yes [provider]  ferrous sulfate 325 (65 FE) MG tablet Take 324 mg by mouth every other day. 03/14/20  Yes [provider]  omeprazole (PRILOSEC) 20 MG capsule Take 20 mg by mouth daily.   Yes [provider]  oxybutynin (DITROPAN-XL) 10 MG 24 hr tablet Take 10 mg by mouth at bedtime.   Yes [provider]  pravastatin (PRAVACHOL) 20 MG tablet Take 20 mg by mouth daily.   Yes [provider]  tamsulosin (FLOMAX) 0.4 MG CAPS capsule Take 0.4 mg by mouth daily after breakfast. 11/11/11  Yes [provider]  tolterodine (DETROL LA) 4 MG 24 hr capsule Take 4 mg by mouth daily. 03/14/20  Yes [provider]  furosemide (LASIX) 20 MG tablet Take 20 mg by mouth. Patient not taking: No sig reported    [provider]    Allergies Amlodipine besylate, Felodipine, Oxybutynin chloride, Simvastatin, and Terazosin  History reviewed. No pertinent family history.  Social History Social History   Tobacco Use   Smoking status: Former   Smokeless tobacco: Never  Substance Use Topics   Alcohol use: No   Drug use: No    Review of Systems  Constitutional: No fever/chills Eyes: No visual changes. ENT: No sore throat. Cardiovascular: Denies chest pain. Respiratory: Denies shortness of breath. Gastrointestinal: No abdominal pain.  No nausea, no vomiting.  No diarrhea.  No constipation. Genitourinary: Negative for  dysuria. Musculoskeletal: Negative for back pain. Skin: Negative for rash. Neurological: Negative for headaches, focal weakness  ____________________________________________   PHYSICAL EXAM:  VITAL SIGNS: ED Triage Vitals  Enc Vitals Group     BP 12/11/20 1254 (!) 175/72     Pulse Rate 12/11/20 1254 81     Resp 12/11/20 1254 16     Temp 12/11/20 1254 (!) 97.4 F (36.3 C)     Temp Source 12/11/20 1254 Oral     SpO2 12/11/20 1254 95 %     Weight 12/11/20 1253 158 lb 4.6 oz (71.8 kg)      Height 12/11/20 1253 5\' 7"  (1.702 m)     Head Circumference --      Peak Flow --      Pain Score 12/11/20 1259 0     Pain Loc --      Pain Edu? --      Excl. in [place]? --     Constitutional: Alert and oriented. Well appearing and in no acute distress but very pale. Eyes: Conjunctivae are normal. PER Head: Atraumatic. Nose: No congestion/rhinnorhea. Mouth/Throat: Mucous membranes are moist.  Oropharynx non-erythematous. Neck: No stridor. Cardiovascular: Normal rate, regular rhythm. Grossly normal heart sounds.  Good peripheral circulation. Respiratory: Normal respiratory effort.  No retractions. Lungs CTAB. Gastrointestinal: Soft and nontender. No distention. No abdominal bruits. No CVA tenderness. Musculoskeletal: No lower extremity tenderness nor edema.  No joint effusions. Neurologic:  Normal speech and language. No gross focal neurologic deficits are appreciated. No gait instability. Skin:  Skin is warm, dry and intact. No rash noted. Psychiatric: Mood and affect are normal. Speech and behavior are normal.  ____________________________________________   LABS (all labs ordered are listed, but only abnormal results are displayed)  Labs Reviewed  COMPREHENSIVE METABOLIC PANEL - Abnormal; Notable for the following components:      Result Value   Potassium 3.4 (*)    Glucose, Bld 161 (*)    BUN 36 (*)    Calcium 8.3 (*)    Total Protein 6.1 (*)    Albumin 3.4 (*)    AST 55 (*)    All other components within normal limits  BRAIN NATRIURETIC PEPTIDE - Abnormal; Notable for the following components:   B Natriuretic Peptide 847.4 (*)    All other components within normal limits  CBC WITH DIFFERENTIAL/PLATELET - Abnormal; Notable for the following components:   WBC 12.4 (*)    RBC 2.14 (*)    Hemoglobin 7.6 (*)    HCT 22.6 (*)    MCV 105.6 (*)    MCH 35.5 (*)    nRBC 0.3 (*)    Neutro Abs 10.8 (*)    Lymphs Abs 0.5 (*)    Abs Immature Granulocytes 0.14 (*)    All other  components within normal limits  TROPONIN I (HIGH SENSITIVITY) - Abnormal; Notable for the following components:   Troponin I (High Sensitivity) 119 (*)    All other components within normal limits  RESP PANEL BY RT-PCR (FLU A&B, COVID) ARPGX2  PROCALCITONIN  PREPARE RBC (CROSSMATCH)  TYPE AND SCREEN  TROPONIN I (HIGH SENSITIVITY)   ____________________________________________  EKG  EKG read interpreted by me shows normal sinus rhythm with multiple PVCs and multifocal PVCs at a rate of 89.  The right axis appears to me to be rightward.  There are no acute ST-T wave changes. ____________________________________________  RADIOLOGY Gertha Calkin, personally viewed and evaluated these images (plain radiographs) as part of  my medical decision making, as well as reviewing the written report by the radiologist.  ED MD interpretation: Chest x-ray read by radiology reviewed by me could show either a pneumonia or CHF.  Lab work looks like it could support either diagnosis  Official radiology report(s): DG Chest Portable 1 View  Result Date: 12/11/2020 CLINICAL DATA:  Brief episodes of ventricular tachycardia, slightly altered Mental status, history hypertension, coronary artery disease EXAM: PORTABLE CHEST 1 VIEW COMPARISON:  Portable exam 1319 hours compared to 06/10/2011 FINDINGS: LEFT subclavian sequential transvenous pacemaker leads project at RIGHT atrium and RIGHT ventricle. Enlargement of cardiac silhouette post AVR. Mediastinal contours and pulmonary vascularity normal with atherosclerotic calcification of the aortic arch. Infiltrates identified throughout RIGHT lung, question asymmetric edema versus infection. Skin folds project over the lower RIGHT chest. Tiny bibasilar pleural effusions. No pneumothorax Bones demineralized. IMPRESSION: Enlargement of cardiac silhouette post TAVR pacemaker. Asymmetric interstitial infiltrates throughout RIGHT lung with tiny bibasilar effusions,  Western asymmetric pulmonary edema versus atypical infection. Electronically Signed   By: Lavonia Dana M.D.   On: 12/11/2020 14:04    ____________________________________________   PROCEDURES  Procedure(s) performed (including Critical Care) critical care time 20 minutes.  This includes speaking to the patient and EMS and in reviewing his old records and speaking to the daughter as well.  Additionally had begun treatment and will speak to the hospitalist.  Procedures   ____________________________________________   INITIAL IMPRESSION / ASSESSMENT AND PLAN / ED COURSE    ----------------------------------------- 3:57 PM on 12/11/2020 ----------------------------------------- Procalcitonin is still pending CT of the head and neck are still pending although I reviewed them and do not see any acute pathology.  Patient stable.  Patient is a [place] patient but the [place] has no beds.  We have checked today possibly an hour ago.  Patient will have to stay here.      Clinical Course as of 12/11/20 1555  Wed Dec 11, 2020  1550 Order Confirmation: NO CURRENT SAMPLE, MUST ORDER TYPE AND SCREEN Performed at [facility], [address] [place], [place] [id number]  [PM]  1550 Prepare RBC (crossmatch) [PM]    Clinical Course User Index [PM] Nena Polio, MD     ____________________________________________   FINAL CLINICAL IMPRESSION(S) / ED DIAGNOSES  Final diagnoses:  Melena  Anemia, unspecified type  Systolic congestive heart failure, unspecified HF chronicity ([place])  Elevated troponin  Community acquired pneumonia, unspecified laterality     ED Discharge Orders     None        Note:  This document was prepared using Dragon voice recognition software and may include unintentional dictation errors.    Nena Polio, MD 12/11/20 [phone]

## 2020-12-11 NOTE — H&P (Signed)
Thomas Matthews is an 87 y.o. male.   Chief Complaint: Altered mental status, increasing debility with 2 falls in 36 hours. HPI: The patient is a 87 yr old man who was brought to the ED by his son for the above chief complaints.   In the ED the patient was found to have a hemoglobin of 7.6. he also had 2 runs of NSVT for which he was started on amiodarone. He was also found to have maroon stools.  His potassium is low and has been supplemented. Magnesium is pending. The patient is being transfused with 1 unit PRBC's x 1.   [facility] hospitalists have been consulted to admit the patient for further evaluation and treatment.   He will be admitted to a telemetry bed. Contact and airborne precautions will be continued until his COVID test determines his COVID status. He will be continued on amiodarone. Eliquis will be held. His H&H will be followed carefully. PT/OT will evaluate the patient for appropriate disposition when appropriate.   He denies fevers, chills, cough, shortness of breath. No nausea, vomiting, constipation or diarrhea. The maroon stools were noted by nursing in the ED. The patient states that he hasn't noted any change in his stools.   The patient will be admitted to a telemetry bed. He is receiving one unit of PRBC's now. His hemoglobin will be followed. Cardiology should be consulted in the am regarding the need for eliquis at this point more than 3 years after his TAVR.   History reviewed. No pertinent past medical history.  History reviewed. No pertinent surgical history.  History reviewed. No pertinent family history. Social History:  reports that he has quit smoking. He has never used smokeless tobacco. He reports that he does not drink alcohol and does not use drugs. (Not in a hospital admission)   Allergies:  Allergies  Allergen Reactions   Amlodipine Besylate Swelling   Felodipine Swelling   Oxybutynin Chloride Other (See Comments)    Other reaction(s):  Dizziness Tolerates taking at night   Simvastatin     Other reaction(s): Dizziness   Terazosin     Other reaction(s): Low blood pressure    Review of Systems -12 systems have been reviewed with the patient. All Negative except for those items detailed in the HPI above.      General appearance:  The patient is elderly, weak, and frail. He is cachectic and very hard of hearing. No acute distress. Head: Normocephalic, without obvious abnormality, atraumatic, scalp contusion Eyes: conjunctivae/corneas clear. PERRL, EOM's intact. Fundi benign. Throat: lips, mucosa, and tongue normal; teeth and gums normal Neck: no adenopathy, no carotid bruit, no JVD, supple, symmetrical, trachea midline, and thyroid not enlarged, symmetric, no tenderness/mass/nodules Resp:  No increased work of breathing. No wheezes, rales, or rhonchi. No tactile fremitus.  Chest wall: no tenderness, no tenderness to palpation. Cardio: regular rate and rhythm, S1, S2 normal, no murmur, click, rub or gallop, normal apical impulse, and no thrill. GI: soft, non-tender; bowel sounds normal; no masses,  no organomegaly and scaffoid. Extremities: extremities normal, atraumatic, no cyanosis or edema and but there is cachexia. Pulses: 2+ and symmetric Skin: Skin color, texture, turgor normal. No rashes or lesions Lymph nodes: Cervical, supraclavicular, and axillary nodes normal. Incision/Wound: None noted.  Results for orders placed or performed during the hospital encounter of 12/11/20 (from the past 48 hour(s))  Comprehensive metabolic panel     Status: Abnormal   Collection Time: 12/11/20  1:02 PM  Result Value  Ref Range   Sodium 140 135 - 145 mmol/L   Potassium 3.4 (L) 3.5 - 5.1 mmol/L   Chloride 110 98 - 111 mmol/L   CO2 24 22 - 32 mmol/L   Glucose, Bld 161 (H) 70 - 99 mg/dL    Comment: Glucose reference range applies only to samples taken after fasting for at least 8 hours.   BUN 36 (H) 8 - 23 mg/dL   Creatinine, Ser  0.78 0.61 - 1.24 mg/dL   Calcium 8.3 (L) 8.9 - 10.3 mg/dL   Total Protein 6.1 (L) 6.5 - 8.1 g/dL   Albumin 3.4 (L) 3.5 - 5.0 g/dL   AST 55 (H) 15 - 41 U/L   ALT 21 0 - 44 U/L   Alkaline Phosphatase 59 38 - 126 U/L   Total Bilirubin 1.2 0.3 - 1.2 mg/dL   GFR, Estimated >60 >60 mL/min    Comment: (NOTE) Calculated using the CKD-EPI Creatinine Equation (2021)    Anion gap 6 5 - 15    Comment: Performed at [facility], [address] [place], [place] [address]  Brain natriuretic peptide     Status: Abnormal   Collection Time: 12/11/20  1:02 PM  Result Value Ref Range   B Natriuretic Peptide 847.4 (H) 0.0 - 100.0 pg/mL    Comment: Performed at [facility], [address] [place], [place] [address]  Troponin I (High Sensitivity)     Status: Abnormal   Collection Time: 12/11/20  1:02 PM  Result Value Ref Range   Troponin I (High Sensitivity) 119 (HH) <18 ng/L    Comment: CRITICAL RESULT CALLED TO, READ BACK BY AND VERIFIED WITH MORGAN CATES 12/11/20 1408 MW/KLW (NOTE) Elevated high sensitivity troponin I (hsTnI) values and significant  changes across serial measurements may suggest ACS but many other  chronic and acute conditions are known to elevate hsTnI results.  Refer to the "Links" section for chest pain algorithms and additional  guidance. Performed at [facility], [address] [place], [place] [address]   CBC with Differential     Status: Abnormal   Collection Time: 12/11/20  1:02 PM  Result Value Ref Range   WBC 12.4 (H) 4.0 - 10.5 K/uL   RBC 2.14 (L) 4.22 - 5.81 MIL/uL   Hemoglobin 7.6 (L) 13.0 - 17.0 g/dL   HCT 22.6 (L) 39.0 - 52.0 %   MCV 105.6 (H) 80.0 - 100.0 fL   MCH 35.5 (H) 26.0 - 34.0 pg   MCHC 33.6 30.0 - 36.0 g/dL   RDW 15.1 11.5 - 15.5 %   Platelets 184 150 - 400 K/uL   nRBC 0.3 (H) 0.0 - 0.2 %   Neutrophils Relative % 88 %   Neutro Abs 10.8 (H) 1.7 - 7.7 K/uL   Lymphocytes Relative 4 %   Lymphs Abs 0.5 (L) 0.7 - 4.0  K/uL   Monocytes Relative 7 %   Monocytes Absolute 0.9 0.1 - 1.0 K/uL   Eosinophils Relative 0 %   Eosinophils Absolute 0.0 0.0 - 0.5 K/uL   Basophils Relative 0 %   Basophils Absolute 0.0 0.0 - 0.1 K/uL   Immature Granulocytes 1 %   Abs Immature Granulocytes 0.14 (H) 0.00 - 0.07 K/uL    Comment: Performed at [facility], [address], [place], [place] [address]  ABO/Rh     Status: None   Collection Time: 12/11/20  1:02 PM  Result Value Ref Range   ABO/RH(D)  O POS Performed at [facility], [address] [place], [place] [address]   Prepare RBC (crossmatch)     Status: None   Collection Time: 12/11/20  3:07 PM  Result Value Ref Range   Order Confirmation      ORDER PROCESSED BY BLOOD BANK Performed at [facility], [address] [place], [place] [address]   Troponin I (High Sensitivity)     Status: Abnormal   Collection Time: 12/11/20  3:39 PM  Result Value Ref Range   Troponin I (High Sensitivity) 147 (HH) <18 ng/L    Comment: CRITICAL VALUE NOTED. VALUE IS CONSISTENT WITH PREVIOUSLY REPORTED/CALLED VALUE SS (NOTE) Elevated high sensitivity troponin I (hsTnI) values and significant  changes across serial measurements may suggest ACS but many other  chronic and acute conditions are known to elevate hsTnI results.  Refer to the "Links" section for chest pain algorithms and additional  guidance. Performed at [facility], [place], [place] [address]   Procalcitonin - Baseline     Status: None   Collection Time: 12/11/20  3:39 PM  Result Value Ref Range   Procalcitonin <0.10 ng/mL    Comment:        Interpretation: PCT (Procalcitonin) <= 0.5 ng/mL: Systemic infection (sepsis) is not likely. Local bacterial infection is possible. (NOTE)       Sepsis PCT Algorithm           Lower Respiratory Tract                                      Infection PCT Algorithm    ----------------------------      ----------------------------         PCT < 0.25 ng/mL                PCT < 0.10 ng/mL          Strongly encourage             Strongly discourage   discontinuation of antibiotics    initiation of antibiotics    ----------------------------     -----------------------------       PCT 0.25 - 0.50 ng/mL            PCT 0.10 - 0.25 ng/mL               OR       >80% decrease in PCT            Discourage initiation of                                            antibiotics      Encourage discontinuation           of antibiotics    ----------------------------     -----------------------------         PCT >= 0.50 ng/mL              PCT 0.26 - 0.50 ng/mL               AND        <80% decrease in PCT             Encourage initiation of  antibiotics       Encourage continuation           of antibiotics    ----------------------------     -----------------------------        PCT >= 0.50 ng/mL                  PCT > 0.50 ng/mL               AND         increase in PCT                  Strongly encourage                                      initiation of antibiotics    Strongly encourage escalation           of antibiotics                                     -----------------------------                                           PCT <= 0.25 ng/mL                                                 OR                                        > 80% decrease in PCT                                      Discontinue / Do not initiate                                             antibiotics  Performed at [facility], [address] [place], [place] [address]   Type and screen [place]     Status: None (Preliminary result)   Collection Time: 12/11/20  3:39 PM  Result Value Ref Range   ABO/RH(D) O POS    Antibody Screen NEG    Sample Expiration 12/14/2020,2359    Unit Number [id number]    Blood Component Type RED CELLS,LR     Unit division 00    Status of Unit ISSUED    Transfusion Status OK TO TRANSFUSE    Crossmatch Result      Compatible Performed at [facility], [address] [place], [place] [address]    Unit Number [id number]    Blood Component Type RED CELLS,LR    Unit division 00    Status of Unit ALLOCATED    Transfusion Status OK TO TRANSFUSE    Crossmatch Result Compatible    @[email or web address] @  Blood pressure 137/83, pulse 75, temperature 98.2  F (36.8 C), temperature source Oral, resp. rate 20, height 5\' 7"  (1.702 m), weight 71.8 kg, SpO2 96 %.    Assessment/Plan GI bleeding The patient presents with increasing weakness and maroon stools. His hemoglobin is 7.6. Unfortunately, the most recent hemoglobin before this was in 2013. The patient states that he has not noticed any abnormalities of his bowel movements. The ED is transfusing him with one unit PRBC's. Consider consult of GI in the am. Will monitor overnight to rule out further arrhythmias. Will also monitor hemoglobin carefully. Hold Eliquis for now.  Symptomatic anemia Hemoglobin 7.6. Patient has been weak and has fallen twice in 36 hours. He is being transfused with 1 unit PRBC's in the ED. Monitor.  NSVT (nonsustained ventricular tachycardia) Noted in ED. The patient has had 2 runs of NSVT. He has been started on amiodarone. Monitor on telemetry. Keep K at > 4 and Mg > 2. He is being transfused with one unit prbc's. Monitor on telemetry.  Head trauma Due to falls. CT head demonstrated Biparietal scalp swelling and hematoma. Will hold Eliquis for now. Monitor.  History of transcatheter aortic valve replacement (TAVR) Pt is on eliquis for this. Will hold it for now given GI bleed and head trauma with hematoma. Consider consult of cardiology for consequences of stopping eliquis if hemoglobin continues to drop. TAVR was performed at [facility] in 10/2017.  I have seen and examined this patient myself. I have spent 78 minutes in  his evaluation and care.  Scotlynn Noyes 12/11/2020, 7:00 PM

## 2020-12-11 NOTE — Assessment & Plan Note (Signed)
Noted in ED. The patient has had 2 runs of NSVT. He has been started on amiodarone. Monitor on telemetry. Keep K at > 4 and Mg > 2. He is being transfused with one unit prbc's. Monitor on telemetry.

## 2020-12-11 NOTE — Assessment & Plan Note (Addendum)
The patient presents with increasing weakness and maroon stools. His hemoglobin is 7.6. Unfortunately, the most recent hemoglobin before this was in 2013. The patient states that he has not noticed any abnormalities of his bowel movements. The ED is transfusing him with one unit PRBC's. Consider consult of GI in the am. Will monitor overnight to rule out further arrhythmias. Will also monitor hemoglobin carefully. Hold Eliquis for now.

## 2020-12-12 DIAGNOSIS — D649 Anemia, unspecified: Secondary | ICD-10-CM | POA: Diagnosis not present

## 2020-12-12 DIAGNOSIS — K922 Gastrointestinal hemorrhage, unspecified: Secondary | ICD-10-CM | POA: Diagnosis not present

## 2020-12-12 DIAGNOSIS — I4729 Other ventricular tachycardia: Secondary | ICD-10-CM | POA: Diagnosis not present

## 2020-12-12 DIAGNOSIS — Z952 Presence of prosthetic heart valve: Secondary | ICD-10-CM | POA: Diagnosis not present

## 2020-12-12 LAB — BPAM RBC
Blood Product Expiration Date: 202211102359
Blood Product Expiration Date: 202211102359
ISSUE DATE / TIME: 202210261705
ISSUE DATE / TIME: 202210262101
Unit Type and Rh: 5100
Unit Type and Rh: 5100

## 2020-12-12 LAB — TYPE AND SCREEN
ABO/RH(D): O POS
Antibody Screen: NEGATIVE
Unit division: 0
Unit division: 0

## 2020-12-12 LAB — VITAMIN B12: Vitamin B-12: 182 pg/mL (ref 180–914)

## 2020-12-12 LAB — RETICULOCYTES
Immature Retic Fract: 37 % — ABNORMAL HIGH (ref 2.3–15.9)
RBC.: 3.48 MIL/uL — ABNORMAL LOW (ref 4.22–5.81)
Retic Count, Absolute: 144.8 10*3/uL (ref 19.0–186.0)
Retic Ct Pct: 4.2 % — ABNORMAL HIGH (ref 0.4–3.1)

## 2020-12-12 LAB — CBC
HCT: 33.5 % — ABNORMAL LOW (ref 39.0–52.0)
Hemoglobin: 11.7 g/dL — ABNORMAL LOW (ref 13.0–17.0)
MCH: 33.3 pg (ref 26.0–34.0)
MCHC: 34.9 g/dL (ref 30.0–36.0)
MCV: 95.4 fL (ref 80.0–100.0)
Platelets: 189 10*3/uL (ref 150–400)
RBC: 3.51 MIL/uL — ABNORMAL LOW (ref 4.22–5.81)
RDW: 21.2 % — ABNORMAL HIGH (ref 11.5–15.5)
WBC: 16 10*3/uL — ABNORMAL HIGH (ref 4.0–10.5)
nRBC: 0.5 % — ABNORMAL HIGH (ref 0.0–0.2)

## 2020-12-12 LAB — FOLATE: Folate: 15.4 ng/mL (ref >5.9)

## 2020-12-12 LAB — HEMOGLOBIN AND HEMATOCRIT, BLOOD
HCT: 31.7 % — ABNORMAL LOW (ref 39.0–52.0)
HCT: 32.5 % — ABNORMAL LOW (ref 39.0–52.0)
HCT: 33.4 % — ABNORMAL LOW (ref 39.0–52.0)
Hemoglobin: 10.9 g/dL — ABNORMAL LOW (ref 13.0–17.0)
Hemoglobin: 11.4 g/dL — ABNORMAL LOW (ref 13.0–17.0)
Hemoglobin: 11.9 g/dL — ABNORMAL LOW (ref 13.0–17.0)

## 2020-12-12 LAB — PROTIME-INR
INR: 1.3 — ABNORMAL HIGH (ref 0.8–1.2)
Prothrombin Time: 15.8 seconds — ABNORMAL HIGH (ref 11.4–15.2)

## 2020-12-12 LAB — COMPREHENSIVE METABOLIC PANEL
ALT: 26 U/L (ref 0–44)
AST: 61 U/L — ABNORMAL HIGH (ref 15–41)
Albumin: 3.3 g/dL — ABNORMAL LOW (ref 3.5–5.0)
Alkaline Phosphatase: 64 U/L (ref 38–126)
Anion gap: 12 (ref 5–15)
BUN: 28 mg/dL — ABNORMAL HIGH (ref 8–23)
CO2: 25 mmol/L (ref 22–32)
Calcium: 8.5 mg/dL — ABNORMAL LOW (ref 8.9–10.3)
Chloride: 103 mmol/L (ref 98–111)
Creatinine, Ser: 0.86 mg/dL (ref 0.61–1.24)
GFR, Estimated: >60 mL/min (ref >60)
Glucose, Bld: 113 mg/dL — ABNORMAL HIGH (ref 70–99)
Potassium: 3 mmol/L — ABNORMAL LOW (ref 3.5–5.1)
Sodium: 140 mmol/L (ref 135–145)
Total Bilirubin: 2 mg/dL — ABNORMAL HIGH (ref 0.3–1.2)
Total Protein: 6.4 g/dL — ABNORMAL LOW (ref 6.5–8.1)

## 2020-12-12 LAB — POTASSIUM: Potassium: 3.3 mmol/L — ABNORMAL LOW (ref 3.5–5.1)

## 2020-12-12 LAB — MAGNESIUM
Magnesium: 1.9 mg/dL (ref 1.7–2.4)
Magnesium: 2 mg/dL (ref 1.7–2.4)

## 2020-12-12 LAB — IRON AND TIBC
Iron: 81 ug/dL (ref 45–182)
Saturation Ratios: 25 % (ref 17.9–39.5)
TIBC: 319 ug/dL (ref 250–450)
UIBC: 238 ug/dL

## 2020-12-12 LAB — RESP PANEL BY RT-PCR (FLU A&B, COVID) ARPGX2
Influenza A by PCR: NEGATIVE
Influenza B by PCR: NEGATIVE
SARS Coronavirus 2 by RT PCR: NEGATIVE

## 2020-12-12 LAB — FERRITIN: Ferritin: 89 ng/mL (ref 24–336)

## 2020-12-12 MED ORDER — FUROSEMIDE 10 MG/ML IJ SOLN
40.0000 mg | Freq: Two times a day (BID) | INTRAMUSCULAR | Status: DC
  Administered 2020-12-12 – 2020-12-13 (×3): 40 mg via INTRAVENOUS
  Filled 2020-12-12 (×3): qty 4

## 2020-12-12 MED ORDER — POTASSIUM CHLORIDE CRYS ER 20 MEQ PO TBCR
40.0000 meq | EXTENDED_RELEASE_TABLET | Freq: Once | ORAL | Status: AC
  Administered 2020-12-13: 40 meq via ORAL
  Filled 2020-12-12: qty 2

## 2020-12-12 MED ORDER — INFLUENZA VAC A&B SA ADJ QUAD 0.5 ML IM PRSY
0.5000 mL | PREFILLED_SYRINGE | INTRAMUSCULAR | Status: DC
  Filled 2020-12-12: qty 0.5

## 2020-12-12 MED ORDER — FUROSEMIDE 10 MG/ML IJ SOLN
20.0000 mg | Freq: Once | INTRAMUSCULAR | Status: AC
  Administered 2020-12-12: 20 mg via INTRAVENOUS
  Filled 2020-12-12: qty 4

## 2020-12-12 MED ORDER — HYDRALAZINE HCL 25 MG PO TABS: 25.0000 mg | ORAL_TABLET | Freq: Four times a day (QID) | ORAL | Status: DC | PRN

## 2020-12-12 MED ORDER — PANTOPRAZOLE SODIUM 40 MG IV SOLR
40.0000 mg | Freq: Two times a day (BID) | INTRAVENOUS | Status: DC
  Administered 2020-12-12 – 2020-12-16 (×10): 40 mg via INTRAVENOUS
  Filled 2020-12-12 (×11): qty 40

## 2020-12-12 MED ORDER — POTASSIUM CHLORIDE CRYS ER 20 MEQ PO TBCR
30.0000 meq | EXTENDED_RELEASE_TABLET | ORAL | Status: AC
  Administered 2020-12-12 (×3): 30 meq via ORAL
  Filled 2020-12-12 (×2): qty 2

## 2020-12-12 MED ORDER — SODIUM CHLORIDE 0.9 % IV SOLN
250.0000 mg | Freq: Every day | INTRAVENOUS | Status: AC
  Administered 2020-12-12 – 2020-12-13 (×2): 250 mg via INTRAVENOUS
  Filled 2020-12-12 (×2): qty 20

## 2020-12-12 MED ORDER — FUROSEMIDE 10 MG/ML IJ SOLN
40.0000 mg | Freq: Once | INTRAMUSCULAR | Status: AC
  Administered 2020-12-12: 40 mg via INTRAVENOUS
  Filled 2020-12-12: qty 4

## 2020-12-12 MED ORDER — CAPTOPRIL 25 MG PO TABS
12.5000 mg | ORAL_TABLET | Freq: Two times a day (BID) | ORAL | Status: DC
Start: 2020-12-12 — End: 2020-12-14
  Administered 2020-12-12 – 2020-12-13 (×4): 12.5 mg via ORAL
  Filled 2020-12-12 (×8): qty 0.5

## 2020-12-12 MED ORDER — DILTIAZEM HCL 30 MG PO TABS
30.0000 mg | ORAL_TABLET | Freq: Two times a day (BID) | ORAL | Status: DC
  Administered 2020-12-12 – 2020-12-13 (×3): 30 mg via ORAL
  Filled 2020-12-12 (×3): qty 1

## 2020-12-12 MED ORDER — POTASSIUM CHLORIDE 10 MEQ/100ML IV SOLN
10.0000 meq | INTRAVENOUS | Status: AC
Start: 2020-12-12 — End: 2020-12-12
  Administered 2020-12-12 (×2): 10 meq via INTRAVENOUS

## 2020-12-12 MED ORDER — TAMSULOSIN HCL 0.4 MG PO CAPS
0.4000 mg | ORAL_CAPSULE | Freq: Every day | ORAL | Status: DC
  Administered 2020-12-12 – 2020-12-17 (×6): 0.4 mg via ORAL
  Filled 2020-12-12 (×6): qty 1

## 2020-12-12 NOTE — Evaluation (Signed)
Physical Therapy Evaluation Patient Details Name: JANDEL PATRIARCA MRN: [id number] DOB: 01/21/1924 Today's Date: 12/12/2020  History of Present Illness  PT is a 87 year old male who was brought to ED by his son for AMS and 2 falls in the home 36 hours prior to admission. Pt found to have low hemoglobin and transfused. 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  Clinical Impression  Pt is a pleasant 87 year old male who presents to ED for AMS and falls in the home. Pt reports at baseline he ambulates household distances with RW and reports having falls in his bathroom due to the floor being "slippery". Pt requiring modA for bed mobility, minA for transfers, and minA for side stepping at EOB with RW. Pt requiring verbal and tactile cues for management of device for improved safety. Pt on 10L HFNC during evaluation and stats monitored throughout: Rest 95% SpO2 HR 95 bpm, Standing desaturation to 79% SpO2 HR 105 bpm. Pt demonstrating decreased safety awareness, decreased LE strength, impaired balance, and decreased caregiver support. Recommending SNF at discharge to improve overall independence with mobility and to reduce likelihood of falls.      Recommendations for follow up therapy are one component of a multi-disciplinary discharge planning process, led by the attending physician.  Recommendations may be updated based on patient status, additional functional criteria and insurance authorization.  Follow Up Recommendations Skilled nursing-short term rehab (<3 hours/day)    Assistance Recommended at Discharge Frequent or constant Supervision/Assistance  Functional Status Assessment Patient has had a recent decline in their functional status and demonstrates the ability to make significant improvements in function in a reasonable and predictable amount of time.  Equipment Recommendations  None recommended by PT (BD next venue of care)    Recommendations for Other Services       Precautions /  Restrictions Precautions Precautions: Fall Precaution Comments: monitor O2 sats Restrictions Weight Bearing Restrictions: No      Mobility  Bed Mobility Overal bed mobility: Needs Assistance Bed Mobility: Supine to Sit;Sit to Supine     Supine to sit: Mod assist Sit to supine: Mod assist   General bed mobility comments: RN requested OT hold mobility 2/2 desat at rest and up to 10L O2    Transfers Overall transfer level: Needs assistance Equipment used: Rolling walker (2 wheels) Transfers: Sit to/from Stand Sit to Stand: Min assist;From elevated surface           General transfer comment: RN requested OT hold mobility 2/2 desat at rest and up to 10L O2    Ambulation/Gait Ambulation/Gait assistance: Min assist Gait Distance (Feet): 5 Feet Assistive device: Rolling walker (2 wheels) Gait Pattern/deviations: Step-to pattern;Trunk flexed     General Gait Details: Side steping at the side of the bed with RW. Pt requiring maximal verbal cues for sequencing and RW management to complete task.  Stairs            Wheelchair Mobility    Modified Rankin (Stroke Patients Only)       Balance Overall balance assessment: Needs assistance;History of Falls Sitting-balance support: Bilateral upper extremity supported;Feet supported Sitting balance-Leahy Scale: Fair Sitting balance - Comments: Requiring cues and assistance to maintain seated balance sitting at EOB   Standing balance support: Reliant on assistive device for balance;During functional activity Standing balance-Leahy Scale: Fair Standing balance comment: Requires AD and CGA for maintaining safety with sidestepping at EOB  Pertinent Vitals/Pain Pain Assessment: No/denies pain    Home Living Family/patient expects to be discharged to:: Private residence Living Arrangements: Alone Available Help at Discharge: Family;Available PRN/intermittently Type of Home:  House Home Access: Stairs to enter Entrance Stairs-Rails: Left;Right;Can reach both Technical brewer of Steps: 4   Home Layout: One level Home Equipment: Standard Walker;Cane - single point Additional Comments: Dtr reports she visits pt 3x/wk and she has a Actuary come out 3x/wk    Prior Function Prior Level of Function : Needs assist  Cognitive Assist : ADLs (cognitive)   ADLs (Cognitive): Set up cues (family provides set up for meds, pt then able to follow routine well)       Mobility Comments: Pt reports ambulating with SPC until today when he used RW ADLs Comments: Family assists with meals (although pt able to boil eggs per dtr), set up meds. Pt still drives, is forgetful but does very well with set routines. Indep with bathing, dressing.     Hand Dominance        Extremity/Trunk Assessment   Upper Extremity Assessment Upper Extremity Assessment: Generalized weakness    Lower Extremity Assessment Lower Extremity Assessment: Generalized weakness RLE Deficits / Details: Decreased LE strength grossly 4/5. RLE Sensation: WNL LLE Deficits / Details: Decreased LE strength grossly 4/5. LLE Sensation: WNL       Communication   Communication: HOH  Cognition Arousal/Alertness: Awake/alert Behavior During Therapy: WFL for tasks assessed/performed Overall Cognitive Status: Within Functional Limits for tasks assessed                                 General Comments: HOH, but with time and repetition, pt able to respond appropriately        General Comments      Exercises Other Exercises Other Exercises: Pt instructed in pursed lip breathing to improve O2 sats.   Assessment/Plan    PT Assessment Patient needs continued PT services  PT Problem List Decreased strength;Decreased range of motion;Decreased activity tolerance;Decreased balance;Decreased mobility;Decreased knowledge of use of DME;Decreased safety awareness;Cardiopulmonary status limiting  activity       PT Treatment Interventions DME instruction;Gait training;Stair training;Functional mobility training;Therapeutic activities;Therapeutic exercise;Balance training;Neuromuscular re-education;Cognitive remediation;Patient/family education;Manual techniques;Modalities    PT Goals (Current goals can be found in the Care Plan section)  Acute Rehab PT Goals Patient Stated Goal: to go home and have less falls PT Goal Formulation: With patient Time For Goal Achievement: 12/26/20 Potential to Achieve Goals: Good    Frequency Min 2X/week   Barriers to discharge Decreased caregiver support      Co-evaluation               AM-PAC PT "6 Clicks" Mobility  Outcome Measure Help needed turning from your back to your side while in a flat bed without using bedrails?: A Little Help needed moving from lying on your back to sitting on the side of a flat bed without using bedrails?: A Lot Help needed moving to and from a bed to a chair (including a wheelchair)?: A Little Help needed standing up from a chair using your arms (e.g., wheelchair or bedside chair)?: A Little Help needed to walk in hospital room?: A Little Help needed climbing 3-5 steps with a railing? : Total 6 Click Score: 15    End of Session Equipment Utilized During Treatment: Gait belt;Oxygen Activity Tolerance: Patient tolerated treatment well;Other (comment) (Oxygen desaturation) Patient left: in bed;with  call bell/phone within reach Nurse Communication: Mobility status PT Visit Diagnosis: Unsteadiness on feet (R26.81);Muscle weakness (generalized) (M62.81);History of falling (Z91.81)    Time: 7579-7282 PT Time Calculation (min) (ACUTE ONLY): 22 min   Charges:   PT Evaluation $PT Eval Low Complexity: 1 Low PT Treatments $Therapeutic Activity: 8-22 mins        Andrey Campanile, SPT   Andrey Campanile 12/12/2020, 2:21 PM

## 2020-12-12 NOTE — Evaluation (Signed)
Occupational Therapy Evaluation Patient Details Name: Thomas Matthews MRN: [id number] DOB: [birth year] Today's Date: 12/12/2020   History of Present Illness PT is a 87 year old male who was brought to ED by his son for AMS and 2 falls in the home 36 hours prior to admission. Pt found to have low hemoglobin and transfused. 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   Clinical Impression   Pt was seen for OT evaluation this date. Prior to hospital admission, pt was ambulating with a SPC, able to perform basic ADL independently, and had family support for most meals, med mgt/set up, and other IADL. Pt does still drive and denies falls aside from recent ones where he "slipped." Daughter comes by 3x/wk and she reports also having a sitter come 3x/wk. Dtr endorses pt is "forgetful" but able to maintain routines very well. Pt denied pain, SOB and agreeable to sit up on the EOB. Prior to EOB, pt noted with SpO2 87-89% on 10L O2 with HR indicating VTACH a couple times. Called RN who requested holding EOB/OOB at this time 2/2 vitals. Pt instructed in PLB to support breath recovery. Currently pt demonstrates impairments as described below (See OT problem list) which functionally limit his ability to perform ADL/self-care tasks. Pt currently requires MOD A for LB ADL, MIN A for UB ADL, anticipate assist required for bed mobility and ADL transfers. Pt would benefit from skilled OT services to address noted impairments and functional limitations (see below for any additional details) in order to maximize safety and independence while minimizing falls risk and caregiver burden. Upon hospital discharge, recommend STR to maximize pt safety and return to PLOF. Dtr in agreement.    Recommendations for follow up therapy are one component of a multi-disciplinary discharge planning process, led by the attending physician.  Recommendations may be updated based on patient status, additional functional criteria and insurance  authorization.   Follow Up Recommendations  Skilled nursing-short term rehab (<3 hours/day)    Assistance Recommended at Discharge Frequent or constant Supervision/Assistance  Functional Status Assessment  Patient has had a recent decline in their functional status and demonstrates the ability to make significant improvements in function in a reasonable and predictable amount of time.  Equipment Recommendations  [facility]    Recommendations for Other Services       Precautions / Restrictions Precautions Precautions: Fall Precaution Comments: monitor O2 sats Restrictions Weight Bearing Restrictions: No      Mobility Bed Mobility Overal bed mobility: Needs Assistance Bed Mobility: Supine to Sit;Sit to Supine     Supine to sit: Mod assist Sit to supine: Mod assist   General bed mobility comments: RN requested OT hold mobility 2/2 desat at rest and up to 10L O2    Transfers Overall transfer level: Needs assistance Equipment used: Rolling walker (2 wheels) Transfers: Sit to/from Stand Sit to Stand: Min assist;From elevated surface           General transfer comment: RN requested OT hold mobility 2/2 desat at rest and up to 10L O2      Balance Overall balance assessment: Needs assistance;History of Falls Sitting-balance support: Bilateral upper extremity supported;Feet supported Sitting balance-Leahy Scale: Fair Sitting balance - Comments: Requiring cues and assistance to maintain seated balance sitting at EOB   Standing balance support: Reliant on assistive device for balance;During functional activity Standing balance-Leahy Scale: Fair Standing balance comment: Requires AD and CGA for maintaining safety with sidestepping at EOB  ADL either performed or assessed with clinical judgement   ADL                                         General ADL Comments: Pt currently requires MOD A for LB ADL, MIN A for UB ADL,  desats even at rest on 10L O2 long sitting in bed. Unable to assess bed mobility and ADL transfers 2/2 vitals and RN request to hold after OT called RN with vitals update.     Vision         Perception     Praxis      Pertinent Vitals/Pain Pain Assessment: No/denies pain     Hand Dominance     Extremity/Trunk Assessment Upper Extremity Assessment Upper Extremity Assessment: Generalized weakness   Lower Extremity Assessment Lower Extremity Assessment: Generalized weakness RLE Deficits / Details: Decreased LE strength grossly 4/5. RLE Sensation: WNL LLE Deficits / Details: Decreased LE strength grossly 4/5. LLE Sensation: WNL       Communication Communication Communication: HOH   Cognition Arousal/Alertness: Awake/alert Behavior During Therapy: WFL for tasks assessed/performed Overall Cognitive Status: Within Functional Limits for tasks assessed                                 General Comments: HOH, but with time and repetition, pt able to respond appropriately     General Comments       Exercises Other Exercises Other Exercises: Pt instructed in pursed lip breathing to improve O2 sats.   Shoulder Instructions      Home Living Family/patient expects to be discharged to:: Private residence Living Arrangements: Alone Available Help at Discharge: Family;Available PRN/intermittently Type of Home: House Home Access: Stairs to enter [employer] of Steps: 4 Entrance Stairs-Rails: Left;Right;Can reach both Home Layout: One level     Bathroom Shower/Tub: Teacher, early years/pre: Standard     Home Equipment: Radio producer - single point   Additional Comments: Dtr reports she visits pt 3x/wk and she has a Actuary come out 3x/wk      Prior Functioning/Environment Prior Level of Function : Needs assist  Cognitive Assist : ADLs (cognitive)   ADLs (Cognitive): Set up cues (family provides set up for meds, pt then able  to follow routine well)       Mobility Comments: Pt reports ambulating with SPC until today when he used RW ADLs Comments: Family assists with meals (although pt able to boil eggs per dtr), set up meds. Pt still drives, is forgetful but does very well with set routines. Indep with bathing, dressing.        OT Problem List: Decreased strength;Cardiopulmonary status limiting activity;Decreased activity tolerance;Decreased knowledge of use of DME or AE      OT Treatment/Interventions: Self-care/ADL training;Therapeutic exercise;Therapeutic activities;Energy conservation;DME and/or AE instruction;Patient/family education;Balance training    OT Goals(Current goals can be found in the care plan section) Acute Rehab OT Goals Patient Stated Goal: go home OT Goal Formulation: With patient/family Time For Goal Achievement: 12/26/20 Potential to Achieve Goals: Good ADL Goals Pt Will Perform Lower Body Dressing: with min assist;sit to/from stand Pt Will Transfer to Toilet: with min assist;ambulating;bedside commode (LRAD) Additional ADL Goal #1: Pt will perform seated ADL tasks with set up and supervision, tolerating sitting EOB 5+ min with SpO2 >90% throughout.  OT Frequency: Min 2X/week   Barriers to D/C:            Co-evaluation              AM-PAC OT "6 Clicks" Daily Activity     Outcome Measure Help from another person eating meals?: None Help from another person taking care of personal grooming?: A Little Help from another person toileting, which includes using toliet, bedpan, or urinal?: A Lot Help from another person bathing (including washing, rinsing, drying)?: A Lot Help from another person to put on and taking off regular upper body clothing?: A Little Help from another person to put on and taking off regular lower body clothing?: A Lot 6 Click Score: 16   End of Session Equipment Utilized During Treatment: Oxygen Nurse Communication: Other (comment) (HR and O2  sats)  Activity Tolerance: Treatment limited secondary to medical complications (Comment) Patient left: in bed;with call bell/phone within reach;with family/visitor present  OT Visit Diagnosis: Other abnormalities of gait and mobility (R26.89);Muscle weakness (generalized) (M62.81)                Time: 5638-7564 OT Time Calculation (min): 30 min Charges:  OT General Charges $OT Visit: 1 Visit OT Evaluation $OT Eval Moderate Complexity: 1 Mod OT Treatments $Therapeutic Activity: 8-22 mins  Ardeth Perfect., MPH, MS, OTR/L ascom [phone] 12/12/20, 1:37 PM

## 2020-12-12 NOTE — Consult Note (Addendum)
[facility] Consultation Note  Patient ID: Thomas Matthews, MRN: [id number], DOB/AGE: 1923/08/11 87 y.o. Admit date: 12/11/2020   Date of Consult: 12/12/2020 Primary Physician: Birdie Sons, MD [facility] Cardiologist: [facility]  Chief Complaint:  Chief Complaint  Patient presents with   Tachycardia    Pt from home, family reports not acting right for past couple days. 2 falls in last 36 hours. Pt has pacemaker w/o defibrillator. Pt in vtach for EMS, given 150 amio and 150 mL NS en route. Pt bigemeny with 6-8 beat runs of vtach per EMS after medications.    Reason for Consult: HPI     Tachycardia    Additional comments: Pt from home, family reports not acting right for past couple days. 2 falls in last 36 hours. Pt has pacemaker w/o defibrillator. Pt in vtach for EMS, given 150 amio and 150 mL NS en route. Pt bigemeny with 6-8 beat runs of vtach per EMS after medications.       Last edited by Charleen Kirks, RN on 12/11/2020 12:52 PM.    NSVT and GIB with hx of Eliquis use  HPI: 87 y.o. male with PMH of severe AS s/p TAVR 2019, chronic combined systolic and diastolic HF EF 09%, chronic a-fib on eliquis, CAD with known LAD 80%, SSS s/p dual chamber pacer 2006 with battery change 2014, HTN, HLD, pulmonary HTN. Pt presented to the ED yesterday with concerns for family reports of "not acting right" and two falls. Patent was found to be in vtach by EMS and given amiodarone and normal saline for management with reports of continued NSVT by ED. Pt was noted to have blood in his stool at the ED with a decreased hemoglobin of 7.6 treated with transfusion of 1 unit PRBCs.   Patient has had improvement in Hgb t 11.7 after PRBC transfusion and states that he feels well right now. He denies any issues with chest pain, sob, palpitations, weakness, fatigue. He denies noticing any blood in his stool at home.   History reviewed. No pertinent past medical history.    Surgical History:  History reviewed. No pertinent surgical history.   Home Meds: Prior to Admission medications   Medication Sig Start Date End Date Taking? Authorizing Provider  apixaban (ELIQUIS) 2.5 MG TABS tablet Take 2.5 mg by mouth 2 (two) times daily. 11/13/17  Yes [provider]  calcium citrate-vitamin D (CITRACAL+D) 315-200 MG-UNIT tablet Take 2 tablets by mouth daily.   Yes [provider]  captopril (CAPOTEN) 12.5 MG tablet Take 6.25 mg by mouth 2 (two) times daily.   Yes [provider]  diltiazem (CARDIZEM) 30 MG tablet Take 30 mg by mouth 2 (two) times daily.   Yes [provider]  ferrous sulfate 325 (65 FE) MG tablet Take 324 mg by mouth every other day. 03/14/20  Yes [provider]  omeprazole (PRILOSEC) 20 MG capsule Take 20 mg by mouth daily.   Yes [provider]  oxybutynin (DITROPAN-XL) 10 MG 24 hr tablet Take 10 mg by mouth at bedtime.   Yes [provider]  pravastatin (PRAVACHOL) 20 MG tablet Take 20 mg by mouth daily.   Yes [provider]  tamsulosin (FLOMAX) 0.4 MG CAPS capsule Take 0.4 mg by mouth daily after breakfast. 11/11/11  Yes [provider]  tolterodine (DETROL LA) 4 MG 24 hr capsule Take 4 mg by mouth daily. 03/14/20  Yes [provider]  furosemide (LASIX) 20 MG tablet Take  20 mg by mouth. Patient not taking: No sig reported    [provider]    Inpatient Medications:   captopril  12.5 mg Oral BID   diltiazem  30 mg Oral BID   folic acid  1 mg Oral Daily   multivitamin with minerals  1 tablet Oral Daily   pantoprazole (PROTONIX) IV  40 mg Intravenous Q12H   potassium chloride  30 mEq Oral Q3H   tamsulosin  0.4 mg Oral QPC breakfast   thiamine  100 mg Oral Daily    azithromycin     cefTRIAXone (ROCEPHIN)  IV      Allergies:  Allergies  Allergen Reactions   Amlodipine Besylate Swelling   Felodipine Swelling   Oxybutynin Chloride Other (See Comments)    Other  reaction(s): Dizziness Tolerates taking at night   Simvastatin     Other reaction(s): Dizziness   Terazosin     Other reaction(s): Low blood pressure    Social History   Socioeconomic History   Marital status: Married    Spouse name: Not on file   Number of children: Not on file   Years of education: Not on file   Highest education level: Not on file  Occupational History   Not on file  Tobacco Use   Smoking status: Former   Smokeless tobacco: Never  Substance and Sexual Activity   Alcohol use: No   Drug use: No   Sexual activity: Not on file  Other Topics Concern   Not on file  Social History Narrative   Not on file   Social Determinants of Health   Financial Resource Strain: Not on file  Food Insecurity: Not on file  Transportation Needs: Not on file  Physical Activity: Not on file  Stress: Not on file  Social Connections: Not on file  Intimate Partner Violence: Not on file     History reviewed. No pertinent family history.   Review of Systems Positive for none Negative for: General:  chills, fever, night sweats or weight changes.  Cardiovascular: PND orthopnea syncope dizziness  Dermatological skin lesions rashes Respiratory: Cough congestion Urologic: Frequent urination urination at night and hematuria Abdominal: negative for nausea, vomiting, diarrhea, bright red blood per rectum, melena, or hematemesis Neurologic: negative for visual changes, and/or hearing changes  All other systems reviewed and are otherwise negative except as noted above.  Labs: No results for input(s): CKTOTAL, CKMB, TROPONINI in the last 72 hours. Lab Results  Component Value Date   WBC 16.0 (H) 12/12/2020   HGB 11.7 (L) 12/12/2020   HCT 33.5 (L) 12/12/2020   MCV 95.4 12/12/2020   PLT 189 12/12/2020    Recent Labs  Lab 12/12/20 0641  NA 140  K 3.0*  CL 103  CO2 25  BUN 28*  CREATININE 0.86  CALCIUM 8.5*  PROT 6.4*  BILITOT 2.0*  ALKPHOS 64  ALT 26  AST 61*   GLUCOSE 113*   No results found for: CHOL, HDL, LDLCALC, TRIG No results found for: DDIMER  Radiology/Studies:  CT Head Wo Contrast  Result Date: 12/11/2020 CLINICAL DATA:  Head trauma, minor.  Neck trauma.  Fall. EXAM: CT HEAD WITHOUT CONTRAST CT CERVICAL SPINE WITHOUT CONTRAST TECHNIQUE: Multidetector CT imaging of the head and cervical spine was performed following the standard protocol without intravenous contrast. Multiplanar CT image reconstructions of the cervical spine were also generated. COMPARISON:  None. FINDINGS: CT HEAD FINDINGS Brain: Mild for age generalized cerebral atrophy. Advanced patchy and confluent hypoattenuation  within the cerebral white matter, nonspecific but compatible chronic small vessel ischemic disease. There is no acute intracranial hemorrhage. No demarcated cortical infarct. No extra-axial fluid collection. No evidence of an intracranial mass. No midline shift. Vascular: No hyperdense vessel.  Atherosclerotic calcifications. Skull: Normal. Negative for fracture or focal lesion. Sinuses/Orbits: Visualized orbits show no acute finding. Minimal mucosal thickening within the bilateral ethmoid, left sphenoid and left maxillary sinuses. Other: Trace fluid within the right mastoid air cells. Biparietal scalp soft tissue swelling/hematoma. CT CERVICAL SPINE FINDINGS Alignment: No significant spondylolisthesis. Skull base and vertebrae: The basion-dental and atlanto-dental intervals are maintained.No evidence of acute fracture to the cervical spine. Soft tissues and spinal canal: No prevertebral fluid or swelling. No visible canal hematoma. Disc levels: At C2-C3, there is early osseous fusion across the disc space and facet joint ankylosis on the right. Degenerative appearing fusion across the C5-C6 disc space. Additional cervical spondylosis with multilevel disc space narrowing, disc bulges/central disc protrusions, posterior disc osteophytes, endplate spurring, uncovertebral  hypertrophy and facet arthrosis. Disc space narrowing is advanced at C3-C4, C4-C5 and C6-C7. No appreciable severe spinal canal stenosis. Multilevel bony neural foraminal narrowing. Upper chest: No consolidation within the imaged lung apices. No visible pneumothorax. Prominent biapical emphysema and bullae/blebs. IMPRESSION: CT head: 1. No evidence of acute intracranial abnormality. 2. Advanced chronic small vessel ischemic changes within the cerebral white matter. 3. Mild for age generalized cerebral atrophy. 4. Biparietal scalp soft tissue swelling/hematoma. 5. Mild paranasal sinus mucosal thickening. 6. Trace fluid within the right mastoid air cells. CT cervical spine: 1. No evidence of acute fracture to the cervical spine. 2. Cervical spondylosis and multilevel degenerative appearing ankylosis, as described. 3.  Emphysema (ICD10-J43.9). Electronically Signed   By: Kellie Simmering D.O.   On: 12/11/2020 16:02   CT Cervical Spine Wo Contrast  Result Date: 12/11/2020 CLINICAL DATA:  Head trauma, minor.  Neck trauma.  Fall. EXAM: CT HEAD WITHOUT CONTRAST CT CERVICAL SPINE WITHOUT CONTRAST TECHNIQUE: Multidetector CT imaging of the head and cervical spine was performed following the standard protocol without intravenous contrast. Multiplanar CT image reconstructions of the cervical spine were also generated. COMPARISON:  None. FINDINGS: CT HEAD FINDINGS Brain: Mild for age generalized cerebral atrophy. Advanced patchy and confluent hypoattenuation within the cerebral white matter, nonspecific but compatible chronic small vessel ischemic disease. There is no acute intracranial hemorrhage. No demarcated cortical infarct. No extra-axial fluid collection. No evidence of an intracranial mass. No midline shift. Vascular: No hyperdense vessel.  Atherosclerotic calcifications. Skull: Normal. Negative for fracture or focal lesion. Sinuses/Orbits: Visualized orbits show no acute finding. Minimal mucosal thickening within the  bilateral ethmoid, left sphenoid and left maxillary sinuses. Other: Trace fluid within the right mastoid air cells. Biparietal scalp soft tissue swelling/hematoma. CT CERVICAL SPINE FINDINGS Alignment: No significant spondylolisthesis. Skull base and vertebrae: The basion-dental and atlanto-dental intervals are maintained.No evidence of acute fracture to the cervical spine. Soft tissues and spinal canal: No prevertebral fluid or swelling. No visible canal hematoma. Disc levels: At C2-C3, there is early osseous fusion across the disc space and facet joint ankylosis on the right. Degenerative appearing fusion across the C5-C6 disc space. Additional cervical spondylosis with multilevel disc space narrowing, disc bulges/central disc protrusions, posterior disc osteophytes, endplate spurring, uncovertebral hypertrophy and facet arthrosis. Disc space narrowing is advanced at C3-C4, C4-C5 and C6-C7. No appreciable severe spinal canal stenosis. Multilevel bony neural foraminal narrowing. Upper chest: No consolidation within the imaged lung apices. No visible pneumothorax. Prominent biapical emphysema  and bullae/blebs. IMPRESSION: CT head: 1. No evidence of acute intracranial abnormality. 2. Advanced chronic small vessel ischemic changes within the cerebral white matter. 3. Mild for age generalized cerebral atrophy. 4. Biparietal scalp soft tissue swelling/hematoma. 5. Mild paranasal sinus mucosal thickening. 6. Trace fluid within the right mastoid air cells. CT cervical spine: 1. No evidence of acute fracture to the cervical spine. 2. Cervical spondylosis and multilevel degenerative appearing ankylosis, as described. 3.  Emphysema (ICD10-J43.9). Electronically Signed   By: Kellie Simmering D.O.   On: 12/11/2020 16:02   DG Chest Portable 1 View  Result Date: 12/11/2020 CLINICAL DATA:  Brief episodes of ventricular tachycardia, slightly altered Mental status, history hypertension, coronary artery disease EXAM: PORTABLE CHEST  1 VIEW COMPARISON:  Portable exam 1319 hours compared to 06/10/2011 FINDINGS: LEFT subclavian sequential transvenous pacemaker leads project at RIGHT atrium and RIGHT ventricle. Enlargement of cardiac silhouette post AVR. Mediastinal contours and pulmonary vascularity normal with atherosclerotic calcification of the aortic arch. Infiltrates identified throughout RIGHT lung, question asymmetric edema versus infection. Skin folds project over the lower RIGHT chest. Tiny bibasilar pleural effusions. No pneumothorax Bones demineralized. IMPRESSION: Enlargement of cardiac silhouette post TAVR pacemaker. Asymmetric interstitial infiltrates throughout RIGHT lung with tiny bibasilar effusions, Western asymmetric pulmonary edema versus atypical infection. Electronically Signed   By: Lavonia Dana M.D.   On: 12/11/2020 14:04    EKG: atrial fibrillation with nonspecific interventricular conduction delay and very frequent PVCs  Weights: Filed Weights   12/11/20 1253  Weight: 71.8 kg     Physical Exam: Blood pressure (!) 155/79, pulse 87, temperature 98.7 F (37.1 C), temperature source Oral, resp. rate 17, height 5\' 7"  (1.702 m), weight 71.8 kg, SpO2 98 %. Body mass index is 24.79 kg/m. General: elderly and frail appearing in no acute distress. Head eyes ears nose throat: Normocephalic, atraumatic, sclera non-icteric, no xanthomas, nares are without discharge. No apparent thyromegaly and/or mass  Lungs: Normal respiratory effort.  no wheezes, no rales, no rhonchi.  Heart:irregular with normal S1 S2. no murmur gallop, no rub, PMI is normal size and placement, carotid upstroke normal without bruit, jugular venous pressure is normal Abdomen: Soft, non-tender, non-distended with normoactive bowel sounds. No hepatomegaly. No rebound/guarding. No obvious abdominal masses. Abdominal aorta is normal size without bruit Extremities: No edema. no cyanosis, no clubbing, no ulcers  Peripheral : 2+ bilateral upper  extremity pulses, 2+ bilateral femoral pulses, 2+ bilateral dorsal pedal pulse Neuro: Alert and oriented. No facial asymmetry. No focal deficit. Moves all extremities spontaneously. Musculoskeletal: Normal muscle tone without kyphosis Psych:  Responds to questions appropriately with a normal affect.    Assessment: 87 y.o. male with PMH of severe AS s/p TAVR 2019, chronic combined systolic and diastolic HF EF 32%, chronic a-fib on eliquis, CAD with known LAD 80%, SSS s/p dual chamber pacer 2006 with battery change 2014, HTN, HLD, pulmonary HTN with concerns for several runs of NSVT as well as possible GI bleed on Eliquis.  Plan: -Continue to hold Eliquis at this time pending GI evaluation for possible GI bleed -Continue current outpatient medications for heart rate control and rhythm control in the setting of NSVT and atrial fibrillation with diltiazem -Continue current management of symptomatic anemia and GI bleed -no further cardiac diagnostic testing necessary at this time   Signed, Jettie Booze [facility] 12/12/2020, 1:25 PM  The patient has been interviewed and examined. I agree with assessment and plan above. Serafina Royals MD [facility]

## 2020-12-12 NOTE — ED Notes (Signed)
Pt's oxygen sats fall between 86%-91% with pt resting in bed. Respiratory contacted due to pt now being on 6L BNC with the same sats. Pt in NAD in bed.

## 2020-12-12 NOTE — ED Notes (Signed)
Pt had 5 runs of v tach. EDP [place] reviewed strip. Orders placed. Hospitalist made aware.

## 2020-12-12 NOTE — ED Notes (Signed)
Pt had a large BM. Pt cleaned up by this Probation officer and the NT. Linen changed. Pt denies any further needs.

## 2020-12-12 NOTE — Progress Notes (Signed)
PROGRESS NOTE    Thomas Matthews  DGL:[id number] DOB: 1923/06/18 DOA: 12/11/2020 PCP: Birdie Sons, MD    Brief Narrative:  Thomas Matthews is a 87 year old male with past medical history significant for essential hypertension, hyperlipidemia, BPH, GERD, iron deficiency anemia, Hx aortic stenosis s/p TAVR, cataract/macular degeneration, paroxysmal atrial fibrillation on Eliquis, CAD, SSS s/p PPM, chronic combined systolic and diastolic CHF (EF 51% TTE 88/41/66), pulmonary HTN who presented to [facility] ED on 10/26 via EMS due to confusion and multiple falls.  On EMS arrival, patient was noted to be in nonsustained ventricular tachycardia and was given 150 mg IV amiodarone and NS bolus in route to the hospital.  Also noted to be in bigeminy with recurrent runs of NSVT.  She reports large bowel movements recently but did not know if it contained any blood.  In the ED, temperature 97.4 F, HR 81, RR 16, BP 175/72, SPO2 95% on 2 L nasal cannula.  WBC 12.4, hemoglobin 7.6, platelets 184.  Sodium 140, potassium 3.4, chloride 110, CO2 24, glucose 161, BUN 36, creatinine 0.78, AST 55, ALT 21, total bilirubin 1.2.  BNP 847.4.  High-sensitivity troponin 119>147.  COVID-19 PCR negative.  Influenza A/B PCR negative.  Chest x-ray with enlargement of cardiac silhouette, noted to have replacement, asymmetric interstitial infiltrates right lung with bibasilar effusions, asymmetric pulmonary edema versus atypical infection.  CT head without contrast with no evidence of acute intracranial roundly, advanced chronic small vessel ischemic changes, generalized cerebral atrophy, biparietal scalp soft tissue swelling/hematoma, no evidence of acute fracture C-spine, multilevel degenerative changes with cervical spondylosis C-spine.   Assessment & Plan:   Active Problems:   Symptomatic anemia   GI bleeding   NSVT (nonsustained ventricular tachycardia)   Head trauma   History of transcatheter aortic valve replacement  (TAVR)   Acute metabolic encephalopathy, POA Patient presented via EMS after being found by family confused.  Patient is afebrile, with mildly elevated WBC count of 12.4.  Chest x-ray with findings of pulmonary edema versus atypical infection.  CT head with no acute intracranial findings.  Etiology likely multifactorial in the setting of symptomatic anemia, possible atypical pulmonary infection, CHF exacerbation, and NSVT. --Continue treatment as below  Symptomatic anemia Hx IDA Concern for GI bleed Patient presenting with hemoglobin 7.6.  History of iron deficiency anemia on home ferrous sulfate.  Anemia panel with iron 39, TIBC 325 and ferritin 64. --FOBT pending --Protonix 40 mg IV q12h --IV iron daily x 2 --H&H every 8 hours; transfuse for hemoglobin less than 8.0 given cardiac issues --If FOBT positive, will consider GI evaluation; but likely poor candidate at this time given his high O2 needs  Nonsustained ventricular tachycardia Upon EMS arrival, patient was noted to be in ventricular tachycardia, received 150 mg IV bolus of amiodarone.  On telemetry patient continues to have intermittent NSVT. --Replacing electrolytes; goal K >4.0 and Mg >2.0 --Transfuse as above --Cardiology consulted for further consideration --Continue monitor on telemetry  Community-acquired pneumonia Chest x-ray with findings of asymmetric interstitial infiltrates right lung --SLP evaluation --Azithromycin 500mg  IV q24h x 5 days --Ceftriaxone 2 g IV q24h x 5 days --CBC daily  Elevated troponin High sensitive troponin elevated on admission, 114>147; etiology likely secondary to demand type II ischemia from NSVT, anemia and CHF exacerbation.  Currently reports chest pain-free. --Continue monitor on telemetry  Acute hypoxic respiratory failure, POA Acute on chronic combined systolic/diastolic congestive heart failure Patient notably hypoxic via EMS, placed on nasal cannula.  Became  more short of breath  requiring increased oxygen demand following transfusion IV fluid hydration on admission.  Chest x-ray notable for pulmonary edema versus asymmetric infection with bilateral pleural effusions.  BNP elevated.  Most recent TTE in care everywhere with LVEF 50% on 12/28/2019. --Repeat TTE --Furosemide 40 mg IV q12h --Continue supplemental oxygen, maintain SPO2 greater than 92%, on 10 L HFNC (not on home O2) --Strict I's and O's and daily weights  Paroxysmal atrial fibrillation --Holding home Eliquis as above due to concern for GI bleed, symptomatic anemia --Continue Cardizem 30 mg p.o. twice daily --Continue monitor on telemetry  Hypokalemia Potassium 3.0 this morning, will replete. --Follow electrolytes daily  Essential hypertension --Increase home captopril to 12.5 mg p.o. twice daily --Diltiazem 30 mg p.o. twice daily --Hydralazine 25 mg PO q6h prn SBP >170 or DBP >110  BPH: Tamsulosin 0.4 mg p.o. daily  GERD: On omeprazole at home. --Protonix 40mg  IV q12h as above  Hx aortic stenosis s/p TAVR --Outpatient follow-up with cardiology   DVT prophylaxis: Place and maintain sequential compression device Start: 12/12/20 1410Place and maintain sequential compression device Start: 12/12/20 1410   Code Status: DNR Family Communication:   Disposition Plan:  Level of care: Med-Surg Status is: Inpatient  Remains inpatient appropriate because: Continues with confusion, symptomatic anemia, recurrent NSVT.   Consultants:  Cardiology  Procedures:  None  Antimicrobials:  Azithromycin Ceftriaxone   Subjective: Patient seen examined at bedside, resting comfortably.  Continues in ED holding area.  No family present at bedside.  Remains pleasantly confused.  Asked "how did you get in the room".  No complaints, denies pain.  Unable obtain any further ROS due to his current mental status.  Stated patient's daughter and son via telephone this afternoon.  No acute events overnight other  than recurrent NSVT episodes per nursing staff.  Objective: Vitals:   12/12/20 0830 12/12/20 1211 12/12/20 1355 12/12/20 1415  BP: (!) 160/87 (!) 155/79 (!) 150/71   Pulse: 90 87 79   Resp: 19 17 (!) 22   Temp:  98.7 F (37.1 C) 98.8 F (37.1 C)   TempSrc:  Oral    SpO2: 93% 98% 92% 95%  Weight:      Height:        Intake/Output Summary (Last 24 hours) at 12/12/2020 1440 Last data filed at 12/12/2020 1211 Gross per 24 hour  Intake 2136.32 ml  Output 2150 ml  Net -13.68 ml   Filed Weights   12/11/20 1253  Weight: 71.8 kg    Examination:  General exam: Appears calm and comfortable, pleasantly confused, chronically ill in appearance Respiratory system: Decreased breath sounds bilateral bases, mild crackles, normal [place] effort without accessory muscle use, on 10 L HFNC Cardiovascular system: S1 & S2 heard, RRR. No JVD, murmurs, rubs, gallops or clicks. No pedal edema. Gastrointestinal system: Abdomen is nondistended, soft and nontender. No organomegaly or masses felt. Normal bowel sounds heard. Central nervous system: Alert, not oriented to person/place/time or situation. No focal neurological deficits. Extremities: Symmetric 5 x 5 power.  Moving all extremities independently Skin: No rashes, lesions or ulcers Psychiatry: Judgement and insight appear poor. Mood & affect appropriate.     Data Reviewed: I have personally reviewed following labs and imaging studies  CBC: Recent Labs  Lab 12/11/20 1302 12/12/20 0018 12/12/20 0641  WBC 12.4*  --  16.0*  NEUTROABS 10.8*  --   --   HGB 7.6* 10.9* 11.7*  HCT 22.6* 31.7* 33.5*  MCV 105.6*  --  95.4  PLT 184  --  347   Basic Metabolic Panel: Recent Labs  Lab 12/11/20 1302 12/12/20 0641  NA 140 140  K 3.4* 3.0*  CL 110 103  CO2 24 25  GLUCOSE 161* 113*  BUN 36* 28*  CREATININE 0.78 0.86  CALCIUM 8.3* 8.5*  MG  --  1.9   GFR: Estimated Creatinine Clearance: 48 mL/min (by C-G formula based on SCr of 0.86  mg/dL). Liver Function Tests: Recent Labs  Lab 12/11/20 1302 12/12/20 0641  AST 55* 61*  ALT 21 26  ALKPHOS 59 64  BILITOT 1.2 2.0*  PROT 6.1* 6.4*  ALBUMIN 3.4* 3.3*   No results for input(s): LIPASE, AMYLASE in the last 168 hours. No results for input(s): AMMONIA in the last 168 hours. Coagulation Profile: Recent Labs  Lab 12/12/20 0641  INR 1.3*   Cardiac Enzymes: No results for input(s): CKTOTAL, CKMB, CKMBINDEX, TROPONINI in the last 168 hours. BNP (last 3 results) No results for input(s): PROBNP in the last 8760 hours. HbA1C: No results for input(s): HGBA1C in the last 72 hours. CBG: No results for input(s): GLUCAP in the last 168 hours. Lipid Profile: No results for input(s): CHOL, HDL, LDLCALC, TRIG, CHOLHDL, LDLDIRECT in the last 72 hours. Thyroid Function Tests: No results for input(s): TSH, T4TOTAL, FREET4, T3FREE, THYROIDAB in the last 72 hours. Anemia Panel: Recent Labs    12/11/20 1302 12/12/20 0641  VITAMINB12  --  182  FERRITIN 64  --   TIBC 325  --   IRON 39*  --   RETICCTPCT  --  4.2*   Sepsis Labs: Recent Labs  Lab 12/11/20 1539  PROCALCITON <0.10    Recent Results (from the past 240 hour(s))  Resp Panel by RT-PCR (Flu A&B, Covid) Nasopharyngeal Swab     Status: None   Collection Time: 12/12/20 12:46 AM   Specimen: Nasopharyngeal Swab; Nasopharyngeal(NP) swabs in vial transport medium  Result Value Ref Range Status   SARS Coronavirus 2 by RT PCR NEGATIVE NEGATIVE Final    Comment: (NOTE) SARS-CoV-2 target nucleic acids are NOT DETECTED.  The SARS-CoV-2 RNA is generally detectable in upper respiratory specimens during the acute phase of infection. The lowest concentration of SARS-CoV-2 viral copies this assay can detect is 138 copies/mL. A negative result does not preclude SARS-Cov-2 infection and should not be used as the sole basis for treatment or other patient management decisions. A negative result may occur with  improper  specimen collection/handling, submission of specimen other than nasopharyngeal swab, presence of viral mutation(s) within the areas targeted by this assay, and inadequate number of viral copies(<138 copies/mL). A negative result must be combined with clinical observations, patient history, and epidemiological information. The expected result is Negative.  Fact Sheet for Patients:  EntrepreneurPulse.com.au  Fact Sheet for Healthcare Providers:  IncredibleEmployment.be  This test is no t yet approved or cleared by the [place] FDA and  has been authorized for detection and/or diagnosis of SARS-CoV-2 by FDA under an Emergency Use Authorization (EUA). This EUA will remain  in effect (meaning this test can be used) for the duration of the COVID-19 declaration under Section 564(b)(1) of the Act, 21 U.S.C.section 360bbb-3(b)(1), unless the authorization is terminated  or revoked sooner.       Influenza A by PCR NEGATIVE NEGATIVE Final   Influenza B by PCR NEGATIVE NEGATIVE Final    Comment: (NOTE) The Xpert Xpress SARS-CoV-2/FLU/RSV plus assay is intended as an aid in the diagnosis of influenza  from Nasopharyngeal swab specimens and should not be used as a sole basis for treatment. Nasal washings and aspirates are unacceptable for Xpert Xpress SARS-CoV-2/FLU/RSV testing.  Fact Sheet for Patients: EntrepreneurPulse.com.au  Fact Sheet for Healthcare Providers: IncredibleEmployment.be  This test is not yet approved or cleared by the [place] FDA and has been authorized for detection and/or diagnosis of SARS-CoV-2 by FDA under an Emergency Use Authorization (EUA). This EUA will remain in effect (meaning this test can be used) for the duration of the COVID-19 declaration under Section 564(b)(1) of the Act, 21 U.S.C. section 360bbb-3(b)(1), unless the authorization is terminated or revoked.  Performed at  [facility], [address], [place], [place] [address]          Radiology Studies: CT Head Wo Contrast  Result Date: 12/11/2020 CLINICAL DATA:  Head trauma, minor.  Neck trauma.  Fall. EXAM: CT HEAD WITHOUT CONTRAST CT CERVICAL SPINE WITHOUT CONTRAST TECHNIQUE: Multidetector CT imaging of the head and cervical spine was performed following the standard protocol without intravenous contrast. Multiplanar CT image reconstructions of the cervical spine were also generated. COMPARISON:  None. FINDINGS: CT HEAD FINDINGS Brain: Mild for age generalized cerebral atrophy. Advanced patchy and confluent hypoattenuation within the cerebral white matter, nonspecific but compatible chronic small vessel ischemic disease. There is no acute intracranial hemorrhage. No demarcated cortical infarct. No extra-axial fluid collection. No evidence of an intracranial mass. No midline shift. Vascular: No hyperdense vessel.  Atherosclerotic calcifications. Skull: Normal. Negative for fracture or focal lesion. Sinuses/Orbits: Visualized orbits show no acute finding. Minimal mucosal thickening within the bilateral ethmoid, left sphenoid and left maxillary sinuses. Other: Trace fluid within the right mastoid air cells. Biparietal scalp soft tissue swelling/hematoma. CT CERVICAL SPINE FINDINGS Alignment: No significant spondylolisthesis. Skull base and vertebrae: The basion-dental and atlanto-dental intervals are maintained.No evidence of acute fracture to the cervical spine. Soft tissues and spinal canal: No prevertebral fluid or swelling. No visible canal hematoma. Disc levels: At C2-C3, there is early osseous fusion across the disc space and facet joint ankylosis on the right. Degenerative appearing fusion across the C5-C6 disc space. Additional cervical spondylosis with multilevel disc space narrowing, disc bulges/central disc protrusions, posterior disc osteophytes, endplate spurring, uncovertebral hypertrophy and  facet arthrosis. Disc space narrowing is advanced at C3-C4, C4-C5 and C6-C7. No appreciable severe spinal canal stenosis. Multilevel bony neural foraminal narrowing. Upper chest: No consolidation within the imaged lung apices. No visible pneumothorax. Prominent biapical emphysema and bullae/blebs. IMPRESSION: CT head: 1. No evidence of acute intracranial abnormality. 2. Advanced chronic small vessel ischemic changes within the cerebral white matter. 3. Mild for age generalized cerebral atrophy. 4. Biparietal scalp soft tissue swelling/hematoma. 5. Mild paranasal sinus mucosal thickening. 6. Trace fluid within the right mastoid air cells. CT cervical spine: 1. No evidence of acute fracture to the cervical spine. 2. Cervical spondylosis and multilevel degenerative appearing ankylosis, as described. 3.  Emphysema (ICD10-J43.9). Electronically Signed   By: Kellie Simmering D.O.   On: 12/11/2020 16:02   CT Cervical Spine Wo Contrast  Result Date: 12/11/2020 CLINICAL DATA:  Head trauma, minor.  Neck trauma.  Fall. EXAM: CT HEAD WITHOUT CONTRAST CT CERVICAL SPINE WITHOUT CONTRAST TECHNIQUE: Multidetector CT imaging of the head and cervical spine was performed following the standard protocol without intravenous contrast. Multiplanar CT image reconstructions of the cervical spine were also generated. COMPARISON:  None. FINDINGS: CT HEAD FINDINGS Brain: Mild for age generalized cerebral atrophy. Advanced patchy and confluent hypoattenuation within the cerebral  white matter, nonspecific but compatible chronic small vessel ischemic disease. There is no acute intracranial hemorrhage. No demarcated cortical infarct. No extra-axial fluid collection. No evidence of an intracranial mass. No midline shift. Vascular: No hyperdense vessel.  Atherosclerotic calcifications. Skull: Normal. Negative for fracture or focal lesion. Sinuses/Orbits: Visualized orbits show no acute finding. Minimal mucosal thickening within the bilateral  ethmoid, left sphenoid and left maxillary sinuses. Other: Trace fluid within the right mastoid air cells. Biparietal scalp soft tissue swelling/hematoma. CT CERVICAL SPINE FINDINGS Alignment: No significant spondylolisthesis. Skull base and vertebrae: The basion-dental and atlanto-dental intervals are maintained.No evidence of acute fracture to the cervical spine. Soft tissues and spinal canal: No prevertebral fluid or swelling. No visible canal hematoma. Disc levels: At C2-C3, there is early osseous fusion across the disc space and facet joint ankylosis on the right. Degenerative appearing fusion across the C5-C6 disc space. Additional cervical spondylosis with multilevel disc space narrowing, disc bulges/central disc protrusions, posterior disc osteophytes, endplate spurring, uncovertebral hypertrophy and facet arthrosis. Disc space narrowing is advanced at C3-C4, C4-C5 and C6-C7. No appreciable severe spinal canal stenosis. Multilevel bony neural foraminal narrowing. Upper chest: No consolidation within the imaged lung apices. No visible pneumothorax. Prominent biapical emphysema and bullae/blebs. IMPRESSION: CT head: 1. No evidence of acute intracranial abnormality. 2. Advanced chronic small vessel ischemic changes within the cerebral white matter. 3. Mild for age generalized cerebral atrophy. 4. Biparietal scalp soft tissue swelling/hematoma. 5. Mild paranasal sinus mucosal thickening. 6. Trace fluid within the right mastoid air cells. CT cervical spine: 1. No evidence of acute fracture to the cervical spine. 2. Cervical spondylosis and multilevel degenerative appearing ankylosis, as described. 3.  Emphysema (ICD10-J43.9). Electronically Signed   By: Kellie Simmering D.O.   On: 12/11/2020 16:02   DG Chest Portable 1 View  Result Date: 12/11/2020 CLINICAL DATA:  Brief episodes of ventricular tachycardia, slightly altered Mental status, history hypertension, coronary artery disease EXAM: PORTABLE CHEST 1 VIEW  COMPARISON:  Portable exam 1319 hours compared to 06/10/2011 FINDINGS: LEFT subclavian sequential transvenous pacemaker leads project at RIGHT atrium and RIGHT ventricle. Enlargement of cardiac silhouette post AVR. Mediastinal contours and pulmonary vascularity normal with atherosclerotic calcification of the aortic arch. Infiltrates identified throughout RIGHT lung, question asymmetric edema versus infection. Skin folds project over the lower RIGHT chest. Tiny bibasilar pleural effusions. No pneumothorax Bones demineralized. IMPRESSION: Enlargement of cardiac silhouette post TAVR pacemaker. Asymmetric interstitial infiltrates throughout RIGHT lung with tiny bibasilar effusions, Western asymmetric pulmonary edema versus atypical infection. Electronically Signed   By: Lavonia Dana M.D.   On: 12/11/2020 14:04        Scheduled Meds:  captopril  12.5 mg Oral BID   diltiazem  30 mg Oral BID   folic acid  1 mg Oral Daily   furosemide  40 mg Intravenous BID   multivitamin with minerals  1 tablet Oral Daily   pantoprazole (PROTONIX) IV  40 mg Intravenous Q12H   tamsulosin  0.4 mg Oral QPC breakfast   thiamine  100 mg Oral Daily   Continuous Infusions:  azithromycin     cefTRIAXone (ROCEPHIN)  IV     ferric gluconate (FERRLECIT) IVPB       LOS: 1 day    Time spent: 39 minutes spent on chart review, discussion with nursing staff, consultants, updating family and interview/physical exam; more than 50% of that time was spent in counseling and/or coordination of care.    Aluel Schwarz J [place] DO [facility] Available via [employer]  secure chat 7am-7pm After these hours, please refer to coverage provider listed on [email or web address] 12/12/2020, 2:40 PM

## 2020-12-12 NOTE — Progress Notes (Signed)
Patient noted to have drop in O2 saturations intermittently.  X-ray of the chest earlier did note possibility of asymmetric edema.  Will give an additional 20 mg of Lasix IV given patient had received 1 unit of packed red blood cells and hold IV fluids.

## 2020-12-13 ENCOUNTER — Inpatient Hospital Stay
Admit: 2020-12-13 | Discharge: 2020-12-13 | Disposition: A | Discharge status: Home / Self Care | Payer: Medicare Other | Attending: Internal Medicine | Admitting: Internal Medicine

## 2020-12-13 DIAGNOSIS — L8990 Pressure ulcer of unspecified site, unspecified stage: Secondary | ICD-10-CM | POA: Diagnosis present

## 2020-12-13 DIAGNOSIS — I4729 Other ventricular tachycardia: Secondary | ICD-10-CM | POA: Diagnosis not present

## 2020-12-13 DIAGNOSIS — I5043 Acute on chronic combined systolic (congestive) and diastolic (congestive) heart failure: Secondary | ICD-10-CM

## 2020-12-13 DIAGNOSIS — S0990XA Unspecified injury of head, initial encounter: Secondary | ICD-10-CM | POA: Diagnosis not present

## 2020-12-13 DIAGNOSIS — Z952 Presence of prosthetic heart valve: Secondary | ICD-10-CM | POA: Diagnosis not present

## 2020-12-13 DIAGNOSIS — D649 Anemia, unspecified: Secondary | ICD-10-CM | POA: Diagnosis not present

## 2020-12-13 LAB — HEMOGLOBIN AND HEMATOCRIT, BLOOD
HCT: 30.3 % — ABNORMAL LOW (ref 39.0–52.0)
Hemoglobin: 10 g/dL — ABNORMAL LOW (ref 13.0–17.0)

## 2020-12-13 LAB — MAGNESIUM: Magnesium: 1.9 mg/dL (ref 1.7–2.4)

## 2020-12-13 LAB — COMPREHENSIVE METABOLIC PANEL
ALT: 22 U/L (ref 0–44)
AST: 43 U/L — ABNORMAL HIGH (ref 15–41)
Albumin: 2.7 g/dL — ABNORMAL LOW (ref 3.5–5.0)
Alkaline Phosphatase: 48 U/L (ref 38–126)
Anion gap: 7 (ref 5–15)
BUN: 25 mg/dL — ABNORMAL HIGH (ref 8–23)
CO2: 30 mmol/L (ref 22–32)
Calcium: 8 mg/dL — ABNORMAL LOW (ref 8.9–10.3)
Chloride: 103 mmol/L (ref 98–111)
Creatinine, Ser: 0.84 mg/dL (ref 0.61–1.24)
GFR, Estimated: >60 mL/min (ref >60)
Glucose, Bld: 107 mg/dL — ABNORMAL HIGH (ref 70–99)
Potassium: 3.4 mmol/L — ABNORMAL LOW (ref 3.5–5.1)
Sodium: 140 mmol/L (ref 135–145)
Total Bilirubin: 1.3 mg/dL — ABNORMAL HIGH (ref 0.3–1.2)
Total Protein: 5.6 g/dL — ABNORMAL LOW (ref 6.5–8.1)

## 2020-12-13 LAB — ECHOCARDIOGRAM COMPLETE
AR max vel: 0.8 cm2
AV Area VTI: 0.86 cm2
AV Area mean vel: 0.81 cm2
AV Mean grad: 8.7 mmHg
AV Peak grad: 16.3 mmHg
Ao pk vel: 2.02 m/s
Area-P 1/2: 4.54 cm2
Height: 67 in
MV VTI: 1.15 cm2
S' Lateral: 3.9 cm
Weight: 2299.84 oz

## 2020-12-13 LAB — CBC
HCT: 30.7 % — ABNORMAL LOW (ref 39.0–52.0)
Hemoglobin: 10.5 g/dL — ABNORMAL LOW (ref 13.0–17.0)
MCH: 32.4 pg (ref 26.0–34.0)
MCHC: 34.2 g/dL (ref 30.0–36.0)
MCV: 94.8 fL (ref 80.0–100.0)
Platelets: 145 10*3/uL — ABNORMAL LOW (ref 150–400)
RBC: 3.24 MIL/uL — ABNORMAL LOW (ref 4.22–5.81)
RDW: 20.9 % — ABNORMAL HIGH (ref 11.5–15.5)
WBC: 11.2 10*3/uL — ABNORMAL HIGH (ref 4.0–10.5)
nRBC: 0.4 % — ABNORMAL HIGH (ref 0.0–0.2)

## 2020-12-13 MED ORDER — POTASSIUM CHLORIDE CRYS ER 20 MEQ PO TBCR
30.0000 meq | EXTENDED_RELEASE_TABLET | ORAL | Status: AC
  Administered 2020-12-13 (×2): 30 meq via ORAL
  Filled 2020-12-13 (×2): qty 1

## 2020-12-13 MED ORDER — ENSURE ENLIVE PO LIQD
237.0000 mL | Freq: Three times a day (TID) | ORAL | Status: DC
  Administered 2020-12-13 – 2020-12-16 (×5): 237 mL via ORAL

## 2020-12-13 MED ORDER — METOPROLOL TARTRATE 25 MG PO TABS
25.0000 mg | ORAL_TABLET | Freq: Two times a day (BID) | ORAL | Status: DC
  Administered 2020-12-13 – 2020-12-17 (×8): 25 mg via ORAL
  Filled 2020-12-13 (×8): qty 1

## 2020-12-13 MED ORDER — MAGNESIUM SULFATE 2 GM/50ML IV SOLN
2.0000 g | Freq: Once | INTRAVENOUS | Status: AC
  Administered 2020-12-13: 2 g via INTRAVENOUS
  Filled 2020-12-13: qty 50

## 2020-12-13 NOTE — NC FL2 (Signed)
[place] LEVEL OF CARE SCREENING TOOL     IDENTIFICATION  Patient Name: Thomas Matthews Birthdate: [birth year] Sex: male Admission Date (Current Location): 12/11/2020  [facility] and [place] Number:  Engineering geologist and Address:         Provider Number: [id number]  Attending Physician Name and Address:  [place] Eric J, DO  Relative Name and Phone Number:  Earnest Bailey (Daughter)   [phone] [facility])    Current Level of Care: Hospital Recommended Level of Care: [place] Prior Approval Number:    Date Approved/Denied:   PASRR Number: [id number]  Discharge Plan: SNF    Current Diagnoses: Patient Active Problem List   Diagnosis Date Noted   Pressure injury of skin 12/13/2020   Symptomatic anemia 12/11/2020   GI bleeding 12/11/2020   NSVT (nonsustained ventricular tachycardia) 12/11/2020   Head trauma 12/11/2020   History of transcatheter aortic valve replacement (TAVR) 12/11/2020   Acid reflux 07/27/2017   Acne erythematosa 07/27/2017   Flutter-fibrillation 07/27/2017   BP (high blood pressure) 07/27/2017   Cardiac pacemaker in situ 07/27/2017   Coronary atherosclerosis 07/27/2017   DD (diverticular disease) 07/27/2017   History of GI bleed 04/16/2008   Benign neoplasm of colon 02/27/2004    Orientation RESPIRATION BLADDER Height & Weight     Self, Time, Situation, Place  O2 Incontinent, External catheter Weight: 65.2 kg Height:  5\' 7"  (170.2 cm)  BEHAVIORAL SYMPTOMS/MOOD NEUROLOGICAL BOWEL NUTRITION STATUS      Incontinent Diet (Full Liquid with nurtritional supplement at present time)  AMBULATORY STATUS COMMUNICATION OF NEEDS Skin   Limited Assist (Ambulated 5 feel with walker and min assist) Verbally PU Stage and Appropriate Care (Skin Tear Lower Anterior arm, guaze) PU Stage 1 Dressing:  (foam)                     Personal Care Assistance Level of Assistance  Bathing, Feeding, Dressing Bathing Assistance:  Limited assistance Feeding assistance: Limited assistance Dressing Assistance: Limited assistance     Functional Limitations Info  Sight, Hearing, Speech Sight Info: Impaired Hearing Info: Impaired Speech Info: Adequate    SPECIAL CARE FACTORS FREQUENCY  PT (By licensed PT), OT (By licensed OT)     PT Frequency: 5x weekly OT Frequency: 5x weekly            Contractures Contractures Info: Not present    Additional Factors Info  Code Status, Allergies Code Status Info: DNR Allergies Info: Amlodipine Besylate,  Felodipine,   Oxybutynin Chloride,  Simvastatin,  Terazosin           Current Medications (12/13/2020):  This is the current hospital active medication list Current Facility-Administered Medications  Medication Dose Route Frequency Provider Last Rate Last Admin   acetaminophen (TYLENOL) tablet 650 mg  650 mg Oral Q6H PRN Swayze, Ava, DO       Or   acetaminophen (TYLENOL) suppository 650 mg  650 mg Rectal Q6H PRN Swayze, Ava, DO       azithromycin (ZITHROMAX) 500 mg in sodium chloride 0.9 % 250 mL IVPB  500 mg Intravenous Q24H [place] Donnamarie Poag, DO   Stopped at 12/12/20 2135   captopril (CAPOTEN) tablet 12.5 mg  12.5 mg Oral BID [place] Eric J, DO   12.5 mg at 12/13/20 0901   cefTRIAXone (ROCEPHIN) 2 g in sodium chloride 0.9 % 100 mL IVPB  2 g Intravenous Q24H [place] Eric J, DO   Stopped at 12/12/20 2135  folic acid (FOLVITE) tablet 1 mg  1 mg Oral Daily Swayze, Ava, DO   1 mg at 12/13/20 0859   furosemide (LASIX) injection 40 mg  40 mg Intravenous BID [place] Eric J, DO   40 mg at 12/13/20 0315   hydrALAZINE (APRESOLINE) tablet 25 mg  25 mg Oral Q6H PRN [place] Donnamarie Poag, DO       influenza vaccine adjuvanted (FLUAD) injection 0.5 mL  0.5 mL Intramuscular Tomorrow-1000 [place] Eric J, DO       lip balm (BLISTEX) ointment 1 application  1 application Topical PRN Swayze, Ava, DO       metoprolol tartrate (LOPRESSOR) tablet 25 mg  25 mg Oral BID Jettie Booze, PA-C        multivitamin with minerals tablet 1 tablet  1 tablet Oral Daily Swayze, Ava, DO   1 tablet at 12/13/20 0859   ondansetron (ZOFRAN) tablet 4 mg  4 mg Oral Q6H PRN Swayze, Ava, DO       Or   ondansetron (ZOFRAN) injection 4 mg  4 mg Intravenous Q6H PRN Swayze, Ava, DO       pantoprazole (PROTONIX) injection 40 mg  40 mg Intravenous Q12H [place] Eric J, DO   40 mg at 12/13/20 9458   polyvinyl alcohol (LIQUIFILM TEARS) 1.4 % ophthalmic solution 1 drop  1 drop Both Eyes PRN Swayze, Ava, DO       sodium chloride (OCEAN) 0.65 % nasal spray 1 spray  1 spray Each Nare PRN Swayze, Ava, DO       tamsulosin (FLOMAX) capsule 0.4 mg  0.4 mg Oral QPC breakfast [place] Donnamarie Poag, DO   0.4 mg at 12/13/20 5929   thiamine tablet 100 mg  100 mg Oral Daily Swayze, Ava, DO   100 mg at 12/13/20 2446     Discharge Medications: Please see discharge summary for a list of discharge medications.  Relevant Imaging Results:  Relevant Lab Results:   Additional Information SSN#  [id number]  Pete Pelt, RN

## 2020-12-13 NOTE — Progress Notes (Signed)
PROGRESS NOTE    Thomas Matthews  QMV:[id number] DOB: 1923/04/21 DOA: 12/11/2020 PCP: Birdie Sons, MD    Brief Narrative:  Thomas Matthews is a 87 year old male with past medical history significant for essential hypertension, hyperlipidemia, BPH, GERD, iron deficiency anemia, Hx aortic stenosis s/p TAVR, cataract/macular degeneration, paroxysmal atrial fibrillation on Eliquis, CAD, SSS s/p PPM, chronic combined systolic and diastolic CHF (EF 28% TTE 41/32/44), pulmonary HTN who presented to [facility] ED on 10/26 via EMS due to confusion and multiple falls.  On EMS arrival, patient was noted to be in nonsustained ventricular tachycardia and was given 150 mg IV amiodarone and NS bolus in route to the hospital.  Also noted to be in bigeminy with recurrent runs of NSVT.  She reports large bowel movements recently but did not know if it contained any blood.  In the ED, temperature 97.4 F, HR 81, RR 16, BP 175/72, SPO2 95% on 2 L nasal cannula.  WBC 12.4, hemoglobin 7.6, platelets 184.  Sodium 140, potassium 3.4, chloride 110, CO2 24, glucose 161, BUN 36, creatinine 0.78, AST 55, ALT 21, total bilirubin 1.2.  BNP 847.4.  High-sensitivity troponin 119>147.  COVID-19 PCR negative.  Influenza A/B PCR negative.  Chest x-ray with enlargement of cardiac silhouette, noted to have replacement, asymmetric interstitial infiltrates right lung with bibasilar effusions, asymmetric pulmonary edema versus atypical infection.  CT head without contrast with no evidence of acute intracranial roundly, advanced chronic small vessel ischemic changes, generalized cerebral atrophy, biparietal scalp soft tissue swelling/hematoma, no evidence of acute fracture C-spine, multilevel degenerative changes with cervical spondylosis C-spine.   Assessment & Plan:   Active Problems:   Symptomatic anemia   GI bleeding   NSVT (nonsustained ventricular tachycardia)   Head trauma   History of transcatheter aortic valve replacement  (TAVR)   Pressure injury of skin   Acute metabolic encephalopathy, POA Patient presented via EMS after being found by family confused.  Patient is afebrile, with mildly elevated WBC count of 12.4.  Chest x-ray with findings of pulmonary edema versus atypical infection.  CT head with no acute intracranial findings.  Etiology likely multifactorial in the setting of symptomatic anemia, possible atypical pulmonary infection, CHF exacerbation, and NSVT. --Continue treatment as below  Symptomatic anemia Hx IDA Concern for GI bleed Patient presenting with hemoglobin 7.6.  History of iron deficiency anemia on home ferrous sulfate.  Anemia panel with iron 39, TIBC 325 and ferritin 64. S/p 2u pRBC 10/26-27. Hgb 7.6>10.9>11.7>11.9>11.4>10.5 --FOBT pending --Protonix 40 mg IV q12h --IV iron daily x 2 --H&H every 12h; transfuse for hemoglobin less than 8.0 given cardiac issues --If FOBT positive, will consider GI evaluation; but likely poor candidate at this time given his high O2 needs  Nonsustained ventricular tachycardia Upon EMS arrival, patient was noted to be in ventricular tachycardia, received 150 mg IV bolus of amiodarone.  On telemetry patient continues to have intermittent NSVT. --Replacing electrolytes; goal K >4.0 and Mg >2.0 --Transfuse as above --Cardiology consulted for further consideration --Continue monitor on telemetry  Community-acquired pneumonia Chest x-ray with findings of asymmetric interstitial infiltrates right lung --WBC 16.0>11.2 --SLP evaluation pending --Azithromycin 500mg  IV q24h x 5 days --Ceftriaxone 2 g IV q24h x 5 days --CBC daily  Elevated troponin High sensitive troponin elevated on admission, 114>147; etiology likely secondary to demand type II ischemia from NSVT, anemia and CHF exacerbation.  Currently reports chest pain-free. --Continue monitor on telemetry  Acute hypoxic respiratory failure, POA Acute on chronic combined systolic/diastolic  congestive heart failure Patient notably hypoxic via EMS, placed on nasal cannula.  Became more short of breath requiring increased oxygen demand following transfusion IV fluid hydration on admission.  Chest x-ray notable for pulmonary edema versus asymmetric infection with bilateral pleural effusions.  BNP elevated.  Most recent TTE in care everywhere with LVEF 50% on 12/28/2019. --net negative 1.2L past 24h and net negative 679mL since admission --Repeat TTE: Pending --Furosemide 40 mg IV q12h --Continue supplemental oxygen, maintain SPO2 greater than 92%, on 4.5L [place] (not on home O2) --Strict I's and O's and daily weights  Paroxysmal atrial fibrillation --Holding home Eliquis as above due to concern for GI bleed, symptomatic anemia --Continue Cardizem 30 mg p.o. twice daily --Continue monitor on telemetry  Hypokalemia Hypomagnesemia Potassium 3.4 and Magnesium 1.9 this morning, will replete. --Follow electrolytes daily  Essential hypertension --Increase home captopril to 12.5 mg p.o. twice daily --Diltiazem 30 mg p.o. twice daily --Hydralazine 25 mg PO q6h prn SBP >170 or DBP >110  BPH: Tamsulosin 0.4 mg p.o. daily  GERD: On omeprazole at home. --Protonix 40mg  IV q12h as above  Hx aortic stenosis s/p TAVR --Outpatient follow-up with cardiology   DVT prophylaxis: Place and maintain sequential compression device Start: 12/12/20 1410Place and maintain sequential compression device Start: 12/12/20 1410   Code Status: DNR Family Communication: Updated patient's daughter Neoma Laming via telephone this morning  Disposition Plan:  Level of care: Med-Surg Status is: Inpatient  Remains inpatient appropriate because: Continues with confusion, symptomatic anemia, recurrent NSVT and high O2 demand.   Consultants:  Cardiology  Procedures:  None  Antimicrobials:  Azithromycin Ceftriaxone   Subjective: Patient seen examined at bedside, resting comfortably.  RN and nursing  students present.  Less confused this morning.  No specific complaints.  Titrated down to 4.5 L nasal cannula from 10 L high flow nasal cannula this morning.  Appears respiratory effort improved.  No other specific complaints at this time.  Denies headache, no chest pain, no shortness of breath, no cough/congestion, no nausea/vomiting/diarrhea, no chest pain, no palpitations, no abdominal pain.  No acute events overnight per nursing staff.  Objective: Vitals:   12/13/20 0017 12/13/20 0500 12/13/20 0549 12/13/20 1100  BP: 125/67  (!) 171/94 115/63  Pulse: 73  86 86  Resp: 18  16 19   Temp: 97.6 F (36.4 C)  97.8 F (36.6 C)   TempSrc:   Oral   SpO2: 91%  100% 96%  Weight:  65.2 kg    Height:        Intake/Output Summary (Last 24 hours) at 12/13/2020 1157 Last data filed at 12/13/2020 1100 Gross per 24 hour  Intake 100 ml  Output 1350 ml  Net -1250 ml   Filed Weights   12/11/20 1253 12/12/20 2003 12/13/20 0500  Weight: 71.8 kg 66.7 kg 65.2 kg    Examination:  General exam: Appears calm and comfortable, chronically ill in appearance Respiratory system: Decreased breath sounds bilateral bases, mild crackles, normal [place] effort without accessory muscle use, on 4.5 L [place] Cardiovascular system: S1 & S2 heard, RRR. No JVD, murmurs, rubs, gallops or clicks. No pedal edema. Gastrointestinal system: Abdomen is nondistended, soft and nontender. No organomegaly or masses felt. Normal bowel sounds heard. Central nervous system: Alert, oriented to place/time but not person. No focal neurological deficits. Extremities: Symmetric 5 x 5 power.  Moving all extremities independently Skin: No rashes, lesions or ulcers Psychiatry: Judgement and insight appear poor. Mood & affect appropriate.     Data  Reviewed: I have personally reviewed following labs and imaging studies  CBC: Recent Labs  Lab 12/11/20 1302 12/12/20 0018 12/12/20 0641 12/12/20 1451 12/12/20 2248 12/13/20 0024  WBC  12.4*  --  16.0*  --   --  11.2*  NEUTROABS 10.8*  --   --   --   --   --   HGB 7.6* 10.9* 11.7* 11.9* 11.4* 10.5*  HCT 22.6* 31.7* 33.5* 33.4* 32.5* 30.7*  MCV 105.6*  --  95.4  --   --  94.8  PLT 184  --  189  --   --  809*   Basic Metabolic Panel: Recent Labs  Lab 12/11/20 1302 12/12/20 0641 12/12/20 2248 12/13/20 0024  NA 140 140  --  140  K 3.4* 3.0* 3.3* 3.4*  CL 110 103  --  103  CO2 24 25  --  30  GLUCOSE 161* 113*  --  107*  BUN 36* 28*  --  25*  CREATININE 0.78 0.86  --  0.84  CALCIUM 8.3* 8.5*  --  8.0*  MG  --  1.9 2.0 1.9   GFR: Estimated Creatinine Clearance: 48.5 mL/min (by C-G formula based on SCr of 0.84 mg/dL). Liver Function Tests: Recent Labs  Lab 12/11/20 1302 12/12/20 0641 12/13/20 0024  AST 55* 61* 43*  ALT 21 26 22   ALKPHOS 59 64 48  BILITOT 1.2 2.0* 1.3*  PROT 6.1* 6.4* 5.6*  ALBUMIN 3.4* 3.3* 2.7*   No results for input(s): LIPASE, AMYLASE in the last 168 hours. No results for input(s): AMMONIA in the last 168 hours. Coagulation Profile: Recent Labs  Lab 12/12/20 0641  INR 1.3*   Cardiac Enzymes: No results for input(s): CKTOTAL, CKMB, CKMBINDEX, TROPONINI in the last 168 hours. BNP (last 3 results) No results for input(s): PROBNP in the last 8760 hours. HbA1C: No results for input(s): HGBA1C in the last 72 hours. CBG: No results for input(s): GLUCAP in the last 168 hours. Lipid Profile: No results for input(s): CHOL, HDL, LDLCALC, TRIG, CHOLHDL, LDLDIRECT in the last 72 hours. Thyroid Function Tests: No results for input(s): TSH, T4TOTAL, FREET4, T3FREE, THYROIDAB in the last 72 hours. Anemia Panel: Recent Labs    12/11/20 1302 12/12/20 0641  VITAMINB12  --  182  FOLATE  --  15.4  FERRITIN 64 89  TIBC 325 319  IRON 39* 81  RETICCTPCT  --  4.2*   Sepsis Labs: Recent Labs  Lab 12/11/20 1539  PROCALCITON <0.10    Recent Results (from the past 240 hour(s))  Resp Panel by RT-PCR (Flu A&B, Covid) Nasopharyngeal Swab      Status: None   Collection Time: 12/12/20 12:46 AM   Specimen: Nasopharyngeal Swab; Nasopharyngeal(NP) swabs in vial transport medium  Result Value Ref Range Status   SARS Coronavirus 2 by RT PCR NEGATIVE NEGATIVE Final    Comment: (NOTE) SARS-CoV-2 target nucleic acids are NOT DETECTED.  The SARS-CoV-2 RNA is generally detectable in upper respiratory specimens during the acute phase of infection. The lowest concentration of SARS-CoV-2 viral copies this assay can detect is 138 copies/mL. A negative result does not preclude SARS-Cov-2 infection and should not be used as the sole basis for treatment or other patient management decisions. A negative result may occur with  improper specimen collection/handling, submission of specimen other than nasopharyngeal swab, presence of viral mutation(s) within the areas targeted by this assay, and inadequate number of viral copies(<138 copies/mL). A negative result must be combined with clinical observations,  patient history, and epidemiological information. The expected result is Negative.  Fact Sheet for Patients:  EntrepreneurPulse.com.au  Fact Sheet for Healthcare Providers:  IncredibleEmployment.be  This test is no t yet approved or cleared by the [place] FDA and  has been authorized for detection and/or diagnosis of SARS-CoV-2 by FDA under an Emergency Use Authorization (EUA). This EUA will remain  in effect (meaning this test can be used) for the duration of the COVID-19 declaration under Section 564(b)(1) of the Act, 21 U.S.C.section 360bbb-3(b)(1), unless the authorization is terminated  or revoked sooner.       Influenza A by PCR NEGATIVE NEGATIVE Final   Influenza B by PCR NEGATIVE NEGATIVE Final    Comment: (NOTE) The Xpert Xpress SARS-CoV-2/FLU/RSV plus assay is intended as an aid in the diagnosis of influenza from Nasopharyngeal swab specimens and should not be used as a sole basis  for treatment. Nasal washings and aspirates are unacceptable for Xpert Xpress SARS-CoV-2/FLU/RSV testing.  Fact Sheet for Patients: EntrepreneurPulse.com.au  Fact Sheet for Healthcare Providers: IncredibleEmployment.be  This test is not yet approved or cleared by the [place] FDA and has been authorized for detection and/or diagnosis of SARS-CoV-2 by FDA under an Emergency Use Authorization (EUA). This EUA will remain in effect (meaning this test can be used) for the duration of the COVID-19 declaration under Section 564(b)(1) of the Act, 21 U.S.C. section 360bbb-3(b)(1), unless the authorization is terminated or revoked.  Performed at [facility], [address], [place], [place] [address]          Radiology Studies: CT Head Wo Contrast  Result Date: 12/11/2020 CLINICAL DATA:  Head trauma, minor.  Neck trauma.  Fall. EXAM: CT HEAD WITHOUT CONTRAST CT CERVICAL SPINE WITHOUT CONTRAST TECHNIQUE: Multidetector CT imaging of the head and cervical spine was performed following the standard protocol without intravenous contrast. Multiplanar CT image reconstructions of the cervical spine were also generated. COMPARISON:  None. FINDINGS: CT HEAD FINDINGS Brain: Mild for age generalized cerebral atrophy. Advanced patchy and confluent hypoattenuation within the cerebral white matter, nonspecific but compatible chronic small vessel ischemic disease. There is no acute intracranial hemorrhage. No demarcated cortical infarct. No extra-axial fluid collection. No evidence of an intracranial mass. No midline shift. Vascular: No hyperdense vessel.  Atherosclerotic calcifications. Skull: Normal. Negative for fracture or focal lesion. Sinuses/Orbits: Visualized orbits show no acute finding. Minimal mucosal thickening within the bilateral ethmoid, left sphenoid and left maxillary sinuses. Other: Trace fluid within the right mastoid air cells. Biparietal  scalp soft tissue swelling/hematoma. CT CERVICAL SPINE FINDINGS Alignment: No significant spondylolisthesis. Skull base and vertebrae: The basion-dental and atlanto-dental intervals are maintained.No evidence of acute fracture to the cervical spine. Soft tissues and spinal canal: No prevertebral fluid or swelling. No visible canal hematoma. Disc levels: At C2-C3, there is early osseous fusion across the disc space and facet joint ankylosis on the right. Degenerative appearing fusion across the C5-C6 disc space. Additional cervical spondylosis with multilevel disc space narrowing, disc bulges/central disc protrusions, posterior disc osteophytes, endplate spurring, uncovertebral hypertrophy and facet arthrosis. Disc space narrowing is advanced at C3-C4, C4-C5 and C6-C7. No appreciable severe spinal canal stenosis. Multilevel bony neural foraminal narrowing. Upper chest: No consolidation within the imaged lung apices. No visible pneumothorax. Prominent biapical emphysema and bullae/blebs. IMPRESSION: CT head: 1. No evidence of acute intracranial abnormality. 2. Advanced chronic small vessel ischemic changes within the cerebral white matter. 3. Mild for age generalized cerebral atrophy. 4. Biparietal scalp soft tissue swelling/hematoma.  5. Mild paranasal sinus mucosal thickening. 6. Trace fluid within the right mastoid air cells. CT cervical spine: 1. No evidence of acute fracture to the cervical spine. 2. Cervical spondylosis and multilevel degenerative appearing ankylosis, as described. 3.  Emphysema (ICD10-J43.9). Electronically Signed   By: Kellie Simmering D.O.   On: 12/11/2020 16:02   CT Cervical Spine Wo Contrast  Result Date: 12/11/2020 CLINICAL DATA:  Head trauma, minor.  Neck trauma.  Fall. EXAM: CT HEAD WITHOUT CONTRAST CT CERVICAL SPINE WITHOUT CONTRAST TECHNIQUE: Multidetector CT imaging of the head and cervical spine was performed following the standard protocol without intravenous contrast. Multiplanar  CT image reconstructions of the cervical spine were also generated. COMPARISON:  None. FINDINGS: CT HEAD FINDINGS Brain: Mild for age generalized cerebral atrophy. Advanced patchy and confluent hypoattenuation within the cerebral white matter, nonspecific but compatible chronic small vessel ischemic disease. There is no acute intracranial hemorrhage. No demarcated cortical infarct. No extra-axial fluid collection. No evidence of an intracranial mass. No midline shift. Vascular: No hyperdense vessel.  Atherosclerotic calcifications. Skull: Normal. Negative for fracture or focal lesion. Sinuses/Orbits: Visualized orbits show no acute finding. Minimal mucosal thickening within the bilateral ethmoid, left sphenoid and left maxillary sinuses. Other: Trace fluid within the right mastoid air cells. Biparietal scalp soft tissue swelling/hematoma. CT CERVICAL SPINE FINDINGS Alignment: No significant spondylolisthesis. Skull base and vertebrae: The basion-dental and atlanto-dental intervals are maintained.No evidence of acute fracture to the cervical spine. Soft tissues and spinal canal: No prevertebral fluid or swelling. No visible canal hematoma. Disc levels: At C2-C3, there is early osseous fusion across the disc space and facet joint ankylosis on the right. Degenerative appearing fusion across the C5-C6 disc space. Additional cervical spondylosis with multilevel disc space narrowing, disc bulges/central disc protrusions, posterior disc osteophytes, endplate spurring, uncovertebral hypertrophy and facet arthrosis. Disc space narrowing is advanced at C3-C4, C4-C5 and C6-C7. No appreciable severe spinal canal stenosis. Multilevel bony neural foraminal narrowing. Upper chest: No consolidation within the imaged lung apices. No visible pneumothorax. Prominent biapical emphysema and bullae/blebs. IMPRESSION: CT head: 1. No evidence of acute intracranial abnormality. 2. Advanced chronic small vessel ischemic changes within the  cerebral white matter. 3. Mild for age generalized cerebral atrophy. 4. Biparietal scalp soft tissue swelling/hematoma. 5. Mild paranasal sinus mucosal thickening. 6. Trace fluid within the right mastoid air cells. CT cervical spine: 1. No evidence of acute fracture to the cervical spine. 2. Cervical spondylosis and multilevel degenerative appearing ankylosis, as described. 3.  Emphysema (ICD10-J43.9). Electronically Signed   By: Kellie Simmering D.O.   On: 12/11/2020 16:02   DG Chest Portable 1 View  Result Date: 12/11/2020 CLINICAL DATA:  Brief episodes of ventricular tachycardia, slightly altered Mental status, history hypertension, coronary artery disease EXAM: PORTABLE CHEST 1 VIEW COMPARISON:  Portable exam 1319 hours compared to 06/10/2011 FINDINGS: LEFT subclavian sequential transvenous pacemaker leads project at RIGHT atrium and RIGHT ventricle. Enlargement of cardiac silhouette post AVR. Mediastinal contours and pulmonary vascularity normal with atherosclerotic calcification of the aortic arch. Infiltrates identified throughout RIGHT lung, question asymmetric edema versus infection. Skin folds project over the lower RIGHT chest. Tiny bibasilar pleural effusions. No pneumothorax Bones demineralized. IMPRESSION: Enlargement of cardiac silhouette post TAVR pacemaker. Asymmetric interstitial infiltrates throughout RIGHT lung with tiny bibasilar effusions, Western asymmetric pulmonary edema versus atypical infection. Electronically Signed   By: Lavonia Dana M.D.   On: 12/11/2020 14:04        Scheduled Meds:  captopril  12.5 mg Oral BID  diltiazem  30 mg Oral BID   folic acid  1 mg Oral Daily   furosemide  40 mg Intravenous BID   influenza vaccine adjuvanted  0.5 mL Intramuscular Tomorrow-1000   multivitamin with minerals  1 tablet Oral Daily   pantoprazole (PROTONIX) IV  40 mg Intravenous Q12H   potassium chloride  30 mEq Oral Q3H   tamsulosin  0.4 mg Oral QPC breakfast   thiamine  100 mg Oral  Daily   Continuous Infusions:  azithromycin Stopped (12/12/20 2135)   cefTRIAXone (ROCEPHIN)  IV Stopped (12/12/20 2135)     LOS: 2 days    Time spent: 38 minutes spent on chart review, discussion with nursing staff, consultants, updating family and interview/physical exam; more than 50% of that time was spent in counseling and/or coordination of care.    Rolena Knutson J [place] DO [facility] Available via Epic secure chat 7am-7pm After these hours, please refer to coverage provider listed on [email or web address] 12/13/2020, 11:57 AM

## 2020-12-13 NOTE — TOC Initial Note (Signed)
Transition of Care [facility]) - Initial/Assessment Note    Patient Details  Name: Thomas Matthews MRN: [id number] Date of Birth: 1924-01-05  Transition of [facility]) CM/SW Contact:    Pete Pelt, RN Phone Number: 12/13/2020, 2:02 PM  Clinical Narrative:   Patient lives at home alone since his wife passed away several months ago.  Daughter at bedside, states that both children live >20 mins away and are unable to provide constant supervision for patient.  Patient agreed to go to SNF to get stronger to go home.  Bed search started in [place] area.  TOC contact information provided, TOC to follow to discharge.                Expected Discharge Plan: Skilled Nursing Facility Barriers to Discharge: Continued Medical Work up   Patient Goals and CMS Choice Patient states their goals for this hospitalization and ongoing recovery are:: To get strong enough to return home      Expected Discharge Plan and Services Expected Discharge Plan: [place]   Discharge Planning Services: CM Consult Post Acute Care Choice: [facility] Living arrangements for the past 2 months: Single Family Home                                      Prior Living Arrangements/Services Living arrangements for the past 2 months: Single Family Home Lives with:: Self Patient language and need for interpreter reviewed:: Yes (Hard of hearing, but no interpreter required) Do you feel safe going back to the place where you live?: Yes (Patient feels safe after SNF stay)      Need for Family Participation in Patient Care: Yes (Comment) Care giver support system in place?: Yes (comment) (Limited as patient living alone.)   Criminal Activity/Legal Involvement Pertinent to Current Situation/Hospitalization: No - Comment as needed  Activities of Daily Living Home Assistive Devices/Equipment: Grab bars in shower ADL Screening (condition at time of admission) Patient's cognitive  ability adequate to safely complete daily activities?: Yes Is the patient deaf or have difficulty hearing?: Yes Does the patient have difficulty seeing, even when wearing glasses/contacts?: No Does the patient have difficulty concentrating, remembering, or making decisions?: Yes Patient able to express need for assistance with ADLs?: Yes Does the patient have difficulty dressing or bathing?: Yes Independently performs ADLs?: No Communication: Independent Dressing (OT): Needs assistance Is this a change from baseline?: Pre-admission baseline Grooming: Needs assistance Is this a change from baseline?: Pre-admission baseline Feeding: Needs assistance Is this a change from baseline?: Pre-admission baseline Bathing: Needs assistance Is this a change from baseline?: Change from baseline, expected to last <3 days Toileting: Needs assistance Is this a change from baseline?: Change from baseline, expected to last <3 days In/Out Bed: Needs assistance Is this a change from baseline?: Change from baseline, expected to last <3 days Walks in Home: Needs assistance Is this a change from baseline?: Change from baseline, expected to last <3 days Does the patient have difficulty walking or climbing stairs?: Yes Weakness of Legs: Both Weakness of Arms/Hands: Both  Permission Sought/Granted Permission sought to share information with : Case Manager, Customer service manager Permission granted to share information with : Yes, Verbal Permission Granted     Permission granted to share info w AGENCY: Prospective SNF        Emotional Assessment Appearance:: Appears stated age Attitude/Demeanor/Rapport: Engaged, Gracious Affect (typically observed): Calm,  Pleasant, Appropriate Orientation: : Oriented to Self, Oriented to Place, Oriented to Situation Alcohol / Substance Use: Not Applicable Psych Involvement: No (comment)  Admission diagnosis:  Melena [K92.1] Elevated troponin  [R77.8] Symptomatic anemia [D64.9] Anemia, unspecified type [D64.9] Community acquired pneumonia, unspecified laterality [I75.7] Systolic congestive heart failure, unspecified HF chronicity (HCC) [I50.20] Patient Active Problem List   Diagnosis Date Noted   Pressure injury of skin 12/13/2020   Symptomatic anemia 12/11/2020   GI bleeding 12/11/2020   NSVT (nonsustained ventricular tachycardia) 12/11/2020   Head trauma 12/11/2020   History of transcatheter aortic valve replacement (TAVR) 12/11/2020   Acid reflux 07/27/2017   Acne erythematosa 07/27/2017   Flutter-fibrillation 07/27/2017   BP (high blood pressure) 07/27/2017   Cardiac pacemaker in situ 07/27/2017   Coronary atherosclerosis 07/27/2017   DD (diverticular disease) 07/27/2017   History of GI bleed 04/16/2008   Benign neoplasm of colon 02/27/2004   PCP:  Birdie Sons, MD Pharmacy:   [facility] [place] [address] [address] Phone: [phone] Fax: [place], [place] [address] [place] [address] Phone: [phone] Fax: [phone]     Social Determinants of Health (SDOH) Interventions    Readmission Risk Interventions No flowsheet data found.

## 2020-12-13 NOTE — Evaluation (Signed)
Clinical/Bedside Swallow Evaluation Patient Details  Name: Thomas Matthews MRN: [id number] Date of Birth: [birth year]  Today's Date: 12/13/2020 Time: SLP Start Time (ACUTE ONLY): 1105 SLP Stop Time (ACUTE ONLY): 1122 SLP Time Calculation (min) (ACUTE ONLY): 17 min  Past Medical History: History reviewed. No pertinent past medical history. Past Surgical History: History reviewed. No pertinent surgical history. HPI:  Per 54 H&P, 12/11/20, "The patient is a 87 yr old man who was brought to the ED by his son for the above chief complaints.      In the ED the patient was found to have a hemoglobin of 7.6. he also had 2 runs of NSVT for which he was started on amiodarone. He was also found to have maroon stools.  His potassium is low and has been supplemented. Magnesium is pending. The patient is being transfused with 1 unit PRBC's x 1.      [facility] hospitalists have been consulted to admit the patient for further evaluation and treatment.      He will be admitted to a telemetry bed. Contact and airborne precautions will be continued until his COVID test determines his COVID status. He will be continued on amiodarone. Eliquis will be held. His H&H will be followed carefully. PT/OT will evaluate the patient for appropriate disposition when appropriate.      He denies fevers, chills, cough, shortness of breath. No nausea, vomiting, constipation or diarrhea. The maroon stools were noted by nursing in the ED. The patient states that he hasn't noted any change in his stools.      The patient will be admitted to a telemetry bed. He is receiving one unit of PRBC's now. His hemoglobin will be followed. Cardiology should be consulted in the am regarding the need for eliquis at this point more than 3 years after his TAVR."   CXR 12/11/20 "Enlargement of cardiac silhouette post TAVR pacemaker.   Asymmetric interstitial infiltrates throughout RIGHT lung with tiny bibasilar effusions, Western asymmetric  pulmonary edema versus atypical infection."   Assessment / Plan / Recommendation  Clinical Impression  Pt seen for clinical swallowing evaluation. Evaluation limited to clear liquids (apple juice, jello) given clear liquid restrictions in setting of GIB. For consistencies given, pt demonstrated an intact oral swallow. Pt with x1 delayed cough with straw sip of thin liquids, ?relationship to PO as pt consumed ~4oz of juice. To palpation, pt with seemingly adequate laryngeal elevation and timely swallow initiation. No change to vocal quality across trials.  Per chart review, temp WNL. WBC trending downward. Most recent CXR completed 10/25 "... Asymmetric interstitial infiltrates throughout RIGHT lung with tiny bibasilar effusions, Western asymmetric pulmonary edema versus atypical infection."   Recommend continuation of current diet per MD guidance. Standard aspiration precautions given increased respiratory demands (4.5L/min O2 via [place]). Medication as tolerated.   SLP to f/u per POC for diet tolerance/clinical swallowing re-evaluation, as appropriate, and to determine need for instrumental swallowing evaluation in setting of CAP/acute hypoxic resp failure.  SLP Visit Diagnosis: Dysphagia, pharyngeal phase (R13.13)    Aspiration Risk  Mild aspiration risk    Diet Recommendation  (continue current diet (clear liquids) per MD guidance)   Medication Administration:  (as tolerated) Supervision: Patient able to self feed Compensations: Slow rate;Small sips/bites Postural Changes: Seated upright at 90 degrees    Other  Recommendations Oral Care Recommendations: Oral care BID    Recommendations for follow up therapy are one component of a multi-disciplinary discharge planning process, led by the attending  physician.  Recommendations may be updated based on patient status, additional functional criteria and insurance authorization.  Follow up Recommendations  (pending further evaluation)       Frequency and Duration min 2x/week  1 week       Prognosis Prognosis for Safe Diet Advancement: Good Barriers to Reach Goals:  (GIV)      Swallow Study   General Date of Onset: 12/11/20 HPI: Per Physician's H&P, 12/11/20, "The patient is a 87 yr old man who was brought to the ED by his son for the above chief complaints.      In the ED the patient was found to have a hemoglobin of 7.6. he also had 2 runs of NSVT for which he was started on amiodarone. He was also found to have maroon stools.  His potassium is low and has been supplemented. Magnesium is pending. The patient is being transfused with 1 unit PRBC's x 1.      [facility] hospitalists have been consulted to admit the patient for further evaluation and treatment.      He will be admitted to a telemetry bed. Contact and airborne precautions will be continued until his COVID test determines his COVID status. He will be continued on amiodarone. Eliquis will be held. His H&H will be followed carefully. PT/OT will evaluate the patient for appropriate disposition when appropriate.      He denies fevers, chills, cough, shortness of breath. No nausea, vomiting, constipation or diarrhea. The maroon stools were noted by nursing in the ED. The patient states that he hasn't noted any change in his stools.      The patient will be admitted to a telemetry bed. He is receiving one unit of PRBC's now. His hemoglobin will be followed. Cardiology should be consulted in the am regarding the need for eliquis at this point more than 3 years after his TAVR." Type of Study: Bedside Swallow Evaluation Previous Swallow Assessment: unknown Diet Prior to this Study:  (clear liquids) Respiratory Status: Nasal cannula (4L/min) History of Recent Intubation: No Behavior/Cognition: Alert;Cooperative;Pleasant mood Oral Cavity Assessment: Within Functional Limits Oral Care Completed by SLP: Yes Oral Cavity - Dentition: Dentures, top;Dentures, bottom Vision: Functional for  self-feeding Self-Feeding Abilities: Able to feed self Patient Positioning: Upright in bed Baseline Vocal Quality: Normal Volitional Cough: Strong Volitional Swallow: Able to elicit    Oral/Motor/Sensory Function Overall Oral Motor/Sensory Function: Within functional limits   Thin Liquid Thin Liquid: Within functional limits Presentation: Self Fed;Cup;Straw Other Comments: ~4 oz apple juice    Puree Puree: Within functional limits Presentation: Self Fed Other Comments: ~4 oz jello   Solid     Solid: Not tested Other Comments: deferred due to clear restrictions in setting of GIB     Thomas Matthews, M.S., Thomas Matthews   Thomas Matthews 12/13/2020,11:53 AM

## 2020-12-13 NOTE — Progress Notes (Signed)
[facility] Cardiology [facility] Encounter Note  Patient: Thomas Matthews / Admit Date: 12/11/2020 / Date of Encounter: 12/13/2020, 3:01 PM   Subjective: 87 y.o. 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 Pt presented to the ED yesterday with concerns for family reports of "not acting right" and two falls. Patent was found to be in vtach by EMS and given amiodarone and normal saline for management with reports of continued NSVT by ED. Pt was noted to have blood in his stool at the ED with a decreased hemoglobin of 7.6 treated with transfusion of 1 unit PRBCs.    10/27 Patient has had improvement in Hgb 11.7 after PRBC transfusion and states that he feels well right now. He denies any issues with chest pain, sob, palpitations, weakness, fatigue. He denies noticing any blood in his stool at home.   Today patient states that he feels well with no complaints of chest pain, shortness of breath, lower extremity edema, fatigue, weakness.  Patient's hemoglobin has declined again to 10.5.  Currently awaiting fecal occult blood testing and possible GI consult.  Review of Systems: Positive DQQ:IWLN Negative for: Vision change, hearing change, syncope, dizziness, nausea, vomiting,diarrhea, bloody stool, stomach pain, cough, congestion, diaphoresis, urinary frequency, urinary pain,skin lesions, skin rashes Others previously listed  Objective: Telemetry: atrial fibrillation with nonspecific conduction delay and very frequent PVCs. There have seen some episodes of nonsustained wide complex tachycardia  Physical Exam: Blood pressure 115/63, pulse 86, temperature 97.8 F (36.6 C), temperature source Oral, resp. rate 19, height 5\' 7"  (1.702 m), weight 65.2 kg, SpO2 96 %. Body mass index is 22.51 kg/m. General: Elderly and fail  appearing, in no acute distress. Head: Normocephalic, atraumatic, sclera non-icteric, no xanthomas, nares are without discharge. Neck: No apparent masses Lungs: Normal respirations with no wheezes, no rhonchi, no rales , no crackles   Heart: irregular rate and rhythm, normal S1 S2, no murmur, no rub, no gallop, PMI is normal size and placement, carotid upstroke normal without bruit, jugular venous pressure normal Abdomen: Soft, non-tender, non-distended with normoactive bowel sounds. No hepatosplenomegaly. Abdominal aorta is normal size without bruit Extremities: No edema, no clubbing, no cyanosis, no ulcers,  Peripheral: 2+ radial, 2+ femoral, 2+ dorsal pedal pulses Neuro: Alert and oriented. Moves all extremities spontaneously. Psych:  Responds to questions appropriately with a normal affect.   Intake/Output Summary (Last 24 hours) at 12/13/2020 1501 Last data filed at 12/13/2020 1100 Gross per 24 hour  Intake --  Output 1350 ml  Net -1350 ml    Inpatient Medications:   captopril  12.5 mg Oral BID   diltiazem  30 mg Oral BID   folic acid  1 mg Oral Daily   furosemide  40 mg Intravenous BID   influenza vaccine adjuvanted  0.5 mL Intramuscular Tomorrow-1000   multivitamin with minerals  1 tablet Oral Daily   pantoprazole (PROTONIX) IV  40 mg Intravenous Q12H   tamsulosin  0.4 mg Oral QPC breakfast   thiamine  100 mg Oral Daily   Infusions:   azithromycin Stopped (12/12/20 2135)   cefTRIAXone (ROCEPHIN)  IV Stopped (12/12/20 2135)    Labs: Recent Labs    12/12/20 0641 12/12/20 2248 12/13/20 0024  NA 140  --  140  K 3.0* 3.3* 3.4*  CL 103  --  103  CO2 25  --  30  GLUCOSE 113*  --  107*  BUN 28*  --  25*  CREATININE 0.86  --  0.84  CALCIUM 8.5*  --  8.0*  MG 1.9 2.0 1.9   Recent Labs    12/12/20 0641 12/13/20 0024  AST 61* 43*  ALT 26 22  ALKPHOS 64 48  BILITOT 2.0* 1.3*  PROT 6.4* 5.6*  ALBUMIN 3.3* 2.7*   Recent Labs    12/11/20 1302 12/12/20 0018  12/12/20 0641 12/12/20 1451 12/12/20 2248 12/13/20 0024  WBC 12.4*  --  16.0*  --   --  11.2*  NEUTROABS 10.8*  --   --   --   --   --   HGB 7.6*   < > 11.7*   < > 11.4* 10.5*  HCT 22.6*   < > 33.5*   < > 32.5* 30.7*  MCV 105.6*  --  95.4  --   --  94.8  PLT 184  --  189  --   --  145*   < > = values in this interval not displayed.   No results for input(s): CKTOTAL, CKMB, TROPONINI in the last 72 hours. Invalid input(s): POCBNP No results for input(s): HGBA1C in the last 72 hours.   Weights: Filed Weights   12/11/20 1253 12/12/20 2003 12/13/20 0500  Weight: 71.8 kg 66.7 kg 65.2 kg     Radiology/Studies:  CT Head Wo Contrast  Result Date: 12/11/2020 CLINICAL DATA:  Head trauma, minor.  Neck trauma.  Fall. EXAM: CT HEAD WITHOUT CONTRAST CT CERVICAL SPINE WITHOUT CONTRAST TECHNIQUE: Multidetector CT imaging of the head and cervical spine was performed following the standard protocol without intravenous contrast. Multiplanar CT image reconstructions of the cervical spine were also generated. COMPARISON:  None. FINDINGS: CT HEAD FINDINGS Brain: Mild for age generalized cerebral atrophy. Advanced patchy and confluent hypoattenuation within the cerebral white matter, nonspecific but compatible chronic small vessel ischemic disease. There is no acute intracranial hemorrhage. No demarcated cortical infarct. No extra-axial fluid collection. No evidence of an intracranial mass. No midline shift. Vascular: No hyperdense vessel.  Atherosclerotic calcifications. Skull: Normal. Negative for fracture or focal lesion. Sinuses/Orbits: Visualized orbits show no acute finding. Minimal mucosal thickening within the bilateral ethmoid, left sphenoid and left maxillary sinuses. Other: Trace fluid within the right mastoid air cells. Biparietal scalp soft tissue swelling/hematoma. CT CERVICAL SPINE FINDINGS Alignment: No significant spondylolisthesis. Skull base and vertebrae: The basion-dental and atlanto-dental  intervals are maintained.No evidence of acute fracture to the cervical spine. Soft tissues and spinal canal: No prevertebral fluid or swelling. No visible canal hematoma. Disc levels: At C2-C3, there is early osseous fusion across the disc space and facet joint ankylosis on the right. Degenerative appearing fusion across the C5-C6 disc space. Additional cervical spondylosis with multilevel disc space narrowing, disc bulges/central disc protrusions, posterior disc osteophytes, endplate spurring, uncovertebral hypertrophy and facet arthrosis. Disc space narrowing is advanced at C3-C4, C4-C5 and C6-C7. No appreciable severe spinal canal stenosis. Multilevel bony neural foraminal narrowing. Upper chest: No consolidation within the imaged lung apices. No visible pneumothorax. Prominent biapical emphysema and bullae/blebs. IMPRESSION: CT head: 1. No evidence of acute intracranial abnormality. 2. Advanced chronic small vessel ischemic changes within the cerebral white matter. 3. Mild for age generalized cerebral atrophy. 4. Biparietal scalp soft tissue swelling/hematoma. 5. Mild paranasal sinus mucosal thickening. 6. Trace fluid within the right mastoid air cells. CT cervical spine: 1. No evidence of acute  fracture to the cervical spine. 2. Cervical spondylosis and multilevel degenerative appearing ankylosis, as described. 3.  Emphysema (ICD10-J43.9). Electronically Signed   By: Kellie Simmering D.O.   On: 12/11/2020 16:02   CT Cervical Spine Wo Contrast  Result Date: 12/11/2020 CLINICAL DATA:  Head trauma, minor.  Neck trauma.  Fall. EXAM: CT HEAD WITHOUT CONTRAST CT CERVICAL SPINE WITHOUT CONTRAST TECHNIQUE: Multidetector CT imaging of the head and cervical spine was performed following the standard protocol without intravenous contrast. Multiplanar CT image reconstructions of the cervical spine were also generated. COMPARISON:  None. FINDINGS: CT HEAD FINDINGS Brain: Mild for age generalized cerebral atrophy. Advanced  patchy and confluent hypoattenuation within the cerebral white matter, nonspecific but compatible chronic small vessel ischemic disease. There is no acute intracranial hemorrhage. No demarcated cortical infarct. No extra-axial fluid collection. No evidence of an intracranial mass. No midline shift. Vascular: No hyperdense vessel.  Atherosclerotic calcifications. Skull: Normal. Negative for fracture or focal lesion. Sinuses/Orbits: Visualized orbits show no acute finding. Minimal mucosal thickening within the bilateral ethmoid, left sphenoid and left maxillary sinuses. Other: Trace fluid within the right mastoid air cells. Biparietal scalp soft tissue swelling/hematoma. CT CERVICAL SPINE FINDINGS Alignment: No significant spondylolisthesis. Skull base and vertebrae: The basion-dental and atlanto-dental intervals are maintained.No evidence of acute fracture to the cervical spine. Soft tissues and spinal canal: No prevertebral fluid or swelling. No visible canal hematoma. Disc levels: At C2-C3, there is early osseous fusion across the disc space and facet joint ankylosis on the right. Degenerative appearing fusion across the C5-C6 disc space. Additional cervical spondylosis with multilevel disc space narrowing, disc bulges/central disc protrusions, posterior disc osteophytes, endplate spurring, uncovertebral hypertrophy and facet arthrosis. Disc space narrowing is advanced at C3-C4, C4-C5 and C6-C7. No appreciable severe spinal canal stenosis. Multilevel bony neural foraminal narrowing. Upper chest: No consolidation within the imaged lung apices. No visible pneumothorax. Prominent biapical emphysema and bullae/blebs. IMPRESSION: CT head: 1. No evidence of acute intracranial abnormality. 2. Advanced chronic small vessel ischemic changes within the cerebral white matter. 3. Mild for age generalized cerebral atrophy. 4. Biparietal scalp soft tissue swelling/hematoma. 5. Mild paranasal sinus mucosal thickening. 6. Trace  fluid within the right mastoid air cells. CT cervical spine: 1. No evidence of acute fracture to the cervical spine. 2. Cervical spondylosis and multilevel degenerative appearing ankylosis, as described. 3.  Emphysema (ICD10-J43.9). Electronically Signed   By: Kellie Simmering D.O.   On: 12/11/2020 16:02   DG Chest Portable 1 View  Result Date: 12/11/2020 CLINICAL DATA:  Brief episodes of ventricular tachycardia, slightly altered Mental status, history hypertension, coronary artery disease EXAM: PORTABLE CHEST 1 VIEW COMPARISON:  Portable exam 1319 hours compared to 06/10/2011 FINDINGS: LEFT subclavian sequential transvenous pacemaker leads project at RIGHT atrium and RIGHT ventricle. Enlargement of cardiac silhouette post AVR. Mediastinal contours and pulmonary vascularity normal with atherosclerotic calcification of the aortic arch. Infiltrates identified throughout RIGHT lung, question asymmetric edema versus infection. Skin folds project over the lower RIGHT chest. Tiny bibasilar pleural effusions. No pneumothorax Bones demineralized. IMPRESSION: Enlargement of cardiac silhouette post TAVR pacemaker. Asymmetric interstitial infiltrates throughout RIGHT lung with tiny bibasilar effusions, Western asymmetric pulmonary edema versus atypical infection. Electronically Signed   By: Lavonia Dana M.D.   On: 12/11/2020 14:04     Assessment and Recommendation  87 y.o. male with PMH of severe AS s/p TAVR 2019, chronic combined systolic and diastolic HF EF 14%, chronic a-fib on eliquis, CAD with known LAD 80%, SSS s/p dual  chamber pacer 2006 with battery change 2014, HTN, HLD, pulmonary HTN with concerns for several runs of wide-complex tachycardia as well as possible GI bleed on Eliquis.  Plan: -Continue to hold Eliquis at this time pending fecal occult blood test and possible GI evaluation for possible GI bleed -Change diltiazem to metoprolol for better heart rate control with atrial fibrillation and episodic  wide-complex tachycardia -Continue current management of symptomatic anemia and GI bleed -no further cardiac diagnostic testing necessary at this time   Signed, Jettie Booze, PA-C

## 2020-12-13 NOTE — Care Management Important Message (Signed)
Important Message  Patient Details  Name: CHARLTON BOULE MRN: [id number] Date of Birth: 08/27/1923   Medicare Important Message Given:  N/A - LOS <3 / Initial given by admissions     Juliann Pulse A Temperence Zenor 12/13/2020, 8:44 AM

## 2020-12-13 NOTE — Consult Note (Signed)
WOC Nurse Consult Note: Reason for Consult:Bilateral forearm skin tears Wound type:trauma Pressure Injury POA: N/A Measurement:To be obtained by Bedside RN prior to first dressing application and documented on Nursing Flow Sheet Wound bed:red, moist Drainage (amount, consistency, odor) serosanguinous to serous Periwound: intact with bruising, ecchymosis Dressing procedure/placement/frequency: I have discussed the POC recommendations with the Bedside RN, Sarah and we together decide to implement the Skin Care order Set for Skin Tears using the folded impregnated antimicrobial gauze for topical care-not only for antimicrobial properties, but also for atraumatic dressing changes. Guidance provided via the Orders.  [facility] nursing team will not follow, but will remain available to this patient, the nursing and medical teams.  Please re-consult if needed. Thanks, Maudie Flakes, MSN, RN, [place], Arther Abbott  Pager# [phone]

## 2020-12-13 NOTE — Progress Notes (Signed)
*  PRELIMINARY RESULTS* Echocardiogram 2D Echocardiogram has been performed.  Sherrie Sport 12/13/2020, 12:53 PM

## 2020-12-13 NOTE — Progress Notes (Signed)
Occupational Therapy Treatment Patient Details Name: Thomas Matthews MRN: [id number] DOB: [birth year] Today's Date: 12/13/2020   History of present illness PT is a 87 year old male who was brought to ED by his son for AMS and 2 falls in the home 36 hours prior to admission. Pt found to have low hemoglobin and transfused. 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   OT comments  Pt seen for OT tx this date. Pt required MIN-MOD A for bed mobility, MIN A + VC for RW mgt to stand EOB and MIN A to take a few lateral steps EOB prior to sitting. Set up and supervision for grooming tasks, requiring VC and Min A for slight posterior lean as pt fatigued in sitting. Pt desats with standing/steps requiring bump from 4.5L to 6L briefly to help him recover to low 90's SpO2. Left on 4.5L at end of session. Pt progressing, requiring less O2 since initial evaluation, but continues to require therapy to support return to PLOF. Continue to recommend STR at this time.    Recommendations for follow up therapy are one component of a multi-disciplinary discharge planning process, led by the attending physician.  Recommendations may be updated based on patient status, additional functional criteria and insurance authorization.    Follow Up Recommendations  Skilled nursing-short term rehab (<3 hours/day)    Assistance Recommended at Discharge Frequent or constant Supervision/Assistance  Equipment Recommendations  [facility]    Recommendations for Other Services      Precautions / Restrictions Precautions Precautions: Fall Precaution Comments: monitor O2 sats Restrictions Weight Bearing Restrictions: No       Mobility Bed Mobility Overal bed mobility: Needs Assistance Bed Mobility: Supine to Sit;Sit to Supine     Supine to sit: Min assist Sit to supine: Mod assist        Transfers Overall transfer level: Needs assistance Equipment used: Rolling walker (2 wheels) Transfers: Sit to/from Stand Sit to  Stand: Min assist;From elevated surface           General transfer comment: VC for hand placement     Balance Overall balance assessment: Needs assistance;History of Falls Sitting-balance support: Single extremity supported;No upper extremity supported;Feet supported Sitting balance-Leahy Scale: Fair Sitting balance - Comments: fair, sup-CGA, but began to fatigue after a while requiring a VC and CGA-MIN A to correct for slight posterior lean Postural control: Posterior lean Standing balance support: Reliant on assistive device for balance;During functional activity Standing balance-Leahy Scale: Poor Standing balance comment: requires BUE support on RW                           ADL either performed or assessed with clinical judgement   ADL                                         General ADL Comments: Pt tolerated sitting EOB for grooming tasks with set up and supervision-CGA for balance.     Vision       Perception     Praxis      Cognition Arousal/Alertness: Awake/alert Behavior During Therapy: WFL for tasks assessed/performed Overall Cognitive Status: Within Functional Limits for tasks assessed                                 General  Comments: HOH          Exercises Other Exercises Other Exercises: Pt instructed in pursed lip breathing to improve O2 sats.   Shoulder Instructions       General Comments Pt on 4.5L O2 at rest, SpO2 in high 80's - low 90's. Sitting EOB able to maintain with PRN VC for PLB. With standing however, desats to mid 80's requiring bump in O2 to 6L briefly before bringing back down to 4.5L O2.    Pertinent Vitals/ Pain       Pain Assessment: No/denies pain  Home Living                                          Prior Functioning/Environment              Frequency  Min 2X/week        Progress Toward Goals  OT Goals(current goals can now be found in the care plan  section)  Progress towards OT goals: Progressing toward goals  Acute Rehab OT Goals Patient Stated Goal: go home OT Goal Formulation: With patient/family Time For Goal Achievement: 12/26/20 Potential to Achieve Goals: Good  Plan Discharge plan remains appropriate;Frequency remains appropriate    Co-evaluation                 AM-PAC OT "6 Clicks" Daily Activity     Outcome Measure   Help from another person eating meals?: None Help from another person taking care of personal grooming?: A Little Help from another person toileting, which includes using toliet, bedpan, or urinal?: A Lot Help from another person bathing (including washing, rinsing, drying)?: A Lot Help from another person to put on and taking off regular upper body clothing?: A Little Help from another person to put on and taking off regular lower body clothing?: A Lot 6 Click Score: 16    End of Session Equipment Utilized During Treatment: Oxygen  OT Visit Diagnosis: Other abnormalities of gait and mobility (R26.89);Muscle weakness (generalized) (M62.81)   Activity Tolerance Patient tolerated treatment well   Patient Left in bed;with call bell/phone within reach;with family/visitor present   Nurse Communication          Time: 6195-0932 OT Time Calculation (min): 25 min  Charges: OT General Charges $OT Visit: 1 Visit OT Treatments $Self Care/Home Management : 23-37 mins  Ardeth Perfect., MPH, MS, OTR/L ascom [phone] 12/13/20, 5:42 PM

## 2020-12-13 NOTE — Progress Notes (Signed)
Initial Nutrition Assessment  DOCUMENTATION CODES:   Severe malnutrition in context of chronic illness  INTERVENTION:   -Ensure Enlive po TID, each supplement provides 350 kcal and 20 grams of protein  -Magic cup TID with meals, each supplement provides 290 kcal and 9 grams of protein  -MVI with minerals daily   NUTRITION DIAGNOSIS:   Severe Malnutrition related to chronic illness (CHF) as evidenced by moderate fat depletion, severe fat depletion, moderate muscle depletion, severe muscle depletion.  GOAL:   Patient will meet greater than or equal to 90% of their needs  MONITOR:   PO intake, Supplement acceptance, Diet advancement, Labs, Weight trends, Skin, I & O's  REASON FOR ASSESSMENT:   Malnutrition Screening Tool    ASSESSMENT:   Thomas Matthews is a 87 year old male with past medical history significant for essential hypertension, hyperlipidemia, BPH, GERD, iron deficiency anemia, Hx aortic stenosis s/p TAVR, cataract/macular degeneration, paroxysmal atrial fibrillation on Eliquis, CAD, SSS s/p PPM, chronic combined systolic and diastolic CHF (EF 75% TTE 64/33/29), pulmonary HTN who presented to [facility] ED on 10/26 via EMS due to confusion and multiple falls.  On EMS arrival, patient was noted to be in nonsustained ventricular tachycardia and was given 150 mg IV amiodarone and NS bolus in route to the hospital.  Also noted to be in bigeminy with recurrent runs of NSVT.  She reports large bowel movements recently but did not know if it contained any blood.  Pt admitted with altered mental status and increasing debility.   10/28- s/p BSE- advanced to clear liquids  Reviewed I/O's: -1.2 L x 24 hours and -664 ml since admission  UOP: 1.3 L x 24 hours  Spoke with pt at bedside, who reports he ate "all of his lunch" and it was "delicious". Noted pt has been advanced to a full liquid diet.   PTA pt reports fair appetite. He consumes a half of a breakfast sandwich in the  morning and eats the rest at lunch. He denies eating an evening meal. He drinks coffee and water throughout the day.   Reviewed wt hx; pt with history of distant weight loss.   Discussed importance of good meal and supplement intake to promote healing.   Medications reviewed and include folic acid, lasix, and thiamine.   Labs reviewed: K: 3.4.    NUTRITION - FOCUSED PHYSICAL EXAM:  Flowsheet Row Most Recent Value  Orbital Region Severe depletion  Upper Arm Region Severe depletion  Thoracic and Lumbar Region Moderate depletion  Buccal Region Severe depletion  Temple Region Severe depletion  Clavicle Bone Region Severe depletion  Clavicle and Acromion Bone Region Severe depletion  Scapular Bone Region Severe depletion  Dorsal Hand Severe depletion  Patellar Region Moderate depletion  Anterior Thigh Region Moderate depletion  Posterior Calf Region Moderate depletion  Edema (RD Assessment) Mild  Hair Reviewed  Eyes Reviewed  Mouth Reviewed  Skin Reviewed  Nails Reviewed       Diet Order:   Diet Order             Diet full liquid Room service appropriate? Yes; Fluid consistency: Thin  Diet effective now                   EDUCATION NEEDS:   Education needs have been addressed  Skin:  Skin Assessment: Skin Integrity Issues: Skin Integrity Issues:: Stage I, Other (Comment) Stage I: coccyx Other: rt lower skin tear  Last BM:  Unknown  Height:   Ht  Readings from Last 1 Encounters:  12/12/20 5\' 7"  (1.702 m)    Weight:   Wt Readings from Last 1 Encounters:  12/13/20 65.2 kg    Ideal Body Weight:  67.3 kg  BMI:  Body mass index is 22.51 kg/m.  Estimated Nutritional Needs:   Kcal:  6429-0379  Protein:  95-110 grams  Fluid:  > 1.7 L    Loistine Chance, RD, LDN, [place] Registered Dietitian II Certified Diabetes Care and Education Specialist Please refer to [facility] for RD and/or RD on-call/weekend/after hours pager

## 2020-12-14 DIAGNOSIS — E43 Unspecified severe protein-calorie malnutrition: Secondary | ICD-10-CM | POA: Insufficient documentation

## 2020-12-14 DIAGNOSIS — S0990XA Unspecified injury of head, initial encounter: Secondary | ICD-10-CM | POA: Diagnosis not present

## 2020-12-14 DIAGNOSIS — I4729 Other ventricular tachycardia: Secondary | ICD-10-CM | POA: Diagnosis not present

## 2020-12-14 DIAGNOSIS — Z952 Presence of prosthetic heart valve: Secondary | ICD-10-CM | POA: Diagnosis not present

## 2020-12-14 DIAGNOSIS — K922 Gastrointestinal hemorrhage, unspecified: Secondary | ICD-10-CM | POA: Diagnosis not present

## 2020-12-14 LAB — BASIC METABOLIC PANEL
Anion gap: 3 — ABNORMAL LOW (ref 5–15)
BUN: 28 mg/dL — ABNORMAL HIGH (ref 8–23)
CO2: 29 mmol/L (ref 22–32)
Calcium: 7.8 mg/dL — ABNORMAL LOW (ref 8.9–10.3)
Chloride: 103 mmol/L (ref 98–111)
Creatinine, Ser: 0.88 mg/dL (ref 0.61–1.24)
GFR, Estimated: >60 mL/min (ref >60)
Glucose, Bld: 110 mg/dL — ABNORMAL HIGH (ref 70–99)
Potassium: 3.9 mmol/L (ref 3.5–5.1)
Sodium: 135 mmol/L (ref 135–145)

## 2020-12-14 LAB — CBC
HCT: 28.4 % — ABNORMAL LOW (ref 39.0–52.0)
Hemoglobin: 9.8 g/dL — ABNORMAL LOW (ref 13.0–17.0)
MCH: 33.9 pg (ref 26.0–34.0)
MCHC: 34.5 g/dL (ref 30.0–36.0)
MCV: 98.3 fL (ref 80.0–100.0)
Platelets: 145 10*3/uL — ABNORMAL LOW (ref 150–400)
RBC: 2.89 MIL/uL — ABNORMAL LOW (ref 4.22–5.81)
RDW: 19.1 % — ABNORMAL HIGH (ref 11.5–15.5)
WBC: 9.7 10*3/uL (ref 4.0–10.5)
nRBC: 0.4 % — ABNORMAL HIGH (ref 0.0–0.2)

## 2020-12-14 LAB — OCCULT BLOOD X 1 CARD TO LAB, STOOL: Fecal Occult Bld: POSITIVE — AB

## 2020-12-14 LAB — CALCIUM, IONIZED: Calcium, Ionized, Serum: 4.6 mg/dL (ref 4.5–5.6)

## 2020-12-14 LAB — MAGNESIUM: Magnesium: 2.2 mg/dL (ref 1.7–2.4)

## 2020-12-14 MED ORDER — FERROUS SULFATE 325 (65 FE) MG PO TABS
325.0000 mg | ORAL_TABLET | Freq: Two times a day (BID) | ORAL | Status: DC
  Administered 2020-12-14 – 2020-12-17 (×6): 325 mg via ORAL
  Filled 2020-12-14 (×6): qty 1

## 2020-12-14 MED ORDER — FUROSEMIDE 10 MG/ML IJ SOLN
40.0000 mg | Freq: Two times a day (BID) | INTRAMUSCULAR | Status: DC
  Administered 2020-12-14 (×2): 40 mg via INTRAVENOUS
  Filled 2020-12-14 (×2): qty 4

## 2020-12-14 NOTE — Progress Notes (Addendum)
CCMD called and reported that pt just have 14 beats of non sustain vtach. Pt asymtomatic on assessment. NP Randol Kern made aware. Will continue to monitor.  Update 2224: No new order place. Will continue to monitor.

## 2020-12-14 NOTE — Progress Notes (Signed)
Speech Language Pathology Treatment:    Patient Details Name: Thomas Matthews MRN: [id number] DOB: 09-Nov-1923 Today's Date: 12/14/2020 Time: 1100-1115 SLP Time Calculation (min) (ACUTE ONLY): 15 min  Assessment / Plan / Recommendation Clinical Impression  Pt seen for clincial swallowing re-evaluation. SLP contacted Dr. [place] prior to re-evaluation to clarify diet restrictions as pt upgraded to full liquid diet from clear liquids yesterday. Per MD, pt with no solid food restrictions and MD planning to advance pt's diet to soft this date.   Pt observed with solids (2 saltine crackers), pureed (x3 bites applesauce), and thin liquids (~2 oz coffee via cup). Pt presents with s/sx mild oral dysphagia c/b mildly prolonged mastication of solids. Pt endorses this is baseline and that he consumes "soft" solids at home. Pharyngeal swallow appeared functional across trials with no overt or subtle s/sx pharyngeal dysphagia.   Recommend initiation of a mech soft diet with thin liquids with standard aspiration precautions. Meds as tolerated. MD to advance diet this date.   SLP to sign off as pt has no acute SLP needs at this time.   Pt, RN, and MD made aware of results, recommendations, and POC. Pt verbalized understanding/agreement.    HPI HPI: Per 24 H&P, 12/11/20, "The patient is a 87 yr old man who was brought to the ED by his son for the above chief complaints.      In the ED the patient was found to have a hemoglobin of 7.6. he also had 2 runs of NSVT for which he was started on amiodarone. He was also found to have maroon stools.  His potassium is low and has been supplemented. Magnesium is pending. The patient is being transfused with 1 unit PRBC's x 1.      [facility] hospitalists have been consulted to admit the patient for further evaluation and treatment.      He will be admitted to a telemetry bed. Contact and airborne precautions will be continued until his COVID test determines his COVID  status. He will be continued on amiodarone. Eliquis will be held. His H&H will be followed carefully. PT/OT will evaluate the patient for appropriate disposition when appropriate.      He denies fevers, chills, cough, shortness of breath. No nausea, vomiting, constipation or diarrhea. The maroon stools were noted by nursing in the ED. The patient states that he hasn't noted any change in his stools.      The patient will be admitted to a telemetry bed. He is receiving one unit of PRBC's now. His hemoglobin will be followed. Cardiology should be consulted in the am regarding the need for eliquis at this point more than 3 years after his TAVR."      SLP Plan  All goals met      Recommendations for follow up therapy are one component of a multi-disciplinary discharge planning process, led by the attending physician.  Recommendations may be updated based on patient status, additional functional criteria and insurance authorization.    Recommendations  Diet recommendations: Dysphagia 3 (mechanical soft);Thin liquid (MD to advance diet) Medication Administration:  (as tolerated) Supervision: Patient able to self feed Compensations: Slow rate;Small sips/bites; Dentures in place Postural Changes and/or Swallow Maneuvers: Seated upright 90 degrees                Oral Care Recommendations: Oral care BID Follow up Recommendations:  (defer to OT/PT) SLP Visit Diagnosis: Dysphagia, oral phase (R13.11) Plan: All goals met  Cherrie Gauze, M.S., [place]  12/14/2020, 11:20 AM

## 2020-12-14 NOTE — Progress Notes (Signed)
PROGRESS NOTE    Thomas Matthews  TDV:[id number] DOB: 06-18-1923 DOA: 12/11/2020 PCP: Birdie Sons, MD    Brief Narrative:  Thomas Matthews is a 87 year old male with past medical history significant for essential hypertension, hyperlipidemia, BPH, GERD, iron deficiency anemia, Hx aortic stenosis s/p TAVR, cataract/macular degeneration, paroxysmal atrial fibrillation on Eliquis, CAD, SSS s/p PPM, chronic combined systolic and diastolic CHF (EF 06% TTE 26/94/85), pulmonary HTN who presented to [facility] ED on 10/26 via EMS due to confusion and multiple falls.  On EMS arrival, patient was noted to be in nonsustained ventricular tachycardia and was given 150 mg IV amiodarone and NS bolus in route to the hospital.  Also noted to be in bigeminy with recurrent runs of NSVT.  She reports large bowel movements recently but did not know if it contained any blood.  In the ED, temperature 97.4 F, HR 81, RR 16, BP 175/72, SPO2 95% on 2 L nasal cannula.  WBC 12.4, hemoglobin 7.6, platelets 184.  Sodium 140, potassium 3.4, chloride 110, CO2 24, glucose 161, BUN 36, creatinine 0.78, AST 55, ALT 21, total bilirubin 1.2.  BNP 847.4.  High-sensitivity troponin 119>147.  COVID-19 PCR negative.  Influenza A/B PCR negative.  Chest x-ray with enlargement of cardiac silhouette, noted to have replacement, asymmetric interstitial infiltrates right lung with bibasilar effusions, asymmetric pulmonary edema versus atypical infection.  CT head without contrast with no evidence of acute intracranial roundly, advanced chronic small vessel ischemic changes, generalized cerebral atrophy, biparietal scalp soft tissue swelling/hematoma, no evidence of acute fracture C-spine, multilevel degenerative changes with cervical spondylosis C-spine.   Assessment & Plan:   Active Problems:   Symptomatic anemia   GI bleeding   NSVT (nonsustained ventricular tachycardia)   Head trauma   History of transcatheter aortic valve replacement  (TAVR)   Pressure injury of skin   Protein-calorie malnutrition, severe   Acute metabolic encephalopathy, POA Patient presented via EMS after being found by family confused.  Patient is afebrile, with mildly elevated WBC count of 12.4.  Chest x-ray with findings of pulmonary edema versus atypical infection.  CT head with no acute intracranial findings.  Etiology likely multifactorial in the setting of symptomatic anemia, possible atypical pulmonary infection, CHF exacerbation, and NSVT. --Continue treatment as below  Symptomatic anemia Hx IDA Concern for GI bleed Patient presenting with hemoglobin 7.6.  History of iron deficiency anemia on home ferrous sulfate.  Anemia panel with iron 39, TIBC 325 and ferritin 64. S/p 2u pRBC 10/26-27. Hgb 7.6>10.9>11.7>11.9>11.4>10.5>10.0>9.8 --FOBT pending --Protonix 40 mg IV q12h --IV iron daily x 2; followed by ferrous sulfate 325mg  BID --CBC daily; transfuse for hemoglobin less than 8.0 given cardiac issues --If FOBT positive, will consider GI evaluation; but likely poor candidate at this time given his high O2 needs  Nonsustained ventricular tachycardia Upon EMS arrival, patient was noted to be in ventricular tachycardia, received 150 mg IV bolus of amiodarone.  On telemetry patient continues to have intermittent NSVT. --Replaced electrolytes; goal K >4.0 and Mg >2.0 --Transfuse as above --Cardiology consulted for further consideration --Continue monitor on telemetry  Community-acquired pneumonia Chest x-ray with findings of asymmetric interstitial infiltrates right lung --WBC 16.0>11.2 --SLP evaluation pending --Azithromycin 500mg  IV q24h x 5 days --Ceftriaxone 2 g IV q24h x 5 days --CBC daily  Elevated troponin High sensitive troponin elevated on admission, 114>147; etiology likely secondary to demand type II ischemia from NSVT, anemia and CHF exacerbation.  Currently reports chest pain-free. --Continue monitor on telemetry  Acute  hypoxic respiratory failure, POA Acute on chronic combined systolic/diastolic congestive heart failure Patient notably hypoxic via EMS, placed on nasal cannula.  Became more short of breath requiring increased oxygen demand following transfusion IV fluid hydration on admission.  Chest x-ray notable for pulmonary edema versus asymmetric infection with bilateral pleural effusions.  BNP elevated.  TTE LVEF 50-55%, LV with no regional wall motion normalities, LA moderately dilated, mild-moderate MR.   --wt 71.8>66.7>65.2>68.5 --Furosemide 40 mg IV q12h --Continue supplemental oxygen, maintain SPO2 greater than 92%, on 2L [place] (not on home O2) --Strict I's and O's and daily weights  Paroxysmal atrial fibrillation --Holding home Eliquis as above due to concern for GI bleed, symptomatic anemia --Continue Cardizem 30 mg p.o. twice daily --Continue monitor on telemetry  Hypokalemia Hypomagnesemia Potassium 3.9 and Magnesium 2.2 this morning --Follow electrolytes daily  Essential hypertension --Increase dhome captopril to 12.5 mg p.o. twice daily --Diltiazem 30 mg p.o. twice daily --Hydralazine 25 mg PO q6h prn SBP >170 or DBP >110  BPH: Tamsulosin 0.4 mg p.o. daily  GERD: On omeprazole at home. --Protonix 40mg  IV q12h as above  Hx aortic stenosis s/p TAVR --Outpatient follow-up with cardiology   DVT prophylaxis: Place and maintain sequential compression device Start: 12/12/20 1410Place and maintain sequential compression device Start: 12/12/20 1410   Code Status: DNR Family Communication: No family present at bedside this morning, Neoma Laming, patient's daughter updated extensively yesterday.  Disposition Plan:  Level of care: Med-Surg Status is: Inpatient  Remains inpatient appropriate because: Continue monitoring of hemoglobin, IV diuresis, needs further titration of submental oxygen, pending placement.   Consultants:  Cardiology  Procedures:  None  Antimicrobials:   Azithromycin Ceftriaxone   Subjective: Patient seen examined at bedside, resting comfortably.  No specific complaints this morning.  Oxygen now titrated down to 2 L nasal cannula.  Seen by cardiologist morning with no further recommendations.  No bowel movement last 2-3 days. Denies headache, no chest pain, no shortness of breath, no cough/congestion, no nausea/vomiting/diarrhea, no chest pain, no palpitations, no abdominal pain.  No acute events overnight per nursing staff.  Objective: Vitals:   12/14/20 0500 12/14/20 0630 12/14/20 0647 12/14/20 0757  BP:  128/90 (!) 144/87 130/87  Pulse:  (!) 114 92 93  Resp:  16 (!) 22 19  Temp:  99.3 F (37.4 C)  98.2 F (36.8 C)  TempSrc:      SpO2:  97% 96% 96%  Weight: 68.5 kg     Height:        Intake/Output Summary (Last 24 hours) at 12/14/2020 1055 Last data filed at 12/13/2020 1700 Gross per 24 hour  Intake 1130 ml  Output 1600 ml  Net -470 ml   Filed Weights   12/12/20 2003 12/13/20 0500 12/14/20 0500  Weight: 66.7 kg 65.2 kg 68.5 kg    Examination:  General exam: Appears calm and comfortable, chronically ill in appearance Respiratory system: Decreased breath sounds bilateral bases, mild crackles, normal [place] effort without accessory muscle use, on 2 L [place] Cardiovascular system: S1 & S2 heard, RRR. No JVD, murmurs, rubs, gallops or clicks. No pedal edema. Gastrointestinal system: Abdomen is nondistended, soft and nontender. No organomegaly or masses felt. Normal bowel sounds heard. Central nervous system: Alert, oriented to place/time but not person. No focal neurological deficits. Extremities: Symmetric 5 x 5 power.  Moving all extremities independently Skin: No rashes, lesions or ulcers Psychiatry: Judgement and insight appear poor. Mood & affect appropriate.     Data Reviewed:  I have personally reviewed following labs and imaging studies  CBC: Recent Labs  Lab 12/11/20 1302 12/12/20 0018 12/12/20 0641  12/12/20 1451 12/12/20 2248 12/13/20 0024 12/13/20 1704 12/14/20 0431  WBC 12.4*  --  16.0*  --   --  11.2*  --  9.7  NEUTROABS 10.8*  --   --   --   --   --   --   --   HGB 7.6*   < > 11.7* 11.9* 11.4* 10.5* 10.0* 9.8*  HCT 22.6*   < > 33.5* 33.4* 32.5* 30.7* 30.3* 28.4*  MCV 105.6*  --  95.4  --   --  94.8  --  98.3  PLT 184  --  189  --   --  145*  --  145*   < > = values in this interval not displayed.   Basic Metabolic Panel: Recent Labs  Lab 12/11/20 1302 12/12/20 0641 12/12/20 2248 12/13/20 0024 12/14/20 0431  NA 140 140  --  140 135  K 3.4* 3.0* 3.3* 3.4* 3.9  CL 110 103  --  103 103  CO2 24 25  --  30 29  GLUCOSE 161* 113*  --  107* 110*  BUN 36* 28*  --  25* 28*  CREATININE 0.78 0.86  --  0.84 0.88  CALCIUM 8.3* 8.5*  --  8.0* 7.8*  MG  --  1.9 2.0 1.9 2.2   GFR: Estimated Creatinine Clearance: 46.9 mL/min (by C-G formula based on SCr of 0.88 mg/dL). Liver Function Tests: Recent Labs  Lab 12/11/20 1302 12/12/20 0641 12/13/20 0024  AST 55* 61* 43*  ALT 21 26 22   ALKPHOS 59 64 48  BILITOT 1.2 2.0* 1.3*  PROT 6.1* 6.4* 5.6*  ALBUMIN 3.4* 3.3* 2.7*   No results for input(s): LIPASE, AMYLASE in the last 168 hours. No results for input(s): AMMONIA in the last 168 hours. Coagulation Profile: Recent Labs  Lab 12/12/20 0641  INR 1.3*   Cardiac Enzymes: No results for input(s): CKTOTAL, CKMB, CKMBINDEX, TROPONINI in the last 168 hours. BNP (last 3 results) No results for input(s): PROBNP in the last 8760 hours. HbA1C: No results for input(s): HGBA1C in the last 72 hours. CBG: No results for input(s): GLUCAP in the last 168 hours. Lipid Profile: No results for input(s): CHOL, HDL, LDLCALC, TRIG, CHOLHDL, LDLDIRECT in the last 72 hours. Thyroid Function Tests: No results for input(s): TSH, T4TOTAL, FREET4, T3FREE, THYROIDAB in the last 72 hours. Anemia Panel: Recent Labs    12/11/20 1302 12/12/20 0641  VITAMINB12  --  182  FOLATE  --  15.4   FERRITIN 64 89  TIBC 325 319  IRON 39* 81  RETICCTPCT  --  4.2*   Sepsis Labs: Recent Labs  Lab 12/11/20 1539  PROCALCITON <0.10    Recent Results (from the past 240 hour(s))  Resp Panel by RT-PCR (Flu A&B, Covid) Nasopharyngeal Swab     Status: None   Collection Time: 12/12/20 12:46 AM   Specimen: Nasopharyngeal Swab; Nasopharyngeal(NP) swabs in vial transport medium  Result Value Ref Range Status   SARS Coronavirus 2 by RT PCR NEGATIVE NEGATIVE Final    Comment: (NOTE) SARS-CoV-2 target nucleic acids are NOT DETECTED.  The SARS-CoV-2 RNA is generally detectable in upper respiratory specimens during the acute phase of infection. The lowest concentration of SARS-CoV-2 viral copies this assay can detect is 138 copies/mL. A negative result does not preclude SARS-Cov-2 infection and should not be used as the  sole basis for treatment or other patient management decisions. A negative result may occur with  improper specimen collection/handling, submission of specimen other than nasopharyngeal swab, presence of viral mutation(s) within the areas targeted by this assay, and inadequate number of viral copies(<138 copies/mL). A negative result must be combined with clinical observations, patient history, and epidemiological information. The expected result is Negative.  Fact Sheet for Patients:  EntrepreneurPulse.com.au  Fact Sheet for Healthcare Providers:  IncredibleEmployment.be  This test is no t yet approved or cleared by the [place] FDA and  has been authorized for detection and/or diagnosis of SARS-CoV-2 by FDA under an Emergency Use Authorization (EUA). This EUA will remain  in effect (meaning this test can be used) for the duration of the COVID-19 declaration under Section 564(b)(1) of the Act, 21 U.S.C.section 360bbb-3(b)(1), unless the authorization is terminated  or revoked sooner.       Influenza A by PCR NEGATIVE  NEGATIVE Final   Influenza B by PCR NEGATIVE NEGATIVE Final    Comment: (NOTE) The Xpert Xpress SARS-CoV-2/FLU/RSV plus assay is intended as an aid in the diagnosis of influenza from Nasopharyngeal swab specimens and should not be used as a sole basis for treatment. Nasal washings and aspirates are unacceptable for Xpert Xpress SARS-CoV-2/FLU/RSV testing.  Fact Sheet for Patients: EntrepreneurPulse.com.au  Fact Sheet for Healthcare Providers: IncredibleEmployment.be  This test is not yet approved or cleared by the [place] FDA and has been authorized for detection and/or diagnosis of SARS-CoV-2 by FDA under an Emergency Use Authorization (EUA). This EUA will remain in effect (meaning this test can be used) for the duration of the COVID-19 declaration under Section 564(b)(1) of the Act, 21 U.S.C. section 360bbb-3(b)(1), unless the authorization is terminated or revoked.  Performed at [facility], [address], [place], [place] [address]          Radiology Studies: ECHOCARDIOGRAM COMPLETE  Result Date: 12/13/2020    ECHOCARDIOGRAM REPORT   Patient Name:   SURAJ RAMDASS Greenblatt Date of Exam: 12/13/2020 Medical Rec #:  [id number]        Height:       67.0 in Accession #:    [id number]       Weight:       143.7 lb Date of Birth:  12/29/1923        BSA:          1.757 m Patient Age:    89 years         BP:           115/63 mmHg Patient Gender: M                HR:           86 bpm. Exam Location:  [facility] Procedure: 2D Echo, Cardiac Doppler and Color Doppler Indications:     Dyspnea R06.00  History:         Patient has no prior history of Echocardiogram examinations.                  Pacemaker. TAVR.  Sonographer:     Sherrie Sport Referring Phys:  [id number] Chanley Mcenery J [place] Diagnosing Phys: Serafina Royals MD  Sonographer Comments: Suboptimal apical window. IMPRESSIONS  1. Left ventricular ejection fraction, by estimation, is 50 to 55%. The left  ventricle has low normal function. The left ventricle has no regional wall motion abnormalities. Left ventricular diastolic parameters were normal.  2. Right ventricular systolic function is normal.  The right ventricular size is normal.  3. Left atrial size was moderately dilated.  4. The mitral valve is normal in structure. Mild to moderate mitral valve regurgitation.  5. The aortic valve has been repaired/replaced. Aortic valve regurgitation is not visualized. FINDINGS  Left Ventricle: Left ventricular ejection fraction, by estimation, is 50 to 55%. The left ventricle has low normal function. The left ventricle has no regional wall motion abnormalities. The left ventricular internal cavity size was normal in size. There is no left ventricular hypertrophy. Left ventricular diastolic parameters were normal. Right Ventricle: The right ventricular size is normal. No increase in right ventricular wall thickness. Right ventricular systolic function is normal. Left Atrium: Left atrial size was moderately dilated. Right Atrium: Right atrial size was normal in size. Pericardium: There is no evidence of pericardial effusion. Mitral Valve: The mitral valve is normal in structure. Mild to moderate mitral valve regurgitation. MV peak gradient, 6.0 mmHg. The mean mitral valve gradient is 2.0 mmHg. Tricuspid Valve: The tricuspid valve is normal in structure. Tricuspid valve regurgitation is mild. Aortic Valve: The aortic valve has been repaired/replaced. Aortic valve regurgitation is not visualized. Aortic valve mean gradient measures 8.7 mmHg. Aortic valve peak gradient measures 16.3 mmHg. Aortic valve area, by VTI measures 0.86 cm. There is a bioprosthetic valve present in the aortic position. Pulmonic Valve: The pulmonic valve was normal in structure. Pulmonic valve regurgitation is not visualized. Aorta: The aortic root and ascending aorta are structurally normal, with no evidence of dilitation. IAS/Shunts: No atrial level  shunt detected by color flow Doppler.  LEFT VENTRICLE PLAX 2D LVIDd:         5.40 cm LVIDs:         3.90 cm LV PW:         1.50 cm LV IVS:        1.00 cm LVOT diam:     2.00 cm LV SV:         28 LV SV Index:   16 LVOT Area:     3.14 cm  RIGHT VENTRICLE RV Basal diam:  5.00 cm RV S prime:     14.00 cm/s TAPSE (M-mode): 4.0 cm LEFT ATRIUM              Index         RIGHT ATRIUM           Index LA diam:        6.10 cm  3.47 cm/m    RA Area:     45.60 cm LA Vol (A2C):   178.0 ml 101.29 ml/m  RA Volume:   209.00 ml 118.93 ml/m LA Vol (A4C):   165.0 ml 93.89 ml/m LA Biplane Vol: 187.0 ml 106.41 ml/m  AORTIC VALVE                     PULMONIC VALVE AV Area (Vmax):    0.80 cm      PV Vmax:        0.58 m/s AV Area (Vmean):   0.81 cm      PV Vmean:       40.300 cm/s AV Area (VTI):     0.86 cm      PV VTI:         0.104 m AV Vmax:           201.67 cm/s   PV Peak grad:   1.4 mmHg AV Vmean:  138.000 cm/s  PV Mean grad:   1.0 mmHg AV VTI:            0.331 m       RVOT Peak grad: 2 mmHg AV Peak Grad:      16.3 mmHg AV Mean Grad:      8.7 mmHg LVOT Vmax:         51.60 cm/s LVOT Vmean:        35.800 cm/s LVOT VTI:          0.091 m LVOT/AV VTI ratio: 0.27  AORTA Ao Root diam: 2.42 cm MITRAL VALVE                TRICUSPID VALVE MV Area (PHT): 4.54 cm     TR Peak grad:   36.7 mmHg MV Area VTI:   1.15 cm     TR Vmax:        303.00 cm/s MV Peak grad:  6.0 mmHg MV Mean grad:  2.0 mmHg     SHUNTS MV Vmax:       1.22 m/s     Systemic VTI:  0.09 m MV Vmean:      64.7 cm/s    Systemic Diam: 2.00 cm MV Decel Time: 167 msec     Pulmonic VTI:  0.092 m MV E velocity: 107.00 cm/s Serafina Royals MD Electronically signed by Serafina Royals MD Signature Date/Time: 12/13/2020/3:44:54 PM    Final         Scheduled Meds:  captopril  12.5 mg Oral BID   feeding supplement  237 mL Oral TID BM   folic acid  1 mg Oral Daily   furosemide  40 mg Intravenous BID   influenza vaccine adjuvanted  0.5 mL Intramuscular Tomorrow-1000    metoprolol tartrate  25 mg Oral BID   multivitamin with minerals  1 tablet Oral Daily   pantoprazole (PROTONIX) IV  40 mg Intravenous Q12H   tamsulosin  0.4 mg Oral QPC breakfast   thiamine  100 mg Oral Daily   Continuous Infusions:  azithromycin 500 mg (12/13/20 1723)   cefTRIAXone (ROCEPHIN)  IV 2 g (12/13/20 1730)     LOS: 3 days    Time spent: 38 minutes spent on chart review, discussion with nursing staff, consultants, updating family and interview/physical exam; more than 50% of that time was spent in counseling and/or coordination of care.    Freman Lapage J [place] DO [facility] Available via Epic secure chat 7am-7pm After these hours, please refer to coverage provider listed on [email or web address] 12/14/2020, 10:55 AM

## 2020-12-14 NOTE — Progress Notes (Signed)
[facility] Cardiology [facility] Encounter Note  Patient: Thomas Matthews / Admit Date: 12/11/2020 / Date of Encounter: 12/14/2020, 7:01 AM   Subjective: 87 y.o. male with PMH of severe AS s/p TAVR 2019, chronic combined systolic and diastolic HF EF 98%, chronic a-fib on eliquis, CAD with known LAD 80%, SSS s/p dual chamber pacer 2006 with battery change 2014, HTN, HLD, pulmonary HTN. Pt presented to the ED yesterday with concerns for family reports of "not acting right" and two falls. Patent was found to be in vtach by EMS and given amiodarone and normal saline for management with reports of continued NSVT by ED. Pt was noted to have blood in his stool at the ED with a decreased hemoglobin of 7.6 treated with transfusion of 1 unit PRBCs.    10/27 Patient has had improvement in Hgb 11.7 after PRBC transfusion and states that he feels well right now. He denies any issues with chest pain, sob, palpitations, weakness, fatigue. He denies noticing any blood in his stool at home.   10/28.  Today patient states that he feels well with no complaints of chest pain, shortness of breath, lower extremity edema, fatigue, weakness.  Patient's hemoglobin has declined again to 10.5.  Currently awaiting fecal occult blood testing and possible GI consult.  10/29.  Review of telemetry shows that the patient continues to have atrial fibrillation with controlled ventricular rate and frequent preventricular contractions with ventricular couplets and triplets.  No evidence of long runs of wide-complex tachycardia.  Patient overall is slowly improving from pneumonia hypoxia at this time.  No evidence of chest discomfort or congestive heart failure symptoms   Review of Systems: Positive [id number] Negative for: Vision change, hearing change, syncope, dizziness, nausea, vomiting,diarrhea, bloody stool, stomach pain, cough, congestion, diaphoresis, urinary frequency, urinary pain,skin lesions, skin rashes Others previously  listed  Objective: Telemetry: atrial fibrillation with nonspecific conduction delay and very frequent PVCs. There have seen some episodes of nonsustained wide complex tachycardia  Physical Exam: Blood pressure (!) 144/87, pulse 92, temperature 99.3 F (37.4 C), resp. rate (!) 22, height 5\' 7"  (1.702 m), weight 65.2 kg, SpO2 96 %. Body mass index is 22.51 kg/m. General: Elderly and fail appearing, in no acute distress. Head: Normocephalic, atraumatic, sclera non-icteric, no xanthomas, nares are without discharge. Neck: No apparent masses Lungs: Normal respirations with no wheezes, no rhonchi, no rales , no crackles   Heart: irregular rate and rhythm, normal S1 S2, no murmur, no rub, no gallop, PMI is normal size and placement, carotid upstroke normal without bruit, jugular venous pressure normal Abdomen: Soft, non-tender, non-distended with normoactive bowel sounds. No hepatosplenomegaly. Abdominal aorta is normal size without bruit Extremities: No edema, no clubbing, no cyanosis, no ulcers,  Peripheral: 2+ radial, 2+ femoral, 2+ dorsal pedal pulses Neuro: Alert and oriented. Moves all extremities spontaneously. Psych:  Responds to questions appropriately with a normal affect.   Intake/Output Summary (Last 24 hours) at 12/14/2020 0701 Last data filed at 12/13/2020 1700 Gross per 24 hour  Intake 1130 ml  Output 1600 ml  Net -470 ml     Inpatient Medications:   captopril  12.5 mg Oral BID   feeding supplement  237 mL Oral TID BM   folic acid  1 mg Oral Daily   furosemide  40 mg Intravenous BID   influenza vaccine adjuvanted  0.5 mL Intramuscular Tomorrow-1000   metoprolol tartrate  25 mg Oral BID   multivitamin with minerals  1 tablet Oral Daily  pantoprazole (PROTONIX) IV  40 mg Intravenous Q12H   tamsulosin  0.4 mg Oral QPC breakfast   thiamine  100 mg Oral Daily   Infusions:   azithromycin 500 mg (12/13/20 1723)   cefTRIAXone (ROCEPHIN)  IV 2 g (12/13/20 1730)     Labs: Recent Labs    12/13/20 0024 12/14/20 0431  NA 140 135  K 3.4* 3.9  CL 103 103  CO2 30 29  GLUCOSE 107* 110*  BUN 25* 28*  CREATININE 0.84 0.88  CALCIUM 8.0* 7.8*  MG 1.9 2.2    Recent Labs    12/12/20 0641 12/13/20 0024  AST 61* 43*  ALT 26 22  ALKPHOS 64 48  BILITOT 2.0* 1.3*  PROT 6.4* 5.6*  ALBUMIN 3.3* 2.7*    Recent Labs    12/11/20 1302 12/12/20 0018 12/13/20 0024 12/13/20 1704 12/14/20 0431  WBC 12.4*   < > 11.2*  --  9.7  NEUTROABS 10.8*  --   --   --   --   HGB 7.6*   < > 10.5* 10.0* 9.8*  HCT 22.6*   < > 30.7* 30.3* 28.4*  MCV 105.6*   < > 94.8  --  98.3  PLT 184   < > 145*  --  145*   < > = values in this interval not displayed.    No results for input(s): CKTOTAL, CKMB, TROPONINI in the last 72 hours. Invalid input(s): POCBNP No results for input(s): HGBA1C in the last 72 hours.   Weights: Filed Weights   12/11/20 1253 12/12/20 2003 12/13/20 0500  Weight: 71.8 kg 66.7 kg 65.2 kg     Radiology/Studies:  CT Head Wo Contrast  Result Date: 12/11/2020 CLINICAL DATA:  Head trauma, minor.  Neck trauma.  Fall. EXAM: CT HEAD WITHOUT CONTRAST CT CERVICAL SPINE WITHOUT CONTRAST TECHNIQUE: Multidetector CT imaging of the head and cervical spine was performed following the standard protocol without intravenous contrast. Multiplanar CT image reconstructions of the cervical spine were also generated. COMPARISON:  None. FINDINGS: CT HEAD FINDINGS Brain: Mild for age generalized cerebral atrophy. Advanced patchy and confluent hypoattenuation within the cerebral white matter, nonspecific but compatible chronic small vessel ischemic disease. There is no acute intracranial hemorrhage. No demarcated cortical infarct. No extra-axial fluid collection. No evidence of an intracranial mass. No midline shift. Vascular: No hyperdense vessel.  Atherosclerotic calcifications. Skull: Normal. Negative for fracture or focal lesion. Sinuses/Orbits: Visualized orbits  show no acute finding. Minimal mucosal thickening within the bilateral ethmoid, left sphenoid and left maxillary sinuses. Other: Trace fluid within the right mastoid air cells. Biparietal scalp soft tissue swelling/hematoma. CT CERVICAL SPINE FINDINGS Alignment: No significant spondylolisthesis. Skull base and vertebrae: The basion-dental and atlanto-dental intervals are maintained.No evidence of acute fracture to the cervical spine. Soft tissues and spinal canal: No prevertebral fluid or swelling. No visible canal hematoma. Disc levels: At C2-C3, there is early osseous fusion across the disc space and facet joint ankylosis on the right. Degenerative appearing fusion across the C5-C6 disc space. Additional cervical spondylosis with multilevel disc space narrowing, disc bulges/central disc protrusions, posterior disc osteophytes, endplate spurring, uncovertebral hypertrophy and facet arthrosis. Disc space narrowing is advanced at C3-C4, C4-C5 and C6-C7. No appreciable severe spinal canal stenosis. Multilevel bony neural foraminal narrowing. Upper chest: No consolidation within the imaged lung apices. No visible pneumothorax. Prominent biapical emphysema and bullae/blebs. IMPRESSION: CT head: 1. No evidence of acute intracranial abnormality. 2. Advanced chronic small vessel ischemic changes within the cerebral white  matter. 3. Mild for age generalized cerebral atrophy. 4. Biparietal scalp soft tissue swelling/hematoma. 5. Mild paranasal sinus mucosal thickening. 6. Trace fluid within the right mastoid air cells. CT cervical spine: 1. No evidence of acute fracture to the cervical spine. 2. Cervical spondylosis and multilevel degenerative appearing ankylosis, as described. 3.  Emphysema (ICD10-J43.9). Electronically Signed   By: Kellie Simmering D.O.   On: 12/11/2020 16:02   CT Cervical Spine Wo Contrast  Result Date: 12/11/2020 CLINICAL DATA:  Head trauma, minor.  Neck trauma.  Fall. EXAM: CT HEAD WITHOUT CONTRAST CT  CERVICAL SPINE WITHOUT CONTRAST TECHNIQUE: Multidetector CT imaging of the head and cervical spine was performed following the standard protocol without intravenous contrast. Multiplanar CT image reconstructions of the cervical spine were also generated. COMPARISON:  None. FINDINGS: CT HEAD FINDINGS Brain: Mild for age generalized cerebral atrophy. Advanced patchy and confluent hypoattenuation within the cerebral white matter, nonspecific but compatible chronic small vessel ischemic disease. There is no acute intracranial hemorrhage. No demarcated cortical infarct. No extra-axial fluid collection. No evidence of an intracranial mass. No midline shift. Vascular: No hyperdense vessel.  Atherosclerotic calcifications. Skull: Normal. Negative for fracture or focal lesion. Sinuses/Orbits: Visualized orbits show no acute finding. Minimal mucosal thickening within the bilateral ethmoid, left sphenoid and left maxillary sinuses. Other: Trace fluid within the right mastoid air cells. Biparietal scalp soft tissue swelling/hematoma. CT CERVICAL SPINE FINDINGS Alignment: No significant spondylolisthesis. Skull base and vertebrae: The basion-dental and atlanto-dental intervals are maintained.No evidence of acute fracture to the cervical spine. Soft tissues and spinal canal: No prevertebral fluid or swelling. No visible canal hematoma. Disc levels: At C2-C3, there is early osseous fusion across the disc space and facet joint ankylosis on the right. Degenerative appearing fusion across the C5-C6 disc space. Additional cervical spondylosis with multilevel disc space narrowing, disc bulges/central disc protrusions, posterior disc osteophytes, endplate spurring, uncovertebral hypertrophy and facet arthrosis. Disc space narrowing is advanced at C3-C4, C4-C5 and C6-C7. No appreciable severe spinal canal stenosis. Multilevel bony neural foraminal narrowing. Upper chest: No consolidation within the imaged lung apices. No visible  pneumothorax. Prominent biapical emphysema and bullae/blebs. IMPRESSION: CT head: 1. No evidence of acute intracranial abnormality. 2. Advanced chronic small vessel ischemic changes within the cerebral white matter. 3. Mild for age generalized cerebral atrophy. 4. Biparietal scalp soft tissue swelling/hematoma. 5. Mild paranasal sinus mucosal thickening. 6. Trace fluid within the right mastoid air cells. CT cervical spine: 1. No evidence of acute fracture to the cervical spine. 2. Cervical spondylosis and multilevel degenerative appearing ankylosis, as described. 3.  Emphysema (ICD10-J43.9). Electronically Signed   By: Kellie Simmering D.O.   On: 12/11/2020 16:02   DG Chest Portable 1 View  Result Date: 12/11/2020 CLINICAL DATA:  Brief episodes of ventricular tachycardia, slightly altered Mental status, history hypertension, coronary artery disease EXAM: PORTABLE CHEST 1 VIEW COMPARISON:  Portable exam 1319 hours compared to 06/10/2011 FINDINGS: LEFT subclavian sequential transvenous pacemaker leads project at RIGHT atrium and RIGHT ventricle. Enlargement of cardiac silhouette post AVR. Mediastinal contours and pulmonary vascularity normal with atherosclerotic calcification of the aortic arch. Infiltrates identified throughout RIGHT lung, question asymmetric edema versus infection. Skin folds project over the lower RIGHT chest. Tiny bibasilar pleural effusions. No pneumothorax Bones demineralized. IMPRESSION: Enlargement of cardiac silhouette post TAVR pacemaker. Asymmetric interstitial infiltrates throughout RIGHT lung with tiny bibasilar effusions, Western asymmetric pulmonary edema versus atypical infection. Electronically Signed   By: Lavonia Dana M.D.   On: 12/11/2020 14:04  ECHOCARDIOGRAM COMPLETE  Result Date: 12/13/2020    ECHOCARDIOGRAM REPORT   Patient Name:   Thomas Matthews Date of Exam: 12/13/2020 Medical Rec #:  [id number]        Height:       67.0 in Accession #:    [id number]       Weight:        143.7 lb Date of Birth:  12-10-1923        BSA:          1.757 m Patient Age:    89 years         BP:           115/63 mmHg Patient Gender: M                HR:           86 bpm. Exam Location:  [facility] Procedure: 2D Echo, Cardiac Doppler and Color Doppler Indications:     Dyspnea R06.00  History:         Patient has no prior history of Echocardiogram examinations.                  Pacemaker. TAVR.  Sonographer:     Sherrie Sport Referring Phys:  [id number] ERIC J [place] Diagnosing Phys: Serafina Royals MD  Sonographer Comments: Suboptimal apical window. IMPRESSIONS  1. Left ventricular ejection fraction, by estimation, is 50 to 55%. The left ventricle has low normal function. The left ventricle has no regional wall motion abnormalities. Left ventricular diastolic parameters were normal.  2. Right ventricular systolic function is normal. The right ventricular size is normal.  3. Left atrial size was moderately dilated.  4. The mitral valve is normal in structure. Mild to moderate mitral valve regurgitation.  5. The aortic valve has been repaired/replaced. Aortic valve regurgitation is not visualized. FINDINGS  Left Ventricle: Left ventricular ejection fraction, by estimation, is 50 to 55%. The left ventricle has low normal function. The left ventricle has no regional wall motion abnormalities. The left ventricular internal cavity size was normal in size. There is no left ventricular hypertrophy. Left ventricular diastolic parameters were normal. Right Ventricle: The right ventricular size is normal. No increase in right ventricular wall thickness. Right ventricular systolic function is normal. Left Atrium: Left atrial size was moderately dilated. Right Atrium: Right atrial size was normal in size. Pericardium: There is no evidence of pericardial effusion. Mitral Valve: The mitral valve is normal in structure. Mild to moderate mitral valve regurgitation. MV peak gradient, 6.0 mmHg. The mean mitral valve gradient is 2.0 mmHg.  Tricuspid Valve: The tricuspid valve is normal in structure. Tricuspid valve regurgitation is mild. Aortic Valve: The aortic valve has been repaired/replaced. Aortic valve regurgitation is not visualized. Aortic valve mean gradient measures 8.7 mmHg. Aortic valve peak gradient measures 16.3 mmHg. Aortic valve area, by VTI measures 0.86 cm. There is a bioprosthetic valve present in the aortic position. Pulmonic Valve: The pulmonic valve was normal in structure. Pulmonic valve regurgitation is not visualized. Aorta: The aortic root and ascending aorta are structurally normal, with no evidence of dilitation. IAS/Shunts: No atrial level shunt detected by color flow Doppler.  LEFT VENTRICLE PLAX 2D LVIDd:         5.40 cm LVIDs:         3.90 cm LV PW:         1.50 cm LV IVS:        1.00 cm LVOT diam:  2.00 cm LV SV:         28 LV SV Index:   16 LVOT Area:     3.14 cm  RIGHT VENTRICLE RV Basal diam:  5.00 cm RV S prime:     14.00 cm/s TAPSE (M-mode): 4.0 cm LEFT ATRIUM              Index         RIGHT ATRIUM           Index LA diam:        6.10 cm  3.47 cm/m    RA Area:     45.60 cm LA Vol (A2C):   178.0 ml 101.29 ml/m  RA Volume:   209.00 ml 118.93 ml/m LA Vol (A4C):   165.0 ml 93.89 ml/m LA Biplane Vol: 187.0 ml 106.41 ml/m  AORTIC VALVE                     PULMONIC VALVE AV Area (Vmax):    0.80 cm      PV Vmax:        0.58 m/s AV Area (Vmean):   0.81 cm      PV Vmean:       40.300 cm/s AV Area (VTI):     0.86 cm      PV VTI:         0.104 m AV Vmax:           201.67 cm/s   PV Peak grad:   1.4 mmHg AV Vmean:          138.000 cm/s  PV Mean grad:   1.0 mmHg AV VTI:            0.331 m       RVOT Peak grad: 2 mmHg AV Peak Grad:      16.3 mmHg AV Mean Grad:      8.7 mmHg LVOT Vmax:         51.60 cm/s LVOT Vmean:        35.800 cm/s LVOT VTI:          0.091 m LVOT/AV VTI ratio: 0.27  AORTA Ao Root diam: 2.42 cm MITRAL VALVE                TRICUSPID VALVE MV Area (PHT): 4.54 cm     TR Peak grad:   36.7 mmHg MV  Area VTI:   1.15 cm     TR Vmax:        303.00 cm/s MV Peak grad:  6.0 mmHg MV Mean grad:  2.0 mmHg     SHUNTS MV Vmax:       1.22 m/s     Systemic VTI:  0.09 m MV Vmean:      64.7 cm/s    Systemic Diam: 2.00 cm MV Decel Time: 167 msec     Pulmonic VTI:  0.092 m MV E velocity: 107.00 cm/s Serafina Royals MD Electronically signed by Serafina Royals MD Signature Date/Time: 12/13/2020/3:44:54 PM    Final      Assessment and Recommendation  87 y.o. male with PMH of severe AS s/p TAVR 2019, chronic combined systolic and diastolic HF EF 29%, chronic a-fib on eliquis, CAD with known LAD 80%, SSS s/p dual chamber pacer 2006 with battery change 2014, HTN, HLD, pulmonary HTN with concerns for several runs of wide-complex tachycardia as well as possible GI bleed on Eliquis.  Currently no evidence of acute coronary syndrome  or congestive heart failure at this time  Plan: 1.  Continue to hold Eliquis at this time pending fecal occult blood test and possible GI evaluation for possible GI bleed 2.  Continuation of metoprolol at current dose for heart rate control of atrial fibrillation and risk reduction in PVCs and ventricular tachycardia 3.  Continue current management of symptomatic anemia and GI bleed and/or other diagnostics necessary for evaluation of that issue.  Patient is at low risk for cardiac complication with endoscopy if necessary 4.  No further cardiac diagnostic testing necessary at this time  5.  Continue supportive care and treatment of pneumonia and hypoxia exacerbating above 6.  Further treatment options after above Signed, Serafina Royals, MD, [facility]

## 2020-12-14 NOTE — Plan of Care (Signed)
  Problem: Clinical Measurements: Goal: Will remain free from infection Outcome: Progressing   Problem: Clinical Measurements: Goal: Respiratory complications will improve Outcome: Progressing   Problem: Clinical Measurements: Goal: Cardiovascular complication will be avoided Outcome: Progressing   Problem: Safety: Goal: Ability to remain free from injury will improve Outcome: Progressing

## 2020-12-15 DIAGNOSIS — E43 Unspecified severe protein-calorie malnutrition: Secondary | ICD-10-CM | POA: Diagnosis not present

## 2020-12-15 DIAGNOSIS — K922 Gastrointestinal hemorrhage, unspecified: Secondary | ICD-10-CM | POA: Diagnosis not present

## 2020-12-15 DIAGNOSIS — I4729 Other ventricular tachycardia: Secondary | ICD-10-CM | POA: Diagnosis not present

## 2020-12-15 DIAGNOSIS — I4891 Unspecified atrial fibrillation: Secondary | ICD-10-CM

## 2020-12-15 DIAGNOSIS — Z952 Presence of prosthetic heart valve: Secondary | ICD-10-CM | POA: Diagnosis not present

## 2020-12-15 LAB — CBC
HCT: 28.9 % — ABNORMAL LOW (ref 39.0–52.0)
Hemoglobin: 10 g/dL — ABNORMAL LOW (ref 13.0–17.0)
MCH: 33.9 pg (ref 26.0–34.0)
MCHC: 34.6 g/dL (ref 30.0–36.0)
MCV: 98 fL (ref 80.0–100.0)
Platelets: 147 10*3/uL — ABNORMAL LOW (ref 150–400)
RBC: 2.95 MIL/uL — ABNORMAL LOW (ref 4.22–5.81)
RDW: 18.5 % — ABNORMAL HIGH (ref 11.5–15.5)
WBC: 7.2 10*3/uL (ref 4.0–10.5)
nRBC: 0.6 % — ABNORMAL HIGH (ref 0.0–0.2)

## 2020-12-15 LAB — FOLATE RBC
Folate, Hemolysate: 337 ng/mL
Folate, RBC: 1003 ng/mL (ref >498)
Hematocrit: 33.6 % — ABNORMAL LOW (ref 37.5–51.0)

## 2020-12-15 LAB — BASIC METABOLIC PANEL
Anion gap: 8 (ref 5–15)
BUN: 27 mg/dL — ABNORMAL HIGH (ref 8–23)
CO2: 29 mmol/L (ref 22–32)
Calcium: 7.7 mg/dL — ABNORMAL LOW (ref 8.9–10.3)
Chloride: 98 mmol/L (ref 98–111)
Creatinine, Ser: 0.96 mg/dL (ref 0.61–1.24)
GFR, Estimated: >60 mL/min (ref >60)
Glucose, Bld: 103 mg/dL — ABNORMAL HIGH (ref 70–99)
Potassium: 3.4 mmol/L — ABNORMAL LOW (ref 3.5–5.1)
Sodium: 135 mmol/L (ref 135–145)

## 2020-12-15 LAB — MAGNESIUM: Magnesium: 2.3 mg/dL (ref 1.7–2.4)

## 2020-12-15 MED ORDER — AMIODARONE HCL 200 MG PO TABS
200.0000 mg | ORAL_TABLET | Freq: Two times a day (BID) | ORAL | Status: DC
  Administered 2020-12-15 – 2020-12-17 (×5): 200 mg via ORAL
  Filled 2020-12-15 (×5): qty 1

## 2020-12-15 MED ORDER — FUROSEMIDE 10 MG/ML IJ SOLN
40.0000 mg | Freq: Every day | INTRAMUSCULAR | Status: DC
  Administered 2020-12-15: 10:00:00 40 mg via INTRAVENOUS
  Filled 2020-12-15: qty 4

## 2020-12-15 MED ORDER — POTASSIUM CHLORIDE 20 MEQ PO PACK
40.0000 meq | PACK | ORAL | Status: AC
  Administered 2020-12-15 (×2): 40 meq via ORAL
  Filled 2020-12-15 (×2): qty 2

## 2020-12-15 NOTE — Progress Notes (Signed)
PROGRESS NOTE    Thomas Matthews  ZHY:[id number] DOB: Jul 21, 1923 DOA: 12/11/2020 PCP: Birdie Sons, MD    Brief Narrative:  Thomas Matthews is a 87 year old male with past medical history significant for essential hypertension, hyperlipidemia, BPH, GERD, iron deficiency anemia, Hx aortic stenosis s/p TAVR, cataract/macular degeneration, paroxysmal atrial fibrillation on Eliquis, CAD, SSS s/p PPM, chronic combined systolic and diastolic CHF (EF 29% TTE 52/84/13), pulmonary HTN who presented to [facility] ED on 10/26 via EMS due to confusion and multiple falls.  On EMS arrival, patient was noted to be in nonsustained ventricular tachycardia and was given 150 mg IV amiodarone and NS bolus in route to the hospital.  Also noted to be in bigeminy with recurrent runs of NSVT.  She reports large bowel movements recently but did not know if it contained any blood.  In the ED, temperature 97.4 F, HR 81, RR 16, BP 175/72, SPO2 95% on 2 L nasal cannula.  WBC 12.4, hemoglobin 7.6, platelets 184.  Sodium 140, potassium 3.4, chloride 110, CO2 24, glucose 161, BUN 36, creatinine 0.78, AST 55, ALT 21, total bilirubin 1.2.  BNP 847.4.  High-sensitivity troponin 119>147.  COVID-19 PCR negative.  Influenza A/B PCR negative.  Chest x-ray with enlargement of cardiac silhouette, noted to have replacement, asymmetric interstitial infiltrates right lung with bibasilar effusions, asymmetric pulmonary edema versus atypical infection.  CT head without contrast with no evidence of acute intracranial roundly, advanced chronic small vessel ischemic changes, generalized cerebral atrophy, biparietal scalp soft tissue swelling/hematoma, no evidence of acute fracture C-spine, multilevel degenerative changes with cervical spondylosis C-spine.   Assessment & Plan:   Active Problems:   Symptomatic anemia   GI bleeding   NSVT (nonsustained ventricular tachycardia)   Head trauma   History of transcatheter aortic valve replacement  (TAVR)   Pressure injury of skin   Protein-calorie malnutrition, severe   Acute metabolic encephalopathy, POA Patient presented via EMS after being found by family confused.  Patient is afebrile, with mildly elevated WBC count of 12.4.  Chest x-ray with findings of pulmonary edema versus atypical infection.  CT head with no acute intracranial findings.  Etiology likely multifactorial in the setting of symptomatic anemia, possible atypical pulmonary infection, CHF exacerbation, and NSVT. --Continue treatment as below  Symptomatic anemia Hx IDA Concern for GI bleed Patient presenting with hemoglobin 7.6.  History of iron deficiency anemia on home ferrous sulfate.  Anemia panel with iron 39, TIBC 325 and ferritin 64. FOBT positive. S/p 2u pRBC 10/26-27. Hgb 7.6>10.9>11.7>11.9>11.4>10.5>10.0>9.8>10.0 --Protonix 40 mg IV q12h --IV iron daily x 2; followed by ferrous sulfate 325mg  BID --CBC daily; transfuse for hemoglobin less than 8.0 given cardiac issues --Given stability of hemoglobin and comorbidities, advanced age and cardiac issues, not a optimal candidate for aggressive treatment/endoscopic procedures.  Continue conservative care at this time unless decompensation.  Nonsustained ventricular tachycardia Upon EMS arrival, patient was noted to be in ventricular tachycardia, received 150 mg IV bolus of amiodarone.  On telemetry patient continues to have intermittent NSVT; with sustained event greater than 2 minutes this morning. --Cardiology following, appreciate assistance --Cardizem 30 mg p.o. twice daily --Starting amiodarone 200 mg p.o. twice daily --Maintain K >4.0 and Mg >2.0 --Continue monitor on telemetry; transfer to cardiac progressive unit today  Community-acquired pneumonia Chest x-ray with findings of asymmetric interstitial infiltrates right lung.  Evaluated by speech therapy on 10/29, nations of a mechanical soft diet with thin liquids with standard aspiration  precautions. --WBC 16.0>11.2>7.2 --Azithromycin 500mg   IV q24h x 5 days --Ceftriaxone 2 g IV q24h x 5 days --CBC daily  Elevated troponin High sensitive troponin elevated on admission, 114>147; etiology likely secondary to demand type II ischemia from NSVT, anemia and CHF exacerbation.  Currently reports chest pain-free. --Continue monitor on telemetry  Acute hypoxic respiratory failure, POA Acute on chronic combined systolic/diastolic congestive heart failure Patient notably hypoxic via EMS, placed on nasal cannula.  Became more short of breath requiring increased oxygen demand following transfusion IV fluid hydration on admission.  Chest x-ray notable for pulmonary edema versus asymmetric infection with bilateral pleural effusions.  BNP elevated.  TTE LVEF 50-55%, LV with no regional wall motion normalities, LA moderately dilated, mild-moderate MR.   --net negative 2L past 24h and net negative 3.1L since admission --wt 71.8>66.7>65.2>68.5>66.1kg --Furosemide 40 mg IV daily --Continue supplemental oxygen, maintain SPO2 > 92%, on 2L [place] (not on home O2) --Strict I's and O's and daily weights  Paroxysmal atrial fibrillation --Home Eliquis discontinued as above due to GI bleed/symptomatic anemia --Cardizem 30 mg p.o. twice daily --Metoprolol tartrate 25 mg p.o. twice daily --Continue monitor on telemetry  Hypokalemia Hypomagnesemia Potassium 3.6 and Magnesium 2.3 this morning; will replace potassium today --Follow electrolytes daily  Essential hypertension --Discontinued home captopril due to borderline hypotension --Diltiazem 30 mg p.o. twice daily --Hydralazine 25 mg PO q6h prn SBP >170 or DBP >110  BPH: Tamsulosin 0.4 mg p.o. daily  GERD: On omeprazole at home. --Protonix 40mg  IV q12h as above  Hx aortic stenosis s/p TAVR --Outpatient follow-up with cardiology   DVT prophylaxis: Place and maintain sequential compression device Start: 12/12/20 1410Place and maintain  sequential compression device Start: 12/12/20 1410   Code Status: DNR Family Communication: No family present at bedside this morning, Neoma Laming, patient's daughter updated extensively yesterday.  Disposition Plan:  Level of care: Progressive Cardiac Status is: Inpatient  Remains inpatient appropriate because: Continue monitoring of arrhythmia, nonsustained ventricular tachycardia on multiple occasions, starting amiodarone, further monitoring of hemoglobin, IV diuresis, needs further titration of oxygen, pending placement.   Consultants:  Cardiology  Procedures:  None  Antimicrobials:  Azithromycin Ceftriaxone   Subjective: Patient seen examined at bedside, resting comfortably.  No specific complaints this morning.  Oxygen now titrated down to 2 L nasal cannula.  FOBT returned positive yesterday, although hemoglobin trending up.  Eating breakfast with no other complaints this morning.  Denies headache, no chest pain, no shortness of breath, no cough/congestion, no nausea/vomiting/diarrhea, no chest pain, no palpitations, no abdominal pain.  No acute events overnight per nursing staff.  Addendum: Called to bedside by nursing staff with rapid response team for nonsustained ventricular tachycardia, now lasting roughly lasting minutes with HR 180-210.  Had recurrence x2 this morning.  Patient was asymptomatic, alert and oriented with no chest pain or shortness of breath.  Resolved with Valsalva maneuvers.  Cardiology, Dr. Nehemiah Massed was contacted and recommended initiation of amiodarone 20 mg p.o. twice daily.  Will transfer patient to cardiac progressive care.  Patient remains DNR.  No family present at bedside this morning, updated patient's daughter Neoma Laming via telephone this morning on events.  Objective: Vitals:   12/15/20 0954 12/15/20 0958 12/15/20 1016 12/15/20 1203  BP: (!) 72/51 (!) 108/52 (!) 85/48 (!) 91/50  Pulse: 96 73 85 77  Resp:   20 17  Temp:    (!) 97.5 F (36.4 C)   TempSrc:      SpO2: 98%  94% 97%  Weight:      Height:  Intake/Output Summary (Last 24 hours) at 12/15/2020 1206 Last data filed at 12/15/2020 1048 Gross per 24 hour  Intake 120 ml  Output 1400 ml  Net -1280 ml   Filed Weights   12/13/20 0500 12/14/20 0500 12/15/20 0644  Weight: 65.2 kg 68.5 kg 66.1 kg    Examination:  General exam: Appears calm and comfortable, chronically ill in appearance Respiratory system: Decreased breath sounds bilateral bases, mild crackles, normal [place] effort without accessory muscle use, on 2 L [place] Cardiovascular system: S1 & S2 heard, irregularly irregular rhythm, normal rate. No JVD, murmurs, rubs, gallops or clicks. No pedal edema. Gastrointestinal system: Abdomen is nondistended, soft and nontender. No organomegaly or masses felt. Normal bowel sounds heard. Central nervous system: Alert, oriented to place/time but not person. No focal neurological deficits. Extremities: Symmetric 5 x 5 power.  Moving all extremities independently Skin: No rashes, lesions or ulcers Psychiatry: Judgement and insight appear poor. Mood & affect appropriate.     Data Reviewed: I have personally reviewed following labs and imaging studies  CBC: Recent Labs  Lab 12/11/20 1302 12/12/20 0018 12/12/20 0641 12/12/20 1451 12/12/20 2248 12/13/20 0024 12/13/20 1704 12/14/20 0431 12/15/20 0443  WBC 12.4*  --  16.0*  --   --  11.2*  --  9.7 7.2  NEUTROABS 10.8*  --   --   --   --   --   --   --   --   HGB 7.6*   < > 11.7*   < > 11.4* 10.5* 10.0* 9.8* 10.0*  HCT 22.6*   < > 33.5*  33.6*   < > 32.5* 30.7* 30.3* 28.4* 28.9*  MCV 105.6*  --  95.4  --   --  94.8  --  98.3 98.0  PLT 184  --  189  --   --  145*  --  145* 147*   < > = values in this interval not displayed.   Basic Metabolic Panel: Recent Labs  Lab 12/11/20 1302 12/12/20 0641 12/12/20 2248 12/13/20 0024 12/14/20 0431 12/15/20 0443  NA 140 140  --  140 135 135  K 3.4* 3.0* 3.3* 3.4* 3.9  3.4*  CL 110 103  --  103 103 98  CO2 24 25  --  30 29 29   GLUCOSE 161* 113*  --  107* 110* 103*  BUN 36* 28*  --  25* 28* 27*  CREATININE 0.78 0.86  --  0.84 0.88 0.96  CALCIUM 8.3* 8.5*  --  8.0* 7.8* 7.7*  MG  --  1.9 2.0 1.9 2.2 2.3   GFR: Estimated Creatinine Clearance: 43 mL/min (by C-G formula based on SCr of 0.96 mg/dL). Liver Function Tests: Recent Labs  Lab 12/11/20 1302 12/12/20 0641 12/13/20 0024  AST 55* 61* 43*  ALT 21 26 22   ALKPHOS 59 64 48  BILITOT 1.2 2.0* 1.3*  PROT 6.1* 6.4* 5.6*  ALBUMIN 3.4* 3.3* 2.7*   No results for input(s): LIPASE, AMYLASE in the last 168 hours. No results for input(s): AMMONIA in the last 168 hours. Coagulation Profile: Recent Labs  Lab 12/12/20 0641  INR 1.3*   Cardiac Enzymes: No results for input(s): CKTOTAL, CKMB, CKMBINDEX, TROPONINI in the last 168 hours. BNP (last 3 results) No results for input(s): PROBNP in the last 8760 hours. HbA1C: No results for input(s): HGBA1C in the last 72 hours. CBG: No results for input(s): GLUCAP in the last 168 hours. Lipid Profile: No results for input(s): CHOL, HDL, LDLCALC,  TRIG, CHOLHDL, LDLDIRECT in the last 72 hours. Thyroid Function Tests: No results for input(s): TSH, T4TOTAL, FREET4, T3FREE, THYROIDAB in the last 72 hours. Anemia Panel: No results for input(s): VITAMINB12, FOLATE, FERRITIN, TIBC, IRON, RETICCTPCT in the last 72 hours.  Sepsis Labs: Recent Labs  Lab 12/11/20 1539  PROCALCITON <0.10    Recent Results (from the past 240 hour(s))  Resp Panel by RT-PCR (Flu A&B, Covid) Nasopharyngeal Swab     Status: None   Collection Time: 12/12/20 12:46 AM   Specimen: Nasopharyngeal Swab; Nasopharyngeal(NP) swabs in vial transport medium  Result Value Ref Range Status   SARS Coronavirus 2 by RT PCR NEGATIVE NEGATIVE Final    Comment: (NOTE) SARS-CoV-2 target nucleic acids are NOT DETECTED.  The SARS-CoV-2 RNA is generally detectable in upper respiratory specimens  during the acute phase of infection. The lowest concentration of SARS-CoV-2 viral copies this assay can detect is 138 copies/mL. A negative result does not preclude SARS-Cov-2 infection and should not be used as the sole basis for treatment or other patient management decisions. A negative result may occur with  improper specimen collection/handling, submission of specimen other than nasopharyngeal swab, presence of viral mutation(s) within the areas targeted by this assay, and inadequate number of viral copies(<138 copies/mL). A negative result must be combined with clinical observations, patient history, and epidemiological information. The expected result is Negative.  Fact Sheet for Patients:  EntrepreneurPulse.com.au  Fact Sheet for Healthcare Providers:  IncredibleEmployment.be  This test is no t yet approved or cleared by the [place] FDA and  has been authorized for detection and/or diagnosis of SARS-CoV-2 by FDA under an Emergency Use Authorization (EUA). This EUA will remain  in effect (meaning this test can be used) for the duration of the COVID-19 declaration under Section 564(b)(1) of the Act, 21 U.S.C.section 360bbb-3(b)(1), unless the authorization is terminated  or revoked sooner.       Influenza A by PCR NEGATIVE NEGATIVE Final   Influenza B by PCR NEGATIVE NEGATIVE Final    Comment: (NOTE) The Xpert Xpress SARS-CoV-2/FLU/RSV plus assay is intended as an aid in the diagnosis of influenza from Nasopharyngeal swab specimens and should not be used as a sole basis for treatment. Nasal washings and aspirates are unacceptable for Xpert Xpress SARS-CoV-2/FLU/RSV testing.  Fact Sheet for Patients: EntrepreneurPulse.com.au  Fact Sheet for Healthcare Providers: IncredibleEmployment.be  This test is not yet approved or cleared by the [place] FDA and has been authorized for detection  and/or diagnosis of SARS-CoV-2 by FDA under an Emergency Use Authorization (EUA). This EUA will remain in effect (meaning this test can be used) for the duration of the COVID-19 declaration under Section 564(b)(1) of the Act, 21 U.S.C. section 360bbb-3(b)(1), unless the authorization is terminated or revoked.  Performed at [facility], [address] [place], [place] [address]          Radiology Studies: ECHOCARDIOGRAM COMPLETE  Result Date: 12/13/2020    ECHOCARDIOGRAM REPORT   Patient Name:   DONYA TOMARO Vantil Date of Exam: 12/13/2020 Medical Rec #:  [id number]        Height:       67.0 in Accession #:    [id number]       Weight:       143.7 lb Date of Birth:  [birth year]        BSA:          1.757 m Patient Age:    89 years  BP:           115/63 mmHg Patient Gender: M                HR:           86 bpm. Exam Location:  [facility] Procedure: 2D Echo, Cardiac Doppler and Color Doppler Indications:     Dyspnea R06.00  History:         Patient has no prior history of Echocardiogram examinations.                  Pacemaker. TAVR.  Sonographer:     Sherrie Sport Referring Phys:  [id number] Ajanae Virag J [place] Diagnosing Phys: Serafina Royals MD  Sonographer Comments: Suboptimal apical window. IMPRESSIONS  1. Left ventricular ejection fraction, by estimation, is 50 to 55%. The left ventricle has low normal function. The left ventricle has no regional wall motion abnormalities. Left ventricular diastolic parameters were normal.  2. Right ventricular systolic function is normal. The right ventricular size is normal.  3. Left atrial size was moderately dilated.  4. The mitral valve is normal in structure. Mild to moderate mitral valve regurgitation.  5. The aortic valve has been repaired/replaced. Aortic valve regurgitation is not visualized. FINDINGS  Left Ventricle: Left ventricular ejection fraction, by estimation, is 50 to 55%. The left ventricle has low normal function. The left ventricle has no  regional wall motion abnormalities. The left ventricular internal cavity size was normal in size. There is no left ventricular hypertrophy. Left ventricular diastolic parameters were normal. Right Ventricle: The right ventricular size is normal. No increase in right ventricular wall thickness. Right ventricular systolic function is normal. Left Atrium: Left atrial size was moderately dilated. Right Atrium: Right atrial size was normal in size. Pericardium: There is no evidence of pericardial effusion. Mitral Valve: The mitral valve is normal in structure. Mild to moderate mitral valve regurgitation. MV peak gradient, 6.0 mmHg. The mean mitral valve gradient is 2.0 mmHg. Tricuspid Valve: The tricuspid valve is normal in structure. Tricuspid valve regurgitation is mild. Aortic Valve: The aortic valve has been repaired/replaced. Aortic valve regurgitation is not visualized. Aortic valve mean gradient measures 8.7 mmHg. Aortic valve peak gradient measures 16.3 mmHg. Aortic valve area, by VTI measures 0.86 cm. There is a bioprosthetic valve present in the aortic position. Pulmonic Valve: The pulmonic valve was normal in structure. Pulmonic valve regurgitation is not visualized. Aorta: The aortic root and ascending aorta are structurally normal, with no evidence of dilitation. IAS/Shunts: No atrial level shunt detected by color flow Doppler.  LEFT VENTRICLE PLAX 2D LVIDd:         5.40 cm LVIDs:         3.90 cm LV PW:         1.50 cm LV IVS:        1.00 cm LVOT diam:     2.00 cm LV SV:         28 LV SV Index:   16 LVOT Area:     3.14 cm  RIGHT VENTRICLE RV Basal diam:  5.00 cm RV S prime:     14.00 cm/s TAPSE (M-mode): 4.0 cm LEFT ATRIUM              Index         RIGHT ATRIUM           Index LA diam:        6.10 cm  3.47 cm/m    RA Area:  45.60 cm LA Vol (A2C):   178.0 ml 101.29 ml/m  RA Volume:   209.00 ml 118.93 ml/m LA Vol (A4C):   165.0 ml 93.89 ml/m LA Biplane Vol: 187.0 ml 106.41 ml/m  AORTIC VALVE                      PULMONIC VALVE AV Area (Vmax):    0.80 cm      PV Vmax:        0.58 m/s AV Area (Vmean):   0.81 cm      PV Vmean:       40.300 cm/s AV Area (VTI):     0.86 cm      PV VTI:         0.104 m AV Vmax:           201.67 cm/s   PV Peak grad:   1.4 mmHg AV Vmean:          138.000 cm/s  PV Mean grad:   1.0 mmHg AV VTI:            0.331 m       RVOT Peak grad: 2 mmHg AV Peak Grad:      16.3 mmHg AV Mean Grad:      8.7 mmHg LVOT Vmax:         51.60 cm/s LVOT Vmean:        35.800 cm/s LVOT VTI:          0.091 m LVOT/AV VTI ratio: 0.27  AORTA Ao Root diam: 2.42 cm MITRAL VALVE                TRICUSPID VALVE MV Area (PHT): 4.54 cm     TR Peak grad:   36.7 mmHg MV Area VTI:   1.15 cm     TR Vmax:        303.00 cm/s MV Peak grad:  6.0 mmHg MV Mean grad:  2.0 mmHg     SHUNTS MV Vmax:       1.22 m/s     Systemic VTI:  0.09 m MV Vmean:      64.7 cm/s    Systemic Diam: 2.00 cm MV Decel Time: 167 msec     Pulmonic VTI:  0.092 m MV E velocity: 107.00 cm/s Serafina Royals MD Electronically signed by Serafina Royals MD Signature Date/Time: 12/13/2020/3:44:54 PM    Final         Scheduled Meds:  amiodarone  200 mg Oral BID   feeding supplement  237 mL Oral TID BM   ferrous sulfate  325 mg Oral BID WC   folic acid  1 mg Oral Daily   furosemide  40 mg Intravenous Daily   influenza vaccine adjuvanted  0.5 mL Intramuscular Tomorrow-1000   metoprolol tartrate  25 mg Oral BID   multivitamin with minerals  1 tablet Oral Daily   pantoprazole (PROTONIX) IV  40 mg Intravenous Q12H   potassium chloride  40 mEq Oral Q3H   tamsulosin  0.4 mg Oral QPC breakfast   thiamine  100 mg Oral Daily   Continuous Infusions:  azithromycin 500 mg (12/14/20 1800)   cefTRIAXone (ROCEPHIN)  IV 2 g (12/14/20 1759)     LOS: 4 days    Time spent: 38 minutes spent on chart review, discussion with nursing staff, consultants, updating family and interview/physical exam; more than 50% of that time was spent in counseling and/or  coordination of care.  Dovid Bartko J [place] DO [facility] Available via Epic secure chat 7am-7pm After these hours, please refer to coverage provider listed on [email or web address] 12/15/2020, 12:06 PM

## 2020-12-15 NOTE — Progress Notes (Signed)
Late note -Rapid Response- Called to room 124 for patient who was in SVT/VT rate in 160's to 180's. Patient as alert and oriented. B/P stable. No complaints of chest pain or any discomfort, Ask patient to perform valsalva maneuver x 2 and he converted to SR rate spontaneously. Patient also has history of SSS with placement of a pacemaker several years ago.Patient transferred to 2 A for further observation.

## 2020-12-15 NOTE — Progress Notes (Signed)
Pt found to have sustained V-tech episode. Pt is alert and oriented, denies any chest pain and SOB and no anxiety.  Charge nurse ,Rapid response called immediately,  MD paged ,  Rapid and MD responded immediately. Vitals checked, EKG done , pt bear down and pulse decrease. Cardiolgy also notified , new orders in  by cardiologist. Pt will be transferred to progressive care for close monitoring  12/15/20 0951  Assess: MEWS Score  BP (!) 71/57  Pulse Rate (!) 185  Resp 18  SpO2 98 %  O2 Device Nasal Cannula  O2 Flow Rate (L/min) 2 L/min  Assess: MEWS Score  MEWS Temp 0  MEWS Systolic 2  MEWS Pulse 3  MEWS RR 0  MEWS LOC 0  MEWS Score 5  MEWS Score Color Red  Assess: if the MEWS score is Yellow or Red  Were vital signs taken at a resting state? Yes  Focused Assessment Change from prior assessment (see assessment flowsheet)  Does the patient meet 2 or more of the SIRS criteria? No  MEWS guidelines implemented *See Row Information* Yes  Treat  Pain Scale 0-10  Pain Score 0  Escalate  MEWS: Escalate Red: discuss with charge nurse/RN and provider, consider discussing with RRT  Notify: Charge Nurse/RN  Name of Charge Nurse/RN Notified Malka RN  Date Charge Nurse/RN Notified 12/15/20  Time Charge Nurse/RN Notified 2202  Notify: Provider  Provider Name/Title Dr Eric [place]  Date Provider Notified 12/15/20  Time Provider Notified [phone]  Notification Type Page  Notification Reason Change in status  Provider response At bedside  Date of Provider Response 12/15/20  Time of Provider Response [phone]  Notify: Rapid Response  Name of Rapid Response RN Notified Myra Flowers RN  Date Rapid Response Notified 12/15/20  Time Rapid Response Notified 0951  Document  Patient Outcome Transferred/level of care increased;Stabilized after interventions  Progress note created (see row info) Yes  Assess: SIRS CRITERIA  SIRS Temperature  0  SIRS Pulse 1  SIRS Respirations  0  SIRS WBC 0  SIRS Score  Sum  1

## 2020-12-16 LAB — CBC
HCT: 29.8 % — ABNORMAL LOW (ref 39.0–52.0)
Hemoglobin: 10.2 g/dL — ABNORMAL LOW (ref 13.0–17.0)
MCH: 34.1 pg — ABNORMAL HIGH (ref 26.0–34.0)
MCHC: 34.2 g/dL (ref 30.0–36.0)
MCV: 99.7 fL (ref 80.0–100.0)
Platelets: 155 10*3/uL (ref 150–400)
RBC: 2.99 MIL/uL — ABNORMAL LOW (ref 4.22–5.81)
RDW: 18 % — ABNORMAL HIGH (ref 11.5–15.5)
WBC: 8.1 10*3/uL (ref 4.0–10.5)
nRBC: 0 % (ref 0.0–0.2)

## 2020-12-16 LAB — BASIC METABOLIC PANEL
Anion gap: 7 (ref 5–15)
BUN: 35 mg/dL — ABNORMAL HIGH (ref 8–23)
CO2: 28 mmol/L (ref 22–32)
Calcium: 8 mg/dL — ABNORMAL LOW (ref 8.9–10.3)
Chloride: 99 mmol/L (ref 98–111)
Creatinine, Ser: 1 mg/dL (ref 0.61–1.24)
GFR, Estimated: >60 mL/min (ref >60)
Glucose, Bld: 110 mg/dL — ABNORMAL HIGH (ref 70–99)
Potassium: 4.5 mmol/L (ref 3.5–5.1)
Sodium: 134 mmol/L — ABNORMAL LOW (ref 135–145)

## 2020-12-16 LAB — MAGNESIUM
Magnesium: 2.2 mg/dL (ref 1.7–2.4)
Magnesium: 2.1 mg/dL (ref 1.7–2.4)

## 2020-12-16 LAB — POTASSIUM: Potassium: 4 mmol/L (ref 3.5–5.1)

## 2020-12-16 LAB — RESP PANEL BY RT-PCR (FLU A&B, COVID) ARPGX2
Influenza A by PCR: NEGATIVE
Influenza B by PCR: NEGATIVE
SARS Coronavirus 2 by RT PCR: NEGATIVE

## 2020-12-16 MED ORDER — METOPROLOL TARTRATE 5 MG/5ML IV SOLN: 2.5000 mg | Freq: Once | INTRAVENOUS | Status: DC

## 2020-12-16 MED ORDER — FUROSEMIDE 40 MG PO TABS
40.0000 mg | ORAL_TABLET | Freq: Every day | ORAL | Status: DC
  Administered 2020-12-16 – 2020-12-17 (×2): 40 mg via ORAL
  Filled 2020-12-16 (×2): qty 1

## 2020-12-16 NOTE — Discharge Summary (Signed)
Physician Discharge Summary  TIERRA DIVELBISS JKD:[id number] DOB: 24-May-1923 DOA: 12/11/2020  PCP: Birdie Sons, MD  Admit date: 12/11/2020 Discharge date: 12/17/2020  Admitted From: Home Disposition: [facility]  Recommendations for Outpatient Follow-up:  Follow up with PCP in 1-2 weeks Follow-up with cardiology, Dr. Nehemiah Massed 1-2 weeks following hospitalization Started on amiodarone and metoprolol for rate control for A. fib and NSVT Continue Protonix 40 mg p.o. twice daily x 4 weeks followed by once daily Increase ferrous sulfate to 325 mg p.o. twice daily Discontinued Eliquis for symptomatic anemia/GI bleed Please obtain BMP/CBC in one week to assess renal function and hemoglobin Consider outpatient referral to gastroenterology, but likely poor candidate for aggressive/endoscopic procedures given his advanced age and cardiac issues  Discharge Condition: Stable CODE STATUS: DNR Diet recommendation: Heart healthy diet  History of present illness:  OCTAVIOUS Matthews is a 87 year old male with past medical history significant for essential hypertension, hyperlipidemia, BPH, GERD, iron deficiency anemia, Hx aortic stenosis s/p TAVR, cataract/macular degeneration, paroxysmal atrial fibrillation on Eliquis, CAD, SSS s/p PPM, chronic combined systolic and diastolic CHF (EF 09% TTE 98/33/82), pulmonary HTN who presented to [facility] ED on 10/26 via EMS due to confusion and multiple falls.  On EMS arrival, patient was noted to be in nonsustained ventricular tachycardia and was given 150 mg IV amiodarone and NS bolus in route to the hospital.  Also noted to be in bigeminy with recurrent runs of NSVT.  She reports large bowel movements recently but did not know if it contained any blood.   In the ED, temperature 97.4 F, HR 81, RR 16, BP 175/72, SPO2 95% on 2 L nasal cannula.  WBC 12.4, hemoglobin 7.6, platelets 184.  Sodium 140, potassium 3.4, chloride 110, CO2 24, glucose 161, BUN 36,  creatinine 0.78, AST 55, ALT 21, total bilirubin 1.2.  BNP 847.4.  High-sensitivity troponin 119>147.  COVID-19 PCR negative.  Influenza A/B PCR negative.  Chest x-ray with enlargement of cardiac silhouette, noted to have replacement, asymmetric interstitial infiltrates right lung with bibasilar effusions, asymmetric pulmonary edema versus atypical infection.  CT head without contrast with no evidence of acute intracranial roundly, advanced chronic small vessel ischemic changes, generalized cerebral atrophy, biparietal scalp soft tissue swelling/hematoma, no evidence of acute fracture C-spine, multilevel degenerative changes with cervical spondylosis C-spine.  Hospital course:  Acute metabolic encephalopathy, POA Patient presented via EMS after being found by family confused.  Patient is afebrile, with mildly elevated WBC count of 12.4.  Chest x-ray with findings of pulmonary edema versus atypical infection.  CT head with no acute intracranial findings.  Etiology likely multifactorial in the setting of symptomatic anemia, possible atypical pulmonary infection, CHF exacerbation, and NSVT. Now resolved.   Symptomatic anemia Hx IDA Concern for GI bleed Patient presenting with hemoglobin 7.6.  History of iron deficiency anemia on home ferrous sulfate.  Anemia panel with iron 39, TIBC 325 and ferritin 64. FOBT positive.  Patient was transfused 2 unit PRBCs.  With improvement of hemoglobin.  Hemoglobin 10.2 at time of discharge.  Continue Protonix 40 mg p.o. twice daily x4 weeks followed by 40 mg daily.  Patient received IV iron infusions x2 during hospitalization and will continue ferrous sulfate 325 mg p.o. twice daily. Given stability of hemoglobin and comorbidities, advanced age and cardiac issues, not a optimal candidate for aggressive treatment/endoscopic procedures.  Recommend repeat CBC 1 week to assess hemoglobin level.   Nonsustained ventricular tachycardia Upon EMS arrival, patient was noted to be  in ventricular tachycardia, received 150 mg IV bolus of amiodarone.  On telemetry patient continues to have intermittent NSVT; during hospitalization and patient was started on amiodarone 200 mg p.o. twice daily.  Metoprolol tartrate was titrated up to 50 mg p.o. twice daily.  Outpatient follow-up with cardiology 1-2 weeks following hospitalization.   Community-acquired pneumonia Chest x-ray with findings of asymmetric interstitial infiltrates right lung.  Evaluated by speech therapy on 10/29, nations of a mechanical soft diet with thin liquids with standard aspiration precautions.  Completed 5-day course of azithromycin and ceftriaxone.   Elevated troponin High sensitive troponin elevated on admission, 114>147; etiology likely secondary to demand type II ischemia from NSVT, anemia and CHF exacerbation.  Currently reports chest pain-free.   Acute hypoxic respiratory failure, POA Acute on chronic combined systolic/diastolic congestive heart failure Patient notably hypoxic via EMS, placed on nasal cannula.  Became more short of breath requiring increased oxygen demand following transfusion IV fluid hydration on admission.  Chest x-ray notable for pulmonary edema versus asymmetric infection with bilateral pleural effusions.  BNP elevated.  TTE LVEF 50-55%, LV with no regional wall motion normalities, LA moderately dilated, mild-moderate MR. Patient was diuresed with IV furosemide during hospitalization with improvement of his respiratory status.  Will discharge on furosemide 40 mg p.o. daily.  BMP 1 week to assess renal function and potassium level.   Paroxysmal atrial fibrillation Home Eliquis discontinued as above due to GI bleed/symptomatic anemia. Amiodarone 200mg  PO BID, Metoprolol tartrate 50 mg p.o. twice daily.     Hypokalemia Hypomagnesemia Repleted during hospitalization.  Recommend repeat BMP 1 week.   Essential hypertension Continue metoprolol tartrate 50 mg p.o. twice daily, on  amiodarone.   BPH: Tamsulosin 0.4 mg p.o. daily, Detrol, oxybutynin    GERD: continue Protonix as above.  Hx aortic stenosis s/p TAVR Outpatient follow-up with cardiology  Discharge Diagnoses:  Active Problems:   NSVT (nonsustained ventricular tachycardia)   History of transcatheter aortic valve replacement (TAVR)   Pressure injury of skin   Protein-calorie malnutrition, severe    Discharge Instructions  Discharge Instructions     Diet - low sodium heart healthy   Complete by: As directed    Discharge wound care:   Complete by: As directed    Wound care to bilateral forearm skin tears:  Cleanse with NS, pat dry. Cover with folded layers of xeroform gauze, top with ABD pad and secure with Kerlix roll gauze/paper tape.  Change twice daily.   Increase activity slowly   Complete by: As directed       Allergies as of 12/17/2020       Reactions   Amlodipine Besylate Swelling   Felodipine Swelling   Oxybutynin Chloride Other (See Comments)   Other reaction(s): Dizziness Tolerates taking at night   Simvastatin    Other reaction(s): Dizziness   Terazosin    Other reaction(s): Low blood pressure        Medication List     STOP taking these medications    apixaban 2.5 MG Tabs tablet Commonly known as: ELIQUIS   captopril 12.5 MG tablet Commonly known as: CAPOTEN   diltiazem 30 MG tablet Commonly known as: CARDIZEM   omeprazole 20 MG capsule Commonly known as: PRILOSEC       TAKE these medications    amiodarone 200 MG tablet Commonly known as: PACERONE Take 1 tablet (200 mg total) by mouth 2 (two) times daily.   calcium citrate-vitamin D 315-200 MG-UNIT tablet Commonly known as: CITRACAL+D Take  2 tablets by mouth daily.   ferrous sulfate 325 (65 FE) MG tablet Take 1 tablet (325 mg total) by mouth 2 (two) times daily with a meal. What changed:  how much to take when to take this   furosemide 40 MG tablet Commonly known as: LASIX Take 1 tablet  (40 mg total) by mouth daily. What changed:  medication strength how much to take when to take this   metoprolol tartrate 25 MG tablet Commonly known as: LOPRESSOR Take 2 tablets (50 mg total) by mouth 2 (two) times daily.   oxybutynin 10 MG 24 hr tablet Commonly known as: DITROPAN-XL Take 10 mg by mouth at bedtime.   pantoprazole 40 MG tablet Commonly known as: Protonix Take 1 tablet (40 mg total) by mouth 2 (two) times daily for 28 days, THEN 1 tablet (40 mg total) 2 (two) times daily. Start taking on: December 17, 2020   pravastatin 20 MG tablet Commonly known as: PRAVACHOL Take 20 mg by mouth daily.   tamsulosin 0.4 MG Caps capsule Commonly known as: FLOMAX Take 0.4 mg by mouth daily after breakfast.   tolterodine 4 MG 24 hr capsule Commonly known as: DETROL LA Take 4 mg by mouth daily.               Discharge Care Instructions  (From admission, onward)           Start     Ordered   12/17/20 0000  Discharge wound care:       Comments: Wound care to bilateral forearm skin tears:  Cleanse with NS, pat dry. Cover with folded layers of xeroform gauze, top with ABD pad and secure with Kerlix roll gauze/paper tape.  Change twice daily.   12/17/20 0859            Follow-up Information     Birdie Sons, MD. Schedule an appointment as soon as possible for a visit in 1 week(s).   Specialty: Family Medicine Contact information: [address] [place] [address] [phone]         Corey Skains, MD. Schedule an appointment as soon as possible for a visit in 2 week(s).   Specialty: Cardiology Contact information: [facility] [place] [address] [phone]                Allergies  Allergen Reactions   Amlodipine Besylate Swelling   Felodipine Swelling   Oxybutynin Chloride Other (See Comments)    Other reaction(s): Dizziness Tolerates taking at night   Simvastatin      Other reaction(s): Dizziness   Terazosin     Other reaction(s): Low blood pressure    Consultations: Cardiology, Dr. Nehemiah Massed   Procedures/Studies: CT Head Wo Contrast  Result Date: 12/11/2020 CLINICAL DATA:  Head trauma, minor.  Neck trauma.  Fall. EXAM: CT HEAD WITHOUT CONTRAST CT CERVICAL SPINE WITHOUT CONTRAST TECHNIQUE: Multidetector CT imaging of the head and cervical spine was performed following the standard protocol without intravenous contrast. Multiplanar CT image reconstructions of the cervical spine were also generated. COMPARISON:  None. FINDINGS: CT HEAD FINDINGS Brain: Mild for age generalized cerebral atrophy. Advanced patchy and confluent hypoattenuation within the cerebral white matter, nonspecific but compatible chronic small vessel ischemic disease. There is no acute intracranial hemorrhage. No demarcated cortical infarct. No extra-axial fluid collection. No evidence of an intracranial mass. No midline shift. Vascular: No hyperdense vessel.  Atherosclerotic calcifications. Skull: Normal. Negative for fracture or focal lesion.  Sinuses/Orbits: Visualized orbits show no acute finding. Minimal mucosal thickening within the bilateral ethmoid, left sphenoid and left maxillary sinuses. Other: Trace fluid within the right mastoid air cells. Biparietal scalp soft tissue swelling/hematoma. CT CERVICAL SPINE FINDINGS Alignment: No significant spondylolisthesis. Skull base and vertebrae: The basion-dental and atlanto-dental intervals are maintained.No evidence of acute fracture to the cervical spine. Soft tissues and spinal canal: No prevertebral fluid or swelling. No visible canal hematoma. Disc levels: At C2-C3, there is early osseous fusion across the disc space and facet joint ankylosis on the right. Degenerative appearing fusion across the C5-C6 disc space. Additional cervical spondylosis with multilevel disc space narrowing, disc bulges/central disc protrusions, posterior disc  osteophytes, endplate spurring, uncovertebral hypertrophy and facet arthrosis. Disc space narrowing is advanced at C3-C4, C4-C5 and C6-C7. No appreciable severe spinal canal stenosis. Multilevel bony neural foraminal narrowing. Upper chest: No consolidation within the imaged lung apices. No visible pneumothorax. Prominent biapical emphysema and bullae/blebs. IMPRESSION: CT head: 1. No evidence of acute intracranial abnormality. 2. Advanced chronic small vessel ischemic changes within the cerebral white matter. 3. Mild for age generalized cerebral atrophy. 4. Biparietal scalp soft tissue swelling/hematoma. 5. Mild paranasal sinus mucosal thickening. 6. Trace fluid within the right mastoid air cells. CT cervical spine: 1. No evidence of acute fracture to the cervical spine. 2. Cervical spondylosis and multilevel degenerative appearing ankylosis, as described. 3.  Emphysema (ICD10-J43.9). Electronically Signed   By: Kellie Simmering D.O.   On: 12/11/2020 16:02   CT Cervical Spine Wo Contrast  Result Date: 12/11/2020 CLINICAL DATA:  Head trauma, minor.  Neck trauma.  Fall. EXAM: CT HEAD WITHOUT CONTRAST CT CERVICAL SPINE WITHOUT CONTRAST TECHNIQUE: Multidetector CT imaging of the head and cervical spine was performed following the standard protocol without intravenous contrast. Multiplanar CT image reconstructions of the cervical spine were also generated. COMPARISON:  None. FINDINGS: CT HEAD FINDINGS Brain: Mild for age generalized cerebral atrophy. Advanced patchy and confluent hypoattenuation within the cerebral white matter, nonspecific but compatible chronic small vessel ischemic disease. There is no acute intracranial hemorrhage. No demarcated cortical infarct. No extra-axial fluid collection. No evidence of an intracranial mass. No midline shift. Vascular: No hyperdense vessel.  Atherosclerotic calcifications. Skull: Normal. Negative for fracture or focal lesion. Sinuses/Orbits: Visualized orbits show no acute  finding. Minimal mucosal thickening within the bilateral ethmoid, left sphenoid and left maxillary sinuses. Other: Trace fluid within the right mastoid air cells. Biparietal scalp soft tissue swelling/hematoma. CT CERVICAL SPINE FINDINGS Alignment: No significant spondylolisthesis. Skull base and vertebrae: The basion-dental and atlanto-dental intervals are maintained.No evidence of acute fracture to the cervical spine. Soft tissues and spinal canal: No prevertebral fluid or swelling. No visible canal hematoma. Disc levels: At C2-C3, there is early osseous fusion across the disc space and facet joint ankylosis on the right. Degenerative appearing fusion across the C5-C6 disc space. Additional cervical spondylosis with multilevel disc space narrowing, disc bulges/central disc protrusions, posterior disc osteophytes, endplate spurring, uncovertebral hypertrophy and facet arthrosis. Disc space narrowing is advanced at C3-C4, C4-C5 and C6-C7. No appreciable severe spinal canal stenosis. Multilevel bony neural foraminal narrowing. Upper chest: No consolidation within the imaged lung apices. No visible pneumothorax. Prominent biapical emphysema and bullae/blebs. IMPRESSION: CT head: 1. No evidence of acute intracranial abnormality. 2. Advanced chronic small vessel ischemic changes within the cerebral white matter. 3. Mild for age generalized cerebral atrophy. 4. Biparietal scalp soft tissue swelling/hematoma. 5. Mild paranasal sinus mucosal thickening. 6. Trace fluid within the right mastoid air  cells. CT cervical spine: 1. No evidence of acute fracture to the cervical spine. 2. Cervical spondylosis and multilevel degenerative appearing ankylosis, as described. 3.  Emphysema (ICD10-J43.9). Electronically Signed   By: Kellie Simmering D.O.   On: 12/11/2020 16:02   DG Chest Portable 1 View  Result Date: 12/11/2020 CLINICAL DATA:  Brief episodes of ventricular tachycardia, slightly altered Mental status, history  hypertension, coronary artery disease EXAM: PORTABLE CHEST 1 VIEW COMPARISON:  Portable exam 1319 hours compared to 06/10/2011 FINDINGS: LEFT subclavian sequential transvenous pacemaker leads project at RIGHT atrium and RIGHT ventricle. Enlargement of cardiac silhouette post AVR. Mediastinal contours and pulmonary vascularity normal with atherosclerotic calcification of the aortic arch. Infiltrates identified throughout RIGHT lung, question asymmetric edema versus infection. Skin folds project over the lower RIGHT chest. Tiny bibasilar pleural effusions. No pneumothorax Bones demineralized. IMPRESSION: Enlargement of cardiac silhouette post TAVR pacemaker. Asymmetric interstitial infiltrates throughout RIGHT lung with tiny bibasilar effusions, Western asymmetric pulmonary edema versus atypical infection. Electronically Signed   By: Lavonia Dana M.D.   On: 12/11/2020 14:04   ECHOCARDIOGRAM COMPLETE  Result Date: 12/13/2020    ECHOCARDIOGRAM REPORT   Patient Name:   Thomas Matthews Termini Date of Exam: 12/13/2020 Medical Rec #:  [id number]        Height:       67.0 in Accession #:    [id number]       Weight:       143.7 lb Date of Birth:  03/07/1923        BSA:          1.757 m Patient Age:    87 years         BP:           115/63 mmHg Patient Gender: M                HR:           86 bpm. Exam Location:  [facility] Procedure: 2D Echo, Cardiac Doppler and Color Doppler Indications:     Dyspnea R06.00  History:         Patient has no prior history of Echocardiogram examinations.                  Pacemaker. TAVR.  Sonographer:     Sherrie Sport Referring Phys:  [id number] Jewett Mcgann J [place] Diagnosing Phys: Serafina Royals MD  Sonographer Comments: Suboptimal apical window. IMPRESSIONS  1. Left ventricular ejection fraction, by estimation, is 50 to 55%. The left ventricle has low normal function. The left ventricle has no regional wall motion abnormalities. Left ventricular diastolic parameters were normal.  2. Right ventricular systolic  function is normal. The right ventricular size is normal.  3. Left atrial size was moderately dilated.  4. The mitral valve is normal in structure. Mild to moderate mitral valve regurgitation.  5. The aortic valve has been repaired/replaced. Aortic valve regurgitation is not visualized. FINDINGS  Left Ventricle: Left ventricular ejection fraction, by estimation, is 50 to 55%. The left ventricle has low normal function. The left ventricle has no regional wall motion abnormalities. The left ventricular internal cavity size was normal in size. There is no left ventricular hypertrophy. Left ventricular diastolic parameters were normal. Right Ventricle: The right ventricular size is normal. No increase in right ventricular wall thickness. Right ventricular systolic function is normal. Left Atrium: Left atrial size was moderately dilated. Right Atrium: Right atrial size was normal in size. Pericardium: There is no evidence of pericardial  effusion. Mitral Valve: The mitral valve is normal in structure. Mild to moderate mitral valve regurgitation. MV peak gradient, 6.0 mmHg. The mean mitral valve gradient is 2.0 mmHg. Tricuspid Valve: The tricuspid valve is normal in structure. Tricuspid valve regurgitation is mild. Aortic Valve: The aortic valve has been repaired/replaced. Aortic valve regurgitation is not visualized. Aortic valve mean gradient measures 8.7 mmHg. Aortic valve peak gradient measures 16.3 mmHg. Aortic valve area, by VTI measures 0.86 cm. There is a bioprosthetic valve present in the aortic position. Pulmonic Valve: The pulmonic valve was normal in structure. Pulmonic valve regurgitation is not visualized. Aorta: The aortic root and ascending aorta are structurally normal, with no evidence of dilitation. IAS/Shunts: No atrial level shunt detected by color flow Doppler.  LEFT VENTRICLE PLAX 2D LVIDd:         5.40 cm LVIDs:         3.90 cm LV PW:         1.50 cm LV IVS:        1.00 cm LVOT diam:     2.00 cm LV  SV:         28 LV SV Index:   16 LVOT Area:     3.14 cm  RIGHT VENTRICLE RV Basal diam:  5.00 cm RV S prime:     14.00 cm/s TAPSE (M-mode): 4.0 cm LEFT ATRIUM              Index         RIGHT ATRIUM           Index LA diam:        6.10 cm  3.47 cm/m    RA Area:     45.60 cm LA Vol (A2C):   178.0 ml 101.29 ml/m  RA Volume:   209.00 ml 118.93 ml/m LA Vol (A4C):   165.0 ml 93.89 ml/m LA Biplane Vol: 187.0 ml 106.41 ml/m  AORTIC VALVE                     PULMONIC VALVE AV Area (Vmax):    0.80 cm      PV Vmax:        0.58 m/s AV Area (Vmean):   0.81 cm      PV Vmean:       40.300 cm/s AV Area (VTI):     0.86 cm      PV VTI:         0.104 m AV Vmax:           201.67 cm/s   PV Peak grad:   1.4 mmHg AV Vmean:          138.000 cm/s  PV Mean grad:   1.0 mmHg AV VTI:            0.331 m       RVOT Peak grad: 2 mmHg AV Peak Grad:      16.3 mmHg AV Mean Grad:      8.7 mmHg LVOT Vmax:         51.60 cm/s LVOT Vmean:        35.800 cm/s LVOT VTI:          0.091 m LVOT/AV VTI ratio: 0.27  AORTA Ao Root diam: 2.42 cm MITRAL VALVE                TRICUSPID VALVE MV Area (PHT): 4.54 cm     TR Peak grad:   36.7 mmHg  MV Area VTI:   1.15 cm     TR Vmax:        303.00 cm/s MV Peak grad:  6.0 mmHg MV Mean grad:  2.0 mmHg     SHUNTS MV Vmax:       1.22 m/s     Systemic VTI:  0.09 m MV Vmean:      64.7 cm/s    Systemic Diam: 2.00 cm MV Decel Time: 167 msec     Pulmonic VTI:  0.092 m MV E velocity: 107.00 cm/s Serafina Royals MD Electronically signed by Serafina Royals MD Signature Date/Time: 12/13/2020/3:44:54 PM    Final      Subjective: Patient seen examined bedside, resting comfortably.  No complaints this morning.  Discharging to SNF.  Denies headache, no chest pain, no palpitations, no shortness of breath, no fever/chills/night sweats, no nausea/vomiting/diarrhea, no abdominal pain, no cough/congestion.  No acute events overnight per nursing staff.  Discharge Exam: Vitals:   12/17/20 0424 12/17/20 0750  BP: (!) 161/81  138/72  Pulse: 67 73  Resp: 14 17  Temp: 97.8 F (36.6 C) 97.7 F (36.5 C)  SpO2: 97% 96%   Vitals:   12/17/20 0108 12/17/20 0424 12/17/20 0600 12/17/20 0750  BP: 136/83 (!) 161/81  138/72  Pulse: 76 67  73  Resp: 16 14  17   Temp: 98.1 F (36.7 C) 97.8 F (36.6 C)  97.7 F (36.5 C)  TempSrc:      SpO2: 96% 97%  96%  Weight:   71 kg   Height:        General: Pt is alert, awake, not in acute distress, elderly in appearance Cardiovascular: Irregularly irregular rhythm, normal rate, S1/S2 +, no rubs, no gallops Respiratory: CTA bilaterally, no wheezing, no rhonchi Abdominal: Soft, NT, ND, bowel sounds + Extremities: no edema, no cyanosis    The results of significant diagnostics from this hospitalization (including imaging, microbiology, ancillary and laboratory) are listed below for reference.     Microbiology: Recent Results (from the past 240 hour(s))  Resp Panel by RT-PCR (Flu A&B, Covid) Nasopharyngeal Swab     Status: None   Collection Time: 12/12/20 12:46 AM   Specimen: Nasopharyngeal Swab; Nasopharyngeal(NP) swabs in vial transport medium  Result Value Ref Range Status   SARS Coronavirus 2 by RT PCR NEGATIVE NEGATIVE Final    Comment: (NOTE) SARS-CoV-2 target nucleic acids are NOT DETECTED.  The SARS-CoV-2 RNA is generally detectable in upper respiratory specimens during the acute phase of infection. The lowest concentration of SARS-CoV-2 viral copies this assay can detect is 138 copies/mL. A negative result does not preclude SARS-Cov-2 infection and should not be used as the sole basis for treatment or other patient management decisions. A negative result may occur with  improper specimen collection/handling, submission of specimen other than nasopharyngeal swab, presence of viral mutation(s) within the areas targeted by this assay, and inadequate number of viral copies(<138 copies/mL). A negative result must be combined with clinical observations, patient  history, and epidemiological information. The expected result is Negative.  Fact Sheet for Patients:  EntrepreneurPulse.com.au  Fact Sheet for Healthcare Providers:  IncredibleEmployment.be  This test is no t yet approved or cleared by the [place] FDA and  has been authorized for detection and/or diagnosis of SARS-CoV-2 by FDA under an Emergency Use Authorization (EUA). This EUA will remain  in effect (meaning this test can be used) for the duration of the COVID-19 declaration under Section 564(b)(1) of the Act, 21  U.S.C.section 360bbb-3(b)(1), unless the authorization is terminated  or revoked sooner.       Influenza A by PCR NEGATIVE NEGATIVE Final   Influenza B by PCR NEGATIVE NEGATIVE Final    Comment: (NOTE) The Xpert Xpress SARS-CoV-2/FLU/RSV plus assay is intended as an aid in the diagnosis of influenza from Nasopharyngeal swab specimens and should not be used as a sole basis for treatment. Nasal washings and aspirates are unacceptable for Xpert Xpress SARS-CoV-2/FLU/RSV testing.  Fact Sheet for Patients: EntrepreneurPulse.com.au  Fact Sheet for Healthcare Providers: IncredibleEmployment.be  This test is not yet approved or cleared by the [place] FDA and has been authorized for detection and/or diagnosis of SARS-CoV-2 by FDA under an Emergency Use Authorization (EUA). This EUA will remain in effect (meaning this test can be used) for the duration of the COVID-19 declaration under Section 564(b)(1) of the Act, 21 U.S.C. section 360bbb-3(b)(1), unless the authorization is terminated or revoked.  Performed at [facility], [place], [place] [address]   Resp Panel by RT-PCR (Flu A&B, Covid) Nasopharyngeal Swab     Status: None   Collection Time: 12/16/20  2:15 PM   Specimen: Nasopharyngeal Swab; Nasopharyngeal(NP) swabs in vial transport medium  Result Value  Ref Range Status   SARS Coronavirus 2 by RT PCR NEGATIVE NEGATIVE Final    Comment: (NOTE) SARS-CoV-2 target nucleic acids are NOT DETECTED.  The SARS-CoV-2 RNA is generally detectable in upper respiratory specimens during the acute phase of infection. The lowest concentration of SARS-CoV-2 viral copies this assay can detect is 138 copies/mL. A negative result does not preclude SARS-Cov-2 infection and should not be used as the sole basis for treatment or other patient management decisions. A negative result may occur with  improper specimen collection/handling, submission of specimen other than nasopharyngeal swab, presence of viral mutation(s) within the areas targeted by this assay, and inadequate number of viral copies(<138 copies/mL). A negative result must be combined with clinical observations, patient history, and epidemiological information. The expected result is Negative.  Fact Sheet for Patients:  EntrepreneurPulse.com.au  Fact Sheet for Healthcare Providers:  IncredibleEmployment.be  This test is no t yet approved or cleared by the [place] FDA and  has been authorized for detection and/or diagnosis of SARS-CoV-2 by FDA under an Emergency Use Authorization (EUA). This EUA will remain  in effect (meaning this test can be used) for the duration of the COVID-19 declaration under Section 564(b)(1) of the Act, 21 U.S.C.section 360bbb-3(b)(1), unless the authorization is terminated  or revoked sooner.       Influenza A by PCR NEGATIVE NEGATIVE Final   Influenza B by PCR NEGATIVE NEGATIVE Final    Comment: (NOTE) The Xpert Xpress SARS-CoV-2/FLU/RSV plus assay is intended as an aid in the diagnosis of influenza from Nasopharyngeal swab specimens and should not be used as a sole basis for treatment. Nasal washings and aspirates are unacceptable for Xpert Xpress SARS-CoV-2/FLU/RSV testing.  Fact Sheet for  Patients: EntrepreneurPulse.com.au  Fact Sheet for Healthcare Providers: IncredibleEmployment.be  This test is not yet approved or cleared by the [place] FDA and has been authorized for detection and/or diagnosis of SARS-CoV-2 by FDA under an Emergency Use Authorization (EUA). This EUA will remain in effect (meaning this test can be used) for the duration of the COVID-19 declaration under Section 564(b)(1) of the Act, 21 U.S.C. section 360bbb-3(b)(1), unless the authorization is terminated or revoked.  Performed at [facility], [address], [place], [place] [address]  Labs: BNP (last 3 results) Recent Labs    12/11/20 1302  BNP 888.2*   Basic Metabolic Panel: Recent Labs  Lab 12/12/20 0641 12/12/20 2248 12/13/20 0024 12/14/20 0431 12/15/20 0443 12/16/20 0508 12/16/20 2116  NA 140  --  140 135 135 134*  --   K 3.0*   < > 3.4* 3.9 3.4* 4.5 4.0  CL 103  --  103 103 98 99  --   CO2 25  --  30 29 29 28   --   GLUCOSE 113*  --  107* 110* 103* 110*  --   BUN 28*  --  25* 28* 27* 35*  --   CREATININE 0.86  --  0.84 0.88 0.96 1.00  --   CALCIUM 8.5*  --  8.0* 7.8* 7.7* 8.0*  --   MG 1.9   < > 1.9 2.2 2.3 2.2 2.1   < > = values in this interval not displayed.   Liver Function Tests: Recent Labs  Lab 12/11/20 1302 12/12/20 0641 12/13/20 0024  AST 55* 61* 43*  ALT 21 26 22   ALKPHOS 59 64 48  BILITOT 1.2 2.0* 1.3*  PROT 6.1* 6.4* 5.6*  ALBUMIN 3.4* 3.3* 2.7*   No results for input(s): LIPASE, AMYLASE in the last 168 hours. No results for input(s): AMMONIA in the last 168 hours. CBC: Recent Labs  Lab 12/11/20 1302 12/12/20 0018 12/12/20 0641 12/12/20 1451 12/13/20 0024 12/13/20 1704 12/14/20 0431 12/15/20 0443 12/16/20 0508  WBC 12.4*  --  16.0*  --  11.2*  --  9.7 7.2 8.1  NEUTROABS 10.8*  --   --   --   --   --   --   --   --   HGB 7.6*   < > 11.7*   < > 10.5* 10.0* 9.8* 10.0* 10.2*  HCT 22.6*    < > 33.5*  33.6*   < > 30.7* 30.3* 28.4* 28.9* 29.8*  MCV 105.6*  --  95.4  --  94.8  --  98.3 98.0 99.7  PLT 184  --  189  --  145*  --  145* 147* 155   < > = values in this interval not displayed.   Cardiac Enzymes: No results for input(s): CKTOTAL, CKMB, CKMBINDEX, TROPONINI in the last 168 hours. BNP: Invalid input(s): POCBNP CBG: No results for input(s): GLUCAP in the last 168 hours. D-Dimer No results for input(s): DDIMER in the last 72 hours. Hgb A1c No results for input(s): HGBA1C in the last 72 hours. Lipid Profile No results for input(s): CHOL, HDL, LDLCALC, TRIG, CHOLHDL, LDLDIRECT in the last 72 hours. Thyroid function studies No results for input(s): TSH, T4TOTAL, T3FREE, THYROIDAB in the last 72 hours.  Invalid input(s): FREET3 Anemia work up No results for input(s): VITAMINB12, FOLATE, FERRITIN, TIBC, IRON, RETICCTPCT in the last 72 hours. Urinalysis    Component Value Date/Time   COLORURINE Straw 10/13/2011 1105   APPEARANCEUR Clear 10/13/2011 1105   LABSPEC 1.008 10/13/2011 1105   PHURINE 7.0 10/13/2011 1105   GLUCOSEU Negative 10/13/2011 1105   HGBUR Negative 10/13/2011 1105   BILIRUBINUR Negative 10/13/2011 1105   KETONESUR Negative 10/13/2011 1105   PROTEINUR Negative 10/13/2011 1105   NITRITE Negative 10/13/2011 1105   LEUKOCYTESUR Negative 10/13/2011 1105   Sepsis Labs Invalid input(s): PROCALCITONIN,  WBC,  LACTICIDVEN Microbiology Recent Results (from the past 240 hour(s))  Resp Panel by RT-PCR (Flu A&B, Covid) Nasopharyngeal Swab     Status: None  Collection Time: 12/12/20 12:46 AM   Specimen: Nasopharyngeal Swab; Nasopharyngeal(NP) swabs in vial transport medium  Result Value Ref Range Status   SARS Coronavirus 2 by RT PCR NEGATIVE NEGATIVE Final    Comment: (NOTE) SARS-CoV-2 target nucleic acids are NOT DETECTED.  The SARS-CoV-2 RNA is generally detectable in upper respiratory specimens during the acute phase of infection. The  lowest concentration of SARS-CoV-2 viral copies this assay can detect is 138 copies/mL. A negative result does not preclude SARS-Cov-2 infection and should not be used as the sole basis for treatment or other patient management decisions. A negative result may occur with  improper specimen collection/handling, submission of specimen other than nasopharyngeal swab, presence of viral mutation(s) within the areas targeted by this assay, and inadequate number of viral copies(<138 copies/mL). A negative result must be combined with clinical observations, patient history, and epidemiological information. The expected result is Negative.  Fact Sheet for Patients:  EntrepreneurPulse.com.au  Fact Sheet for Healthcare Providers:  IncredibleEmployment.be  This test is no t yet approved or cleared by the [place] FDA and  has been authorized for detection and/or diagnosis of SARS-CoV-2 by FDA under an Emergency Use Authorization (EUA). This EUA will remain  in effect (meaning this test can be used) for the duration of the COVID-19 declaration under Section 564(b)(1) of the Act, 21 U.S.C.section 360bbb-3(b)(1), unless the authorization is terminated  or revoked sooner.       Influenza A by PCR NEGATIVE NEGATIVE Final   Influenza B by PCR NEGATIVE NEGATIVE Final    Comment: (NOTE) The Xpert Xpress SARS-CoV-2/FLU/RSV plus assay is intended as an aid in the diagnosis of influenza from Nasopharyngeal swab specimens and should not be used as a sole basis for treatment. Nasal washings and aspirates are unacceptable for Xpert Xpress SARS-CoV-2/FLU/RSV testing.  Fact Sheet for Patients: EntrepreneurPulse.com.au  Fact Sheet for Healthcare Providers: IncredibleEmployment.be  This test is not yet approved or cleared by the [place] FDA and has been authorized for detection and/or diagnosis of SARS-CoV-2 by FDA under  an Emergency Use Authorization (EUA). This EUA will remain in effect (meaning this test can be used) for the duration of the COVID-19 declaration under Section 564(b)(1) of the Act, 21 U.S.C. section 360bbb-3(b)(1), unless the authorization is terminated or revoked.  Performed at [facility], [place], [place] [address]   Resp Panel by RT-PCR (Flu A&B, Covid) Nasopharyngeal Swab     Status: None   Collection Time: 12/16/20  2:15 PM   Specimen: Nasopharyngeal Swab; Nasopharyngeal(NP) swabs in vial transport medium  Result Value Ref Range Status   SARS Coronavirus 2 by RT PCR NEGATIVE NEGATIVE Final    Comment: (NOTE) SARS-CoV-2 target nucleic acids are NOT DETECTED.  The SARS-CoV-2 RNA is generally detectable in upper respiratory specimens during the acute phase of infection. The lowest concentration of SARS-CoV-2 viral copies this assay can detect is 138 copies/mL. A negative result does not preclude SARS-Cov-2 infection and should not be used as the sole basis for treatment or other patient management decisions. A negative result may occur with  improper specimen collection/handling, submission of specimen other than nasopharyngeal swab, presence of viral mutation(s) within the areas targeted by this assay, and inadequate number of viral copies(<138 copies/mL). A negative result must be combined with clinical observations, patient history, and epidemiological information. The expected result is Negative.  Fact Sheet for Patients:  EntrepreneurPulse.com.au  Fact Sheet for Healthcare Providers:  IncredibleEmployment.be  This test is no  t yet approved or cleared by the [place] and  has been authorized for detection and/or diagnosis of SARS-CoV-2 by FDA under an Emergency Use Authorization (EUA). This EUA will remain  in effect (meaning this test can be used) for the duration of the COVID-19 declaration under  Section 564(b)(1) of the Act, 21 U.S.C.section 360bbb-3(b)(1), unless the authorization is terminated  or revoked sooner.       Influenza A by PCR NEGATIVE NEGATIVE Final   Influenza B by PCR NEGATIVE NEGATIVE Final    Comment: (NOTE) The Xpert Xpress SARS-CoV-2/FLU/RSV plus assay is intended as an aid in the diagnosis of influenza from Nasopharyngeal swab specimens and should not be used as a sole basis for treatment. Nasal washings and aspirates are unacceptable for Xpert Xpress SARS-CoV-2/FLU/RSV testing.  Fact Sheet for Patients: EntrepreneurPulse.com.au  Fact Sheet for Healthcare Providers: IncredibleEmployment.be  This test is not yet approved or cleared by the [place] FDA and has been authorized for detection and/or diagnosis of SARS-CoV-2 by FDA under an Emergency Use Authorization (EUA). This EUA will remain in effect (meaning this test can be used) for the duration of the COVID-19 declaration under Section 564(b)(1) of the Act, 21 U.S.C. section 360bbb-3(b)(1), unless the authorization is terminated or revoked.  Performed at [facility], [address], [place], [place] [address]      Time coordinating discharge: Over 30 minutes  SIGNED:   Miciah Shealy J [place] DO  [facility] 12/17/2020, 9:01 AM

## 2020-12-16 NOTE — Care Management Important Message (Signed)
Important Message  Patient Details  Name: Thomas Matthews MRN: [id number] Date of Birth: 10/21/1923   Medicare Important Message Given:  Yes     [name] 12/16/2020, 3:27 PM

## 2020-12-16 NOTE — Progress Notes (Signed)
[facility] Encounter Note  Patient: Thomas Matthews / Admit Date: 12/11/2020 / Date of Encounter: 12/16/2020, 8:40 AM   Subjective: 87 y.o. male with PMH of severe AS s/p TAVR 2019, chronic combined systolic and diastolic HF EF 00%, chronic a-fib on eliquis, CAD with known LAD 80%, SSS s/p dual chamber pacer 2006 with battery change 2014, HTN, HLD, pulmonary HTN. Pt presented to the ED yesterday with concerns for family reports of "not acting right" and two falls. Patent was found to be in vtach by EMS and given amiodarone and normal saline for management with reports of continued NSVT by ED. Pt was noted to have blood in his stool at the ED with a decreased hemoglobin of 7.6 treated with transfusion of 1 unit PRBCs.     The patient has had very slow improvement of overall current symptoms since issues although has had continued recurrent evidence of longer runs of wide-complex tachycardia at very rapid rate.  This has not been controlled by metoprolol at this time.  After longer run of sustained episode yesterday reinstatement of amiodarone was appropriate.  We have not had any further episodes today  Review of Systems: Positive QQP:YPPJ Negative for: Vision change, hearing change, syncope, dizziness, nausea, vomiting,diarrhea, bloody stool, stomach pain, cough, congestion, diaphoresis, urinary frequency, urinary pain,skin lesions, skin rashes Others previously listed  Objective: Telemetry: atrial fibrillation with nonspecific conduction delay and very frequent PVCs. There have seen some episodes of nonsustained wide complex tachycardia  Physical Exam: Blood pressure (!) 147/78, pulse 86, temperature 98 F (36.7 C), temperature source Oral, resp. rate 17, height 5\' 7"  (1.702 m), weight 67.7 kg, SpO2 99 %. Body mass index is 23.38 kg/m. General: Elderly and fail appearing, in no acute distress. Head: Normocephalic, atraumatic, sclera non-icteric, no xanthomas, nares  are without discharge. Neck: No apparent masses Lungs: Normal respirations with no wheezes, no rhonchi, no rales , no crackles   Heart: irregular rate and rhythm, normal S1 S2, no murmur, no rub, no gallop, PMI is normal size and placement, carotid upstroke normal without bruit, jugular venous pressure normal Abdomen: Soft, non-tender, non-distended with normoactive bowel sounds. No hepatosplenomegaly. Abdominal aorta is normal size without bruit Extremities: No edema, no clubbing, no cyanosis, no ulcers,  Peripheral: 2+ radial, 2+ femoral, 2+ dorsal pedal pulses Neuro: Alert and oriented. Moves all extremities spontaneously. Psych:  Responds to questions appropriately with a normal affect.   Intake/Output Summary (Last 24 hours) at 12/16/2020 0840 Last data filed at 12/16/2020 0932 Gross per 24 hour  Intake 705.83 ml  Output 1200 ml  Net -494.17 ml     Inpatient Medications:   amiodarone  200 mg Oral BID   feeding supplement  237 mL Oral TID BM   ferrous sulfate  325 mg Oral BID WC   folic acid  1 mg Oral Daily   furosemide  40 mg Oral Daily   influenza vaccine adjuvanted  0.5 mL Intramuscular Tomorrow-1000   metoprolol tartrate  25 mg Oral BID   multivitamin with minerals  1 tablet Oral Daily   pantoprazole (PROTONIX) IV  40 mg Intravenous Q12H   tamsulosin  0.4 mg Oral QPC breakfast   thiamine  100 mg Oral Daily   Infusions:     Labs: Recent Labs    12/15/20 0443 12/16/20 0508  NA 135 134*  K 3.4* 4.5  CL 98 99  CO2 29 28  GLUCOSE 103* 110*  BUN 27* 35*  CREATININE 0.96 1.00  CALCIUM 7.7* 8.0*  MG 2.3 2.2    No results for input(s): AST, ALT, ALKPHOS, BILITOT, PROT, ALBUMIN in the last 72 hours.  Recent Labs    12/15/20 0443 12/16/20 0508  WBC 7.2 8.1  HGB 10.0* 10.2*  HCT 28.9* 29.8*  MCV 98.0 99.7  PLT 147* 155    No results for input(s): CKTOTAL, CKMB, TROPONINI in the last 72 hours. Invalid input(s): POCBNP No results for input(s): HGBA1C in  the last 72 hours.   Weights: Filed Weights   12/14/20 0500 12/15/20 0644 12/16/20 0651  Weight: 68.5 kg 66.1 kg 67.7 kg     Radiology/Studies:  CT Head Wo Contrast  Result Date: 12/11/2020 CLINICAL DATA:  Head trauma, minor.  Neck trauma.  Fall. EXAM: CT HEAD WITHOUT CONTRAST CT CERVICAL SPINE WITHOUT CONTRAST TECHNIQUE: Multidetector CT imaging of the head and cervical spine was performed following the standard protocol without intravenous contrast. Multiplanar CT image reconstructions of the cervical spine were also generated. COMPARISON:  None. FINDINGS: CT HEAD FINDINGS Brain: Mild for age generalized cerebral atrophy. Advanced patchy and confluent hypoattenuation within the cerebral white matter, nonspecific but compatible chronic small vessel ischemic disease. There is no acute intracranial hemorrhage. No demarcated cortical infarct. No extra-axial fluid collection. No evidence of an intracranial mass. No midline shift. Vascular: No hyperdense vessel.  Atherosclerotic calcifications. Skull: Normal. Negative for fracture or focal lesion. Sinuses/Orbits: Visualized orbits show no acute finding. Minimal mucosal thickening within the bilateral ethmoid, left sphenoid and left maxillary sinuses. Other: Trace fluid within the right mastoid air cells. Biparietal scalp soft tissue swelling/hematoma. CT CERVICAL SPINE FINDINGS Alignment: No significant spondylolisthesis. Skull base and vertebrae: The basion-dental and atlanto-dental intervals are maintained.No evidence of acute fracture to the cervical spine. Soft tissues and spinal canal: No prevertebral fluid or swelling. No visible canal hematoma. Disc levels: At C2-C3, there is early osseous fusion across the disc space and facet joint ankylosis on the right. Degenerative appearing fusion across the C5-C6 disc space. Additional cervical spondylosis with multilevel disc space narrowing, disc bulges/central disc protrusions, posterior disc osteophytes,  endplate spurring, uncovertebral hypertrophy and facet arthrosis. Disc space narrowing is advanced at C3-C4, C4-C5 and C6-C7. No appreciable severe spinal canal stenosis. Multilevel bony neural foraminal narrowing. Upper chest: No consolidation within the imaged lung apices. No visible pneumothorax. Prominent biapical emphysema and bullae/blebs. IMPRESSION: CT head: 1. No evidence of acute intracranial abnormality. 2. Advanced chronic small vessel ischemic changes within the cerebral white matter. 3. Mild for age generalized cerebral atrophy. 4. Biparietal scalp soft tissue swelling/hematoma. 5. Mild paranasal sinus mucosal thickening. 6. Trace fluid within the right mastoid air cells. CT cervical spine: 1. No evidence of acute fracture to the cervical spine. 2. Cervical spondylosis and multilevel degenerative appearing ankylosis, as described. 3.  Emphysema (ICD10-J43.9). Electronically Signed   By: Kellie Simmering D.O.   On: 12/11/2020 16:02   CT Cervical Spine Wo Contrast  Result Date: 12/11/2020 CLINICAL DATA:  Head trauma, minor.  Neck trauma.  Fall. EXAM: CT HEAD WITHOUT CONTRAST CT CERVICAL SPINE WITHOUT CONTRAST TECHNIQUE: Multidetector CT imaging of the head and cervical spine was performed following the standard protocol without intravenous contrast. Multiplanar CT image reconstructions of the cervical spine were also generated. COMPARISON:  None. FINDINGS: CT HEAD FINDINGS Brain: Mild for age generalized cerebral atrophy. Advanced patchy and confluent hypoattenuation within the cerebral white matter, nonspecific but compatible chronic small vessel ischemic disease. There is no acute intracranial hemorrhage. No demarcated cortical infarct. No  extra-axial fluid collection. No evidence of an intracranial mass. No midline shift. Vascular: No hyperdense vessel.  Atherosclerotic calcifications. Skull: Normal. Negative for fracture or focal lesion. Sinuses/Orbits: Visualized orbits show no acute finding.  Minimal mucosal thickening within the bilateral ethmoid, left sphenoid and left maxillary sinuses. Other: Trace fluid within the right mastoid air cells. Biparietal scalp soft tissue swelling/hematoma. CT CERVICAL SPINE FINDINGS Alignment: No significant spondylolisthesis. Skull base and vertebrae: The basion-dental and atlanto-dental intervals are maintained.No evidence of acute fracture to the cervical spine. Soft tissues and spinal canal: No prevertebral fluid or swelling. No visible canal hematoma. Disc levels: At C2-C3, there is early osseous fusion across the disc space and facet joint ankylosis on the right. Degenerative appearing fusion across the C5-C6 disc space. Additional cervical spondylosis with multilevel disc space narrowing, disc bulges/central disc protrusions, posterior disc osteophytes, endplate spurring, uncovertebral hypertrophy and facet arthrosis. Disc space narrowing is advanced at C3-C4, C4-C5 and C6-C7. No appreciable severe spinal canal stenosis. Multilevel bony neural foraminal narrowing. Upper chest: No consolidation within the imaged lung apices. No visible pneumothorax. Prominent biapical emphysema and bullae/blebs. IMPRESSION: CT head: 1. No evidence of acute intracranial abnormality. 2. Advanced chronic small vessel ischemic changes within the cerebral white matter. 3. Mild for age generalized cerebral atrophy. 4. Biparietal scalp soft tissue swelling/hematoma. 5. Mild paranasal sinus mucosal thickening. 6. Trace fluid within the right mastoid air cells. CT cervical spine: 1. No evidence of acute fracture to the cervical spine. 2. Cervical spondylosis and multilevel degenerative appearing ankylosis, as described. 3.  Emphysema (ICD10-J43.9). Electronically Signed   By: Kellie Simmering D.O.   On: 12/11/2020 16:02   DG Chest Portable 1 View  Result Date: 12/11/2020 CLINICAL DATA:  Brief episodes of ventricular tachycardia, slightly altered Mental status, history hypertension,  coronary artery disease EXAM: PORTABLE CHEST 1 VIEW COMPARISON:  Portable exam 1319 hours compared to 06/10/2011 FINDINGS: LEFT subclavian sequential transvenous pacemaker leads project at RIGHT atrium and RIGHT ventricle. Enlargement of cardiac silhouette post AVR. Mediastinal contours and pulmonary vascularity normal with atherosclerotic calcification of the aortic arch. Infiltrates identified throughout RIGHT lung, question asymmetric edema versus infection. Skin folds project over the lower RIGHT chest. Tiny bibasilar pleural effusions. No pneumothorax Bones demineralized. IMPRESSION: Enlargement of cardiac silhouette post TAVR pacemaker. Asymmetric interstitial infiltrates throughout RIGHT lung with tiny bibasilar effusions, Western asymmetric pulmonary edema versus atypical infection. Electronically Signed   By: Lavonia Dana M.D.   On: 12/11/2020 14:04   ECHOCARDIOGRAM COMPLETE  Result Date: 12/13/2020    ECHOCARDIOGRAM REPORT   Patient Name:   AQUILA DELAUGHTER Pettingill Date of Exam: 12/13/2020 Medical Rec #:  [id number]        Height:       67.0 in Accession #:    [id number]       Weight:       143.7 lb Date of Birth:  1923-11-22        BSA:          1.757 m Patient Age:    89 years         BP:           115/63 mmHg Patient Gender: M                HR:           86 bpm. Exam Location:  [facility] Procedure: 2D Echo, Cardiac Doppler and Color Doppler Indications:     Dyspnea R06.00  History:  Patient has no prior history of Echocardiogram examinations.                  Pacemaker. TAVR.  Sonographer:     Sherrie Sport Referring Phys:  [id number] ERIC J [place] Diagnosing Phys: Serafina Royals MD  Sonographer Comments: Suboptimal apical window. IMPRESSIONS  1. Left ventricular ejection fraction, by estimation, is 50 to 55%. The left ventricle has low normal function. The left ventricle has no regional wall motion abnormalities. Left ventricular diastolic parameters were normal.  2. Right ventricular systolic function is  normal. The right ventricular size is normal.  3. Left atrial size was moderately dilated.  4. The mitral valve is normal in structure. Mild to moderate mitral valve regurgitation.  5. The aortic valve has been repaired/replaced. Aortic valve regurgitation is not visualized. FINDINGS  Left Ventricle: Left ventricular ejection fraction, by estimation, is 50 to 55%. The left ventricle has low normal function. The left ventricle has no regional wall motion abnormalities. The left ventricular internal cavity size was normal in size. There is no left ventricular hypertrophy. Left ventricular diastolic parameters were normal. Right Ventricle: The right ventricular size is normal. No increase in right ventricular wall thickness. Right ventricular systolic function is normal. Left Atrium: Left atrial size was moderately dilated. Right Atrium: Right atrial size was normal in size. Pericardium: There is no evidence of pericardial effusion. Mitral Valve: The mitral valve is normal in structure. Mild to moderate mitral valve regurgitation. MV peak gradient, 6.0 mmHg. The mean mitral valve gradient is 2.0 mmHg. Tricuspid Valve: The tricuspid valve is normal in structure. Tricuspid valve regurgitation is mild. Aortic Valve: The aortic valve has been repaired/replaced. Aortic valve regurgitation is not visualized. Aortic valve mean gradient measures 8.7 mmHg. Aortic valve peak gradient measures 16.3 mmHg. Aortic valve area, by VTI measures 0.86 cm. There is a bioprosthetic valve present in the aortic position. Pulmonic Valve: The pulmonic valve was normal in structure. Pulmonic valve regurgitation is not visualized. Aorta: The aortic root and ascending aorta are structurally normal, with no evidence of dilitation. IAS/Shunts: No atrial level shunt detected by color flow Doppler.  LEFT VENTRICLE PLAX 2D LVIDd:         5.40 cm LVIDs:         3.90 cm LV PW:         1.50 cm LV IVS:        1.00 cm LVOT diam:     2.00 cm LV SV:          28 LV SV Index:   16 LVOT Area:     3.14 cm  RIGHT VENTRICLE RV Basal diam:  5.00 cm RV S prime:     14.00 cm/s TAPSE (M-mode): 4.0 cm LEFT ATRIUM              Index         RIGHT ATRIUM           Index LA diam:        6.10 cm  3.47 cm/m    RA Area:     45.60 cm LA Vol (A2C):   178.0 ml 101.29 ml/m  RA Volume:   209.00 ml 118.93 ml/m LA Vol (A4C):   165.0 ml 93.89 ml/m LA Biplane Vol: 187.0 ml 106.41 ml/m  AORTIC VALVE                     PULMONIC VALVE AV Area (Vmax):    0.80  cm      PV Vmax:        0.58 m/s AV Area (Vmean):   0.81 cm      PV Vmean:       40.300 cm/s AV Area (VTI):     0.86 cm      PV VTI:         0.104 m AV Vmax:           201.67 cm/s   PV Peak grad:   1.4 mmHg AV Vmean:          138.000 cm/s  PV Mean grad:   1.0 mmHg AV VTI:            0.331 m       RVOT Peak grad: 2 mmHg AV Peak Grad:      16.3 mmHg AV Mean Grad:      8.7 mmHg LVOT Vmax:         51.60 cm/s LVOT Vmean:        35.800 cm/s LVOT VTI:          0.091 m LVOT/AV VTI ratio: 0.27  AORTA Ao Root diam: 2.42 cm MITRAL VALVE                TRICUSPID VALVE MV Area (PHT): 4.54 cm     TR Peak grad:   36.7 mmHg MV Area VTI:   1.15 cm     TR Vmax:        303.00 cm/s MV Peak grad:  6.0 mmHg MV Mean grad:  2.0 mmHg     SHUNTS MV Vmax:       1.22 m/s     Systemic VTI:  0.09 m MV Vmean:      64.7 cm/s    Systemic Diam: 2.00 cm MV Decel Time: 167 msec     Pulmonic VTI:  0.092 m MV E velocity: 107.00 cm/s Serafina Royals MD Electronically signed by Serafina Royals MD Signature Date/Time: 12/13/2020/3:44:54 PM    Final      Assessment and Recommendation  87 y.o. male with PMH of severe AS s/p TAVR 2019, chronic combined systolic and diastolic HF EF 77%, chronic a-fib on eliquis, CAD with known LAD 80%, SSS s/p dual chamber pacer 2006 with battery change 2014, HTN, HLD, pulmonary HTN with concerns for several runs of wide-complex tachycardia as well as possible GI bleed on Eliquis.  Currently no evidence of acute coronary syndrome or  congestive heart failure at this time  Plan: 1.  Continue to hold Eliquis at this time pending fecal occult blood test and possible GI evaluation for possible GI bleed 2.  Continuation of metoprolol at current dose for heart rate control of atrial fibrillation and risk reduction in PVCs and ventricular tachycardia and with reinstatement of the amiodarone will continue 200 mg orally twice per day 3.  Continue current management of symptomatic anemia and GI bleed and/or other diagnostics necessary for evaluation of that issue.  Patient is at low risk for cardiac complication with endoscopy if necessary 4.  No further cardiac diagnostic testing necessary at this time  5.  Continue supportive care and treatment of pneumonia and hypoxia exacerbating above 6.  Further treatment options after above Signed, Serafina Royals, MD, [facility]

## 2020-12-16 NOTE — Progress Notes (Signed)
PROGRESS NOTE    Thomas Matthews  ZOX:[id number] DOB: 1923-07-11 DOA: 12/11/2020 PCP: Birdie Sons, MD    Brief Narrative:  Thomas Matthews is a 87 year old male with past medical history significant for essential hypertension, hyperlipidemia, BPH, GERD, iron deficiency anemia, Hx aortic stenosis s/p TAVR, cataract/macular degeneration, paroxysmal atrial fibrillation on Eliquis, CAD, SSS s/p PPM, chronic combined systolic and diastolic CHF (EF 81% TTE 19/14/78), pulmonary HTN who presented to [facility] ED on 10/26 via EMS due to confusion and multiple falls.  On EMS arrival, patient was noted to be in nonsustained ventricular tachycardia and was given 150 mg IV amiodarone and NS bolus in route to the hospital.  Also noted to be in bigeminy with recurrent runs of NSVT.  She reports large bowel movements recently but did not know if it contained any blood.  In the ED, temperature 97.4 F, HR 81, RR 16, BP 175/72, SPO2 95% on 2 L nasal cannula.  WBC 12.4, hemoglobin 7.6, platelets 184.  Sodium 140, potassium 3.4, chloride 110, CO2 24, glucose 161, BUN 36, creatinine 0.78, AST 55, ALT 21, total bilirubin 1.2.  BNP 847.4.  High-sensitivity troponin 119>147.  COVID-19 PCR negative.  Influenza A/B PCR negative.  Chest x-ray with enlargement of cardiac silhouette, noted to have replacement, asymmetric interstitial infiltrates right lung with bibasilar effusions, asymmetric pulmonary edema versus atypical infection.  CT head without contrast with no evidence of acute intracranial roundly, advanced chronic small vessel ischemic changes, generalized cerebral atrophy, biparietal scalp soft tissue swelling/hematoma, no evidence of acute fracture C-spine, multilevel degenerative changes with cervical spondylosis C-spine.   Assessment & Plan:   Active Problems:   Symptomatic anemia   GI bleeding   NSVT (nonsustained ventricular tachycardia)   Head trauma   History of transcatheter aortic valve replacement  (TAVR)   Pressure injury of skin   Protein-calorie malnutrition, severe   Acute metabolic encephalopathy, POA Patient presented via EMS after being found by family confused.  Patient is afebrile, with mildly elevated WBC count of 12.4.  Chest x-ray with findings of pulmonary edema versus atypical infection.  CT head with no acute intracranial findings.  Etiology likely multifactorial in the setting of symptomatic anemia, possible atypical pulmonary infection, CHF exacerbation, and NSVT. --Continue treatment as below  Symptomatic anemia Hx IDA Concern for GI bleed Patient presenting with hemoglobin 7.6.  History of iron deficiency anemia on home ferrous sulfate.  Anemia panel with iron 39, TIBC 325 and ferritin 64. FOBT positive. S/p 2u pRBC 10/26-27. Hgb 7.6>10.9>11.7>11.9>11.4>10.5>10.0>9.8>10.0>10.2 --Protonix 40 mg IV q12h --IV iron daily x 2; followed by ferrous sulfate 325mg  BID --CBC daily; transfuse for hemoglobin less than 8.0 given cardiac issues --Given stability of hemoglobin and comorbidities, advanced age and cardiac issues, not a optimal candidate for aggressive treatment/endoscopic procedures.  Continue conservative care at this time unless decompensation.  Nonsustained ventricular tachycardia Upon EMS arrival, patient was noted to be in ventricular tachycardia, received 150 mg IV bolus of amiodarone.  On telemetry patient continues to have intermittent NSVT; with sustained event greater than 2 minutes this morning. --Cardiology following, appreciate assistance --Metoprolol tartrate 25 mg p.o. twice daily --Starting amiodarone 200 mg p.o. twice daily --Maintain K >4.0 and Mg >2.0 --Continue monitor on telemetry; transfer to cardiac progressive unit today  Community-acquired pneumonia Chest x-ray with findings of asymmetric interstitial infiltrates right lung.  Evaluated by speech therapy on 10/29, nations of a mechanical soft diet with thin liquids with standard aspiration  precautions.  Completed 5-day  course of azithromycin and ceftriaxone. --WBC 16.0>11.2>7.2>8.2 --Continue supplemental oxygen, maintain SPO2 > 92%, on 2L [place] with SPO2 99% at rest  Elevated troponin High sensitive troponin elevated on admission, 114>147; etiology likely secondary to demand type II ischemia from NSVT, anemia and CHF exacerbation.  Currently reports chest pain-free. --Continue monitor on telemetry  Acute hypoxic respiratory failure, POA Acute on chronic combined systolic/diastolic congestive heart failure Patient notably hypoxic via EMS, placed on nasal cannula.  Became more short of breath requiring increased oxygen demand following transfusion IV fluid hydration on admission.  Chest x-ray notable for pulmonary edema versus asymmetric infection with bilateral pleural effusions.  BNP elevated.  TTE LVEF 50-55%, LV with no regional wall motion normalities, LA moderately dilated, mild-moderate MR.   --net negative 254mL past 24h and net negative 3.2L since admission --wt 71.8>66.7>65.2>68.5>66.1kg --Furosemide 40 mg PO daily --Continue supplemental oxygen, maintain SPO2 > 92%, on 2L [place] (not on home O2) --Strict I's and O's and daily weights  Paroxysmal atrial fibrillation --Home Eliquis discontinued as above due to GI bleed/symptomatic anemia --Amiodarone 200mg  PO BID --Metoprolol tartrate 25 mg p.o. twice daily --Continue monitor on telemetry  Hypokalemia Hypomagnesemia Potassium 3.6 and Magnesium 2.3 this morning; will replace potassium today --Follow electrolytes daily  Essential hypertension --Discontinued home captopril due to borderline hypotension --Diltiazem 30 mg p.o. twice daily --Hydralazine 25 mg PO q6h prn SBP >170 or DBP >110  BPH: Tamsulosin 0.4 mg p.o. daily  GERD: On omeprazole at home. --Protonix 40mg  IV q12h as above  Hx aortic stenosis s/p TAVR --Outpatient follow-up with cardiology   DVT prophylaxis: Place and maintain sequential compression  device Start: 12/12/20 1410Place and maintain sequential compression device Start: 12/12/20 1410   Code Status: DNR Family Communication: No family present at bedside this morning, Neoma Laming, patient's daughter updated extensively yesterday.  Disposition Plan:  Level of care: Progressive Cardiac Status is: Inpatient  Remains inpatient appropriate because: Continue monitoring of arrhythmia, nonsustained ventricular tachycardia on multiple occasions, starting amiodarone, further monitoring of hemoglobin, IV diuresis, needs further titration of oxygen, pending placement.   Consultants:  Cardiology  Procedures:  None  Antimicrobials:  Azithromycin Ceftriaxone   Subjective: Patient seen examined at bedside, resting comfortably.  Sleeping but easily arousable.  No specific complaints this morning.  No further NSVT events overnight.  Denies headache, no chest pain, no shortness of breath, no cough/congestion, no nausea/vomiting/diarrhea, no chest pain, no palpitations, no abdominal pain.  No acute events overnight per nursing staff.  Objective: Vitals:   12/16/20 0629 12/16/20 0651 12/16/20 0734 12/16/20 1313  BP: (!) 149/73  (!) 147/78   Pulse: 82  86   Resp:   17   Temp:   98 F (36.7 C)   TempSrc:   Oral   SpO2: 99%  99% 98%  Weight:  67.7 kg    Height:        Intake/Output Summary (Last 24 hours) at 12/16/2020 1328 Last data filed at 12/16/2020 1118 Gross per 24 hour  Intake 465.83 ml  Output 1600 ml  Net -1134.17 ml   Filed Weights   12/14/20 0500 12/15/20 0644 12/16/20 0651  Weight: 68.5 kg 66.1 kg 67.7 kg    Examination:  General exam: Appears calm and comfortable, chronically ill in appearance Respiratory system: Decreased breath sounds bilateral bases, mild crackles, normal respiratory effort without accessory muscle use, on 2 L [place] with SPO2 99% at rest Cardiovascular system: S1 & S2 heard, irregularly irregular rhythm, normal rate. No JVD, murmurs,  rubs,  gallops or clicks. No pedal edema. Gastrointestinal system: Abdomen is nondistended, soft and nontender. No organomegaly or masses felt. Normal bowel sounds heard. Central nervous system: Alert, oriented to place/time but not person. No focal neurological deficits. Extremities: Symmetric 5 x 5 power.  Moving all extremities independently Skin: No rashes, lesions or ulcers Psychiatry: Judgement and insight appear poor. Mood & affect appropriate.     Data Reviewed: I have personally reviewed following labs and imaging studies  CBC: Recent Labs  Lab 12/11/20 1302 12/12/20 0018 12/12/20 0641 12/12/20 1451 12/13/20 0024 12/13/20 1704 12/14/20 0431 12/15/20 0443 12/16/20 0508  WBC 12.4*  --  16.0*  --  11.2*  --  9.7 7.2 8.1  NEUTROABS 10.8*  --   --   --   --   --   --   --   --   HGB 7.6*   < > 11.7*   < > 10.5* 10.0* 9.8* 10.0* 10.2*  HCT 22.6*   < > 33.5*  33.6*   < > 30.7* 30.3* 28.4* 28.9* 29.8*  MCV 105.6*  --  95.4  --  94.8  --  98.3 98.0 99.7  PLT 184  --  189  --  145*  --  145* 147* 155   < > = values in this interval not displayed.   Basic Metabolic Panel: Recent Labs  Lab 12/12/20 0641 12/12/20 2248 12/13/20 0024 12/14/20 0431 12/15/20 0443 12/16/20 0508  NA 140  --  140 135 135 134*  K 3.0* 3.3* 3.4* 3.9 3.4* 4.5  CL 103  --  103 103 98 99  CO2 25  --  30 29 29 28   GLUCOSE 113*  --  107* 110* 103* 110*  BUN 28*  --  25* 28* 27* 35*  CREATININE 0.86  --  0.84 0.88 0.96 1.00  CALCIUM 8.5*  --  8.0* 7.8* 7.7* 8.0*  MG 1.9 2.0 1.9 2.2 2.3 2.2   GFR: Estimated Creatinine Clearance: 41.3 mL/min (by C-G formula based on SCr of 1 mg/dL). Liver Function Tests: Recent Labs  Lab 12/11/20 1302 12/12/20 0641 12/13/20 0024  AST 55* 61* 43*  ALT 21 26 22   ALKPHOS 59 64 48  BILITOT 1.2 2.0* 1.3*  PROT 6.1* 6.4* 5.6*  ALBUMIN 3.4* 3.3* 2.7*   No results for input(s): LIPASE, AMYLASE in the last 168 hours. No results for input(s): AMMONIA in the last 168  hours. Coagulation Profile: Recent Labs  Lab 12/12/20 0641  INR 1.3*   Cardiac Enzymes: No results for input(s): CKTOTAL, CKMB, CKMBINDEX, TROPONINI in the last 168 hours. BNP (last 3 results) No results for input(s): PROBNP in the last 8760 hours. HbA1C: No results for input(s): HGBA1C in the last 72 hours. CBG: No results for input(s): GLUCAP in the last 168 hours. Lipid Profile: No results for input(s): CHOL, HDL, LDLCALC, TRIG, CHOLHDL, LDLDIRECT in the last 72 hours. Thyroid Function Tests: No results for input(s): TSH, T4TOTAL, FREET4, T3FREE, THYROIDAB in the last 72 hours. Anemia Panel: No results for input(s): VITAMINB12, FOLATE, FERRITIN, TIBC, IRON, RETICCTPCT in the last 72 hours.  Sepsis Labs: Recent Labs  Lab 12/11/20 1539  PROCALCITON <0.10    Recent Results (from the past 240 hour(s))  Resp Panel by RT-PCR (Flu A&B, Covid) Nasopharyngeal Swab     Status: None   Collection Time: 12/12/20 12:46 AM   Specimen: Nasopharyngeal Swab; Nasopharyngeal(NP) swabs in vial transport medium  Result Value Ref Range Status  SARS Coronavirus 2 by RT PCR NEGATIVE NEGATIVE Final    Comment: (NOTE) SARS-CoV-2 target nucleic acids are NOT DETECTED.  The SARS-CoV-2 RNA is generally detectable in upper respiratory specimens during the acute phase of infection. The lowest concentration of SARS-CoV-2 viral copies this assay can detect is 138 copies/mL. A negative result does not preclude SARS-Cov-2 infection and should not be used as the sole basis for treatment or other patient management decisions. A negative result may occur with  improper specimen collection/handling, submission of specimen other than nasopharyngeal swab, presence of viral mutation(s) within the areas targeted by this assay, and inadequate number of viral copies(<138 copies/mL). A negative result must be combined with clinical observations, patient history, and epidemiological information. The expected  result is Negative.  Fact Sheet for Patients:  EntrepreneurPulse.com.au  Fact Sheet for Healthcare Providers:  IncredibleEmployment.be  This test is no t yet approved or cleared by the [place] FDA and  has been authorized for detection and/or diagnosis of SARS-CoV-2 by FDA under an Emergency Use Authorization (EUA). This EUA will remain  in effect (meaning this test can be used) for the duration of the COVID-19 declaration under Section 564(b)(1) of the Act, 21 U.S.C.section 360bbb-3(b)(1), unless the authorization is terminated  or revoked sooner.       Influenza A by PCR NEGATIVE NEGATIVE Final   Influenza B by PCR NEGATIVE NEGATIVE Final    Comment: (NOTE) The Xpert Xpress SARS-CoV-2/FLU/RSV plus assay is intended as an aid in the diagnosis of influenza from Nasopharyngeal swab specimens and should not be used as a sole basis for treatment. Nasal washings and aspirates are unacceptable for Xpert Xpress SARS-CoV-2/FLU/RSV testing.  Fact Sheet for Patients: EntrepreneurPulse.com.au  Fact Sheet for Healthcare Providers: IncredibleEmployment.be  This test is not yet approved or cleared by the [place] FDA and has been authorized for detection and/or diagnosis of SARS-CoV-2 by FDA under an Emergency Use Authorization (EUA). This EUA will remain in effect (meaning this test can be used) for the duration of the COVID-19 declaration under Section 564(b)(1) of the Act, 21 U.S.C. section 360bbb-3(b)(1), unless the authorization is terminated or revoked.  Performed at [facility], [address], [place], [place] [address]          Radiology Studies: No results found.      Scheduled Meds:  amiodarone  200 mg Oral BID   feeding supplement  237 mL Oral TID BM   ferrous sulfate  325 mg Oral BID WC   folic acid  1 mg Oral Daily   furosemide  40 mg Oral Daily   influenza  vaccine adjuvanted  0.5 mL Intramuscular Tomorrow-1000   metoprolol tartrate  25 mg Oral BID   multivitamin with minerals  1 tablet Oral Daily   pantoprazole (PROTONIX) IV  40 mg Intravenous Q12H   tamsulosin  0.4 mg Oral QPC breakfast   thiamine  100 mg Oral Daily   Continuous Infusions:     LOS: 5 days    Time spent: 39 minutes spent on chart review, discussion with nursing staff, consultants, updating family and interview/physical exam; more than 50% of that time was spent in counseling and/or coordination of care.    Dustan Hyams J [place] DO [facility] Available via Epic secure chat 7am-7pm After these hours, please refer to coverage provider listed on [email or web address] 12/16/2020, 1:28 PM

## 2020-12-16 NOTE — Progress Notes (Signed)
Occupational Therapy Treatment Patient Details Name: Thomas Matthews MRN: [id number] DOB: 06/26/1923 Today's Date: 12/16/2020   History of present illness PT is a 87 year old male who was brought to ED by his son for AMS and 2 falls in the home 36 hours prior to admission. Pt found to have low hemoglobin and transfused. 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   OT comments  Chart reviewed, Rn cleared pt for participation in OT tx session. New OT orders received as pt was transferred to a higher level of care. Tx session targeted progressing strength, endurance, functional mobility all in preparation and during ADL tasks. Pt able to tolerate transfer to bedside chair with STS with MIN A, SPT with RW with MIN A, vss throughout. Increased independence noted during ADL tasks. Pt continues to preform below PLOF, Goals remain appropriate. Continue to recommend discharge to STR to address functional deficits and improve safety and independence in ADL/IADL completion. Pt left in bedside chair, all needs met, daughter present. OT will continue to follow.   Recommendations for follow up therapy are one component of a multi-disciplinary discharge planning process, led by the attending physician.  Recommendations may be updated based on patient status, additional functional criteria and insurance authorization.    Follow Up Recommendations  Skilled nursing-short term rehab (<3 hours/day)    Assistance Recommended at Discharge Frequent or constant Supervision/Assistance  Equipment Recommendations  [facility]    Recommendations for Other Services      Precautions / Restrictions Precautions Precautions: Fall Precaution Comments: monitor O2 sats Restrictions Weight Bearing Restrictions: No       Mobility Bed Mobility Overal bed mobility: Needs Assistance Bed Mobility: Supine to Sit;Sit to Supine     Supine to sit: Min guard Sit to supine: Min guard        Transfers Overall transfer level:  Needs assistance Equipment used: Rolling walker (2 wheels) Transfers: Sit to/from [facility] Sit to Stand: Min assist;From elevated surface Stand pivot transfers: Min assist               Balance Overall balance assessment: Needs assistance;History of Falls Sitting-balance support: Feet supported Sitting balance-Leahy Scale: Fair Sitting balance - Comments: supervision-CGA after sustained sitting due to fatigue for approx 7 minutes Postural control: Posterior lean Standing balance support: Reliant on assistive device for balance;During functional activity Standing balance-Leahy Scale: Fair                             ADL either performed or assessed with clinical judgement   ADL                                         General ADL Comments: CGA at EOB for grooming tasks, MIN A for UB bathing, MIN A for UB dressing; STS with MIN A for RW, SPT with MIN A to bedsdie chair with RW     Vision Baseline Vision/History: 6 Macular Degeneration     Perception     Praxis      Cognition Arousal/Alertness: Awake/alert Behavior During Therapy: WFL for tasks assessed/performed Overall Cognitive Status: Within Functional Limits for tasks assessed                                 General Comments: [facility]  Exercises Other Exercises Other Exercises: Educated pt and daugther on transfer techniques, ADL completion, safe functional mobility   Shoulder Instructions       General Comments Spo2 95-100 at rest and during all mobility.    Pertinent Vitals/ Pain       Pain Assessment: No/denies pain   Frequency  Min 2X/week        Progress Toward Goals  OT Goals(current goals can now be found in the care plan section)  Progress towards OT goals: Progressing toward goals  Acute Rehab OT Goals Patient Stated Goal: to go home OT Goal Formulation: With patient/family Time For Goal Achievement:  12/24/20 Potential to Achieve Goals: Good  Plan Discharge plan remains appropriate;Frequency remains appropriate       AM-PAC OT "6 Clicks" Daily Activity     Outcome Measure   Help from another person eating meals?: None Help from another person taking care of personal grooming?: A Little Help from another person toileting, which includes using toliet, bedpan, or urinal?: A Lot Help from another person bathing (including washing, rinsing, drying)?: A Lot Help from another person to put on and taking off regular upper body clothing?: A Little Help from another person to put on and taking off regular lower body clothing?: A Little 6 Click Score: 17    End of Session Equipment Utilized During Treatment: Oxygen;Rolling walker (2 wheels)  OT Visit Diagnosis: Other abnormalities of gait and mobility (R26.89);Muscle weakness (generalized) (M62.81)   Activity Tolerance Patient tolerated treatment well   Patient Left in chair;with call bell/phone within reach;with family/visitor present;with chair alarm set   Nurse Communication Mobility status        Time: 3967-2897 OT Time Calculation (min): 36 min  Charges: OT General Charges $OT Visit: 1 Visit OT Treatments $Self Care/Home Management : 23-37 mins  Shanon Payor, OTD OTR/L  12/16/20, 1:24 PM

## 2020-12-16 NOTE — TOC Progression Note (Signed)
Transition of Care [facility]) - Progression Note    Patient Details  Name: Thomas Matthews MRN: [id number] Date of Birth: February 11, 1924  Transition of Care [facility]) CM/SW [place], [place] Phone Number: 12/16/2020, 2:19 PM  Clinical Narrative:     CSW spoke with patient's daughter to provide bed offers, agreeable to [facility].   Magda Paganini at [facility] reports patient can come tomorrow. Daughter Neoma Laming and MD updated.   Expected Discharge Plan: [place] Barriers to Discharge: Continued Medical Work up  Expected Discharge Plan and Services Expected Discharge Plan: [place]   Discharge Planning Services: CM Consult Post Acute Care Choice: [place] Living arrangements for the past 2 months: Single Family Home                                       Social Determinants of Health (SDOH) Interventions    Readmission Risk Interventions No flowsheet data found.

## 2020-12-17 DIAGNOSIS — M4802 Spinal stenosis, cervical region: Secondary | ICD-10-CM | POA: Diagnosis not present

## 2020-12-17 DIAGNOSIS — R42 Dizziness and giddiness: Secondary | ICD-10-CM | POA: Diagnosis not present

## 2020-12-17 DIAGNOSIS — I495 Sick sinus syndrome: Secondary | ICD-10-CM | POA: Diagnosis present

## 2020-12-17 DIAGNOSIS — I10 Essential (primary) hypertension: Secondary | ICD-10-CM | POA: Diagnosis not present

## 2020-12-17 DIAGNOSIS — F0390 Unspecified dementia without behavioral disturbance: Secondary | ICD-10-CM | POA: Diagnosis present

## 2020-12-17 DIAGNOSIS — Z972 Presence of dental prosthetic device (complete) (partial): Secondary | ICD-10-CM | POA: Diagnosis not present

## 2020-12-17 DIAGNOSIS — I4729 Other ventricular tachycardia: Secondary | ICD-10-CM | POA: Diagnosis not present

## 2020-12-17 DIAGNOSIS — I482 Chronic atrial fibrillation, unspecified: Secondary | ICD-10-CM | POA: Diagnosis present

## 2020-12-17 DIAGNOSIS — S0990XA Unspecified injury of head, initial encounter: Secondary | ICD-10-CM | POA: Diagnosis not present

## 2020-12-17 DIAGNOSIS — K4090 Unilateral inguinal hernia, without obstruction or gangrene, not specified as recurrent: Secondary | ICD-10-CM | POA: Diagnosis not present

## 2020-12-17 DIAGNOSIS — D62 Acute posthemorrhagic anemia: Secondary | ICD-10-CM | POA: Diagnosis not present

## 2020-12-17 DIAGNOSIS — R1313 Dysphagia, pharyngeal phase: Secondary | ICD-10-CM | POA: Diagnosis not present

## 2020-12-17 DIAGNOSIS — Z888 Allergy status to other drugs, medicaments and biological substances status: Secondary | ICD-10-CM | POA: Diagnosis not present

## 2020-12-17 DIAGNOSIS — M79605 Pain in left leg: Secondary | ICD-10-CM | POA: Diagnosis not present

## 2020-12-17 DIAGNOSIS — I480 Paroxysmal atrial fibrillation: Secondary | ICD-10-CM | POA: Diagnosis not present

## 2020-12-17 DIAGNOSIS — I272 Pulmonary hypertension, unspecified: Secondary | ICD-10-CM | POA: Diagnosis present

## 2020-12-17 DIAGNOSIS — K921 Melena: Secondary | ICD-10-CM | POA: Diagnosis not present

## 2020-12-17 DIAGNOSIS — W19XXXA Unspecified fall, initial encounter: Secondary | ICD-10-CM | POA: Diagnosis not present

## 2020-12-17 DIAGNOSIS — D509 Iron deficiency anemia, unspecified: Secondary | ICD-10-CM | POA: Diagnosis not present

## 2020-12-17 DIAGNOSIS — S7292XA Unspecified fracture of left femur, initial encounter for closed fracture: Secondary | ICD-10-CM | POA: Diagnosis not present

## 2020-12-17 DIAGNOSIS — I2510 Atherosclerotic heart disease of native coronary artery without angina pectoris: Secondary | ICD-10-CM | POA: Diagnosis present

## 2020-12-17 DIAGNOSIS — J90 Pleural effusion, not elsewhere classified: Secondary | ICD-10-CM | POA: Diagnosis not present

## 2020-12-17 DIAGNOSIS — G629 Polyneuropathy, unspecified: Secondary | ICD-10-CM | POA: Diagnosis not present

## 2020-12-17 DIAGNOSIS — Z9889 Other specified postprocedural states: Secondary | ICD-10-CM | POA: Diagnosis not present

## 2020-12-17 DIAGNOSIS — Z20822 Contact with and (suspected) exposure to covid-19: Secondary | ICD-10-CM | POA: Diagnosis present

## 2020-12-17 DIAGNOSIS — J439 Emphysema, unspecified: Secondary | ICD-10-CM | POA: Diagnosis not present

## 2020-12-17 DIAGNOSIS — Z66 Do not resuscitate: Secondary | ICD-10-CM | POA: Diagnosis present

## 2020-12-17 DIAGNOSIS — I110 Hypertensive heart disease with heart failure: Secondary | ICD-10-CM | POA: Diagnosis present

## 2020-12-17 DIAGNOSIS — I350 Nonrheumatic aortic (valve) stenosis: Secondary | ICD-10-CM | POA: Diagnosis present

## 2020-12-17 DIAGNOSIS — Z79899 Other long term (current) drug therapy: Secondary | ICD-10-CM | POA: Diagnosis not present

## 2020-12-17 DIAGNOSIS — I472 Ventricular tachycardia, unspecified: Secondary | ICD-10-CM | POA: Diagnosis not present

## 2020-12-17 DIAGNOSIS — S8992XA Unspecified injury of left lower leg, initial encounter: Secondary | ICD-10-CM | POA: Diagnosis not present

## 2020-12-17 DIAGNOSIS — F05 Delirium due to known physiological condition: Secondary | ICD-10-CM | POA: Diagnosis not present

## 2020-12-17 DIAGNOSIS — G9341 Metabolic encephalopathy: Secondary | ICD-10-CM | POA: Diagnosis present

## 2020-12-17 DIAGNOSIS — N3281 Overactive bladder: Secondary | ICD-10-CM | POA: Diagnosis not present

## 2020-12-17 DIAGNOSIS — K219 Gastro-esophageal reflux disease without esophagitis: Secondary | ICD-10-CM | POA: Diagnosis not present

## 2020-12-17 DIAGNOSIS — S72145A Nondisplaced intertrochanteric fracture of left femur, initial encounter for closed fracture: Secondary | ICD-10-CM | POA: Diagnosis not present

## 2020-12-17 DIAGNOSIS — Z881 Allergy status to other antibiotic agents status: Secondary | ICD-10-CM | POA: Diagnosis not present

## 2020-12-17 DIAGNOSIS — G9340 Encephalopathy, unspecified: Secondary | ICD-10-CM | POA: Diagnosis not present

## 2020-12-17 DIAGNOSIS — N400 Enlarged prostate without lower urinary tract symptoms: Secondary | ICD-10-CM | POA: Diagnosis present

## 2020-12-17 DIAGNOSIS — W010XXA Fall on same level from slipping, tripping and stumbling without subsequent striking against object, initial encounter: Secondary | ICD-10-CM | POA: Diagnosis present

## 2020-12-17 DIAGNOSIS — E43 Unspecified severe protein-calorie malnutrition: Secondary | ICD-10-CM | POA: Diagnosis not present

## 2020-12-17 DIAGNOSIS — E781 Pure hyperglyceridemia: Secondary | ICD-10-CM | POA: Diagnosis not present

## 2020-12-17 DIAGNOSIS — I517 Cardiomegaly: Secondary | ICD-10-CM | POA: Diagnosis not present

## 2020-12-17 DIAGNOSIS — M25552 Pain in left hip: Secondary | ICD-10-CM | POA: Diagnosis not present

## 2020-12-17 DIAGNOSIS — S199XXA Unspecified injury of neck, initial encounter: Secondary | ICD-10-CM | POA: Diagnosis not present

## 2020-12-17 DIAGNOSIS — Z87891 Personal history of nicotine dependence: Secondary | ICD-10-CM | POA: Diagnosis not present

## 2020-12-17 DIAGNOSIS — R001 Bradycardia, unspecified: Secondary | ICD-10-CM | POA: Diagnosis not present

## 2020-12-17 DIAGNOSIS — M47812 Spondylosis without myelopathy or radiculopathy, cervical region: Secondary | ICD-10-CM | POA: Diagnosis not present

## 2020-12-17 DIAGNOSIS — J449 Chronic obstructive pulmonary disease, unspecified: Secondary | ICD-10-CM | POA: Diagnosis present

## 2020-12-17 DIAGNOSIS — Z9181 History of falling: Secondary | ICD-10-CM | POA: Diagnosis not present

## 2020-12-17 DIAGNOSIS — T148XXA Other injury of unspecified body region, initial encounter: Secondary | ICD-10-CM | POA: Diagnosis not present

## 2020-12-17 DIAGNOSIS — I5042 Chronic combined systolic (congestive) and diastolic (congestive) heart failure: Secondary | ICD-10-CM | POA: Diagnosis not present

## 2020-12-17 DIAGNOSIS — H3530 Unspecified macular degeneration: Secondary | ICD-10-CM | POA: Diagnosis not present

## 2020-12-17 DIAGNOSIS — J189 Pneumonia, unspecified organism: Secondary | ICD-10-CM | POA: Diagnosis not present

## 2020-12-17 DIAGNOSIS — J329 Chronic sinusitis, unspecified: Secondary | ICD-10-CM | POA: Diagnosis not present

## 2020-12-17 DIAGNOSIS — D649 Anemia, unspecified: Secondary | ICD-10-CM | POA: Diagnosis not present

## 2020-12-17 DIAGNOSIS — S72142A Displaced intertrochanteric fracture of left femur, initial encounter for closed fracture: Secondary | ICD-10-CM | POA: Diagnosis present

## 2020-12-17 DIAGNOSIS — Z7901 Long term (current) use of anticoagulants: Secondary | ICD-10-CM | POA: Diagnosis not present

## 2020-12-17 DIAGNOSIS — E876 Hypokalemia: Secondary | ICD-10-CM | POA: Diagnosis not present

## 2020-12-17 DIAGNOSIS — Z950 Presence of cardiac pacemaker: Secondary | ICD-10-CM | POA: Diagnosis not present

## 2020-12-17 DIAGNOSIS — I739 Peripheral vascular disease, unspecified: Secondary | ICD-10-CM | POA: Diagnosis not present

## 2020-12-17 DIAGNOSIS — Z952 Presence of prosthetic heart valve: Secondary | ICD-10-CM | POA: Diagnosis not present

## 2020-12-17 DIAGNOSIS — I5032 Chronic diastolic (congestive) heart failure: Secondary | ICD-10-CM | POA: Diagnosis present

## 2020-12-17 DIAGNOSIS — Z8679 Personal history of other diseases of the circulatory system: Secondary | ICD-10-CM | POA: Diagnosis not present

## 2020-12-17 DIAGNOSIS — E7800 Pure hypercholesterolemia, unspecified: Secondary | ICD-10-CM | POA: Diagnosis not present

## 2020-12-17 DIAGNOSIS — Y92121 Bathroom in nursing home as the place of occurrence of the external cause: Secondary | ICD-10-CM | POA: Diagnosis not present

## 2020-12-17 MED ORDER — INFLUENZA VAC A&B SA ADJ QUAD 0.5 ML IM PRSY
0.5000 mL | PREFILLED_SYRINGE | INTRAMUSCULAR | Status: DC
  Filled 2020-12-17: qty 0.5

## 2020-12-17 MED ORDER — FERROUS SULFATE 325 (65 FE) MG PO TABS: 325.0000 mg | ORAL_TABLET | Freq: Two times a day (BID) | ORAL | 3 refills | Status: DC

## 2020-12-17 MED ORDER — FUROSEMIDE 40 MG PO TABS: 40.0000 mg | ORAL_TABLET | Freq: Every day | ORAL | Status: DC

## 2020-12-17 MED ORDER — METOPROLOL TARTRATE 25 MG PO TABS: 50.0000 mg | ORAL_TABLET | Freq: Two times a day (BID) | ORAL | 11 refills | Status: DC

## 2020-12-17 MED ORDER — AMIODARONE HCL 200 MG PO TABS: 200.0000 mg | ORAL_TABLET | Freq: Two times a day (BID) | ORAL | Status: AC

## 2020-12-17 MED ORDER — PANTOPRAZOLE SODIUM 40 MG PO TBEC: DELAYED_RELEASE_TABLET | ORAL | 1 refills | Status: DC

## 2020-12-17 NOTE — Progress Notes (Signed)
[facility] Cardiology [facility] Encounter Note  Patient: Thomas Matthews / Admit Date: 12/11/2020 / Date of Encounter: 12/17/2020, 1:08 PM   Subjective: 87 y.o. male with PMH of severe AS s/p TAVR 2019, chronic combined systolic and diastolic HF EF 09%, chronic a-fib on eliquis, CAD with known LAD 80%, SSS s/p dual chamber pacer 2006 with battery change 2014, HTN, HLD, pulmonary HTN. Pt presented to the ED yesterday with concerns for family reports of "not acting right" and two falls. Patent was found to be in vtach by EMS and given amiodarone and normal saline for management with reports of continued NSVT by ED. Pt was noted to have blood in his stool at the ED with a decreased hemoglobin of 7.6 treated with transfusion of 1 unit PRBCs.     The patient has had very slow improvement of overall current symptoms since issues although has had continued recurrent evidence of longer runs of wide-complex tachycardia at very rapid rate.  This has not been controlled by metoprolol at this time.  After longer run of sustained episode 10/30 reinstatement of amiodarone was appropriate.  We have not had any further episodes  Patient continues to feel well today with no complaints of chest pain, shortness of breath, lower extremity edema.  Review of Systems: Positive OBS:JGGE Negative for: Vision change, hearing change, syncope, dizziness, nausea, vomiting,diarrhea, bloody stool, stomach pain, cough, congestion, diaphoresis, urinary frequency, urinary pain,skin lesions, skin rashes Others previously listed  Objective: Telemetry: atrial fibrillation with nonspecific conduction delay and very frequent PVCs. There have seen some episodes of nonsustained wide complex tachycardia  Physical Exam: Blood pressure (!) 147/92, pulse 90, temperature 97.6 F (36.4 C), resp. rate 18, height 5\' 7"  (1.702 m), weight 71 kg, SpO2 95 %. Body mass index is 24.52 kg/m. General: Elderly and fail appearing, in no acute  distress. Head: Normocephalic, atraumatic, sclera non-icteric, no xanthomas, nares are without discharge. Neck: No apparent masses Lungs: Normal respirations with no wheezes, no rhonchi, no rales , no crackles   Heart: irregular rate and rhythm, normal S1 S2, no murmur, no rub, no gallop, PMI is normal size and placement, carotid upstroke normal without bruit, jugular venous pressure normal Abdomen: Soft, non-tender, non-distended with normoactive bowel sounds. No hepatosplenomegaly. Abdominal aorta is normal size without bruit Extremities: No edema, no clubbing, no cyanosis, no ulcers,  Peripheral: 2+ radial, 2+ femoral, 2+ dorsal pedal pulses Neuro: Alert and oriented. Moves all extremities spontaneously. Psych:  Responds to questions appropriately with a normal affect.   Intake/Output Summary (Last 24 hours) at 12/17/2020 1308 Last data filed at 12/17/2020 1002 Gross per 24 hour  Intake 1680 ml  Output 2900 ml  Net -1220 ml    Inpatient Medications:   amiodarone  200 mg Oral BID   feeding supplement  237 mL Oral TID BM   ferrous sulfate  325 mg Oral BID WC   folic acid  1 mg Oral Daily   furosemide  40 mg Oral Daily   influenza vaccine adjuvanted  0.5 mL Intramuscular NOW   metoprolol tartrate  2.5 mg Intravenous Once   metoprolol tartrate  25 mg Oral BID   multivitamin with minerals  1 tablet Oral Daily   pantoprazole (PROTONIX) IV  40 mg Intravenous Q12H   tamsulosin  0.4 mg Oral QPC breakfast   thiamine  100 mg Oral Daily   Infusions:     Labs: Recent Labs    12/15/20 0443 12/16/20 0508 12/16/20 2116  NA 135  134*  --   K 3.4* 4.5 4.0  CL 98 99  --   CO2 29 28  --   GLUCOSE 103* 110*  --   BUN 27* 35*  --   CREATININE 0.96 1.00  --   CALCIUM 7.7* 8.0*  --   MG 2.3 2.2 2.1   No results for input(s): AST, ALT, ALKPHOS, BILITOT, PROT, ALBUMIN in the last 72 hours.  Recent Labs    12/15/20 0443 12/16/20 0508  WBC 7.2 8.1  HGB 10.0* 10.2*  HCT 28.9* 29.8*   MCV 98.0 99.7  PLT 147* 155   No results for input(s): CKTOTAL, CKMB, TROPONINI in the last 72 hours. Invalid input(s): POCBNP No results for input(s): HGBA1C in the last 72 hours.   Weights: Filed Weights   12/15/20 0644 12/16/20 0651 12/17/20 0600  Weight: 66.1 kg 67.7 kg 71 kg     Radiology/Studies:  CT Head Wo Contrast  Result Date: 12/11/2020 CLINICAL DATA:  Head trauma, minor.  Neck trauma.  Fall. EXAM: CT HEAD WITHOUT CONTRAST CT CERVICAL SPINE WITHOUT CONTRAST TECHNIQUE: Multidetector CT imaging of the head and cervical spine was performed following the standard protocol without intravenous contrast. Multiplanar CT image reconstructions of the cervical spine were also generated. COMPARISON:  None. FINDINGS: CT HEAD FINDINGS Brain: Mild for age generalized cerebral atrophy. Advanced patchy and confluent hypoattenuation within the cerebral white matter, nonspecific but compatible chronic small vessel ischemic disease. There is no acute intracranial hemorrhage. No demarcated cortical infarct. No extra-axial fluid collection. No evidence of an intracranial mass. No midline shift. Vascular: No hyperdense vessel.  Atherosclerotic calcifications. Skull: Normal. Negative for fracture or focal lesion. Sinuses/Orbits: Visualized orbits show no acute finding. Minimal mucosal thickening within the bilateral ethmoid, left sphenoid and left maxillary sinuses. Other: Trace fluid within the right mastoid air cells. Biparietal scalp soft tissue swelling/hematoma. CT CERVICAL SPINE FINDINGS Alignment: No significant spondylolisthesis. Skull base and vertebrae: The basion-dental and atlanto-dental intervals are maintained.No evidence of acute fracture to the cervical spine. Soft tissues and spinal canal: No prevertebral fluid or swelling. No visible canal hematoma. Disc levels: At C2-C3, there is early osseous fusion across the disc space and facet joint ankylosis on the right. Degenerative appearing fusion  across the C5-C6 disc space. Additional cervical spondylosis with multilevel disc space narrowing, disc bulges/central disc protrusions, posterior disc osteophytes, endplate spurring, uncovertebral hypertrophy and facet arthrosis. Disc space narrowing is advanced at C3-C4, C4-C5 and C6-C7. No appreciable severe spinal canal stenosis. Multilevel bony neural foraminal narrowing. Upper chest: No consolidation within the imaged lung apices. No visible pneumothorax. Prominent biapical emphysema and bullae/blebs. IMPRESSION: CT head: 1. No evidence of acute intracranial abnormality. 2. Advanced chronic small vessel ischemic changes within the cerebral white matter. 3. Mild for age generalized cerebral atrophy. 4. Biparietal scalp soft tissue swelling/hematoma. 5. Mild paranasal sinus mucosal thickening. 6. Trace fluid within the right mastoid air cells. CT cervical spine: 1. No evidence of acute fracture to the cervical spine. 2. Cervical spondylosis and multilevel degenerative appearing ankylosis, as described. 3.  Emphysema (ICD10-J43.9). Electronically Signed   By: Kellie Simmering D.O.   On: 12/11/2020 16:02   CT Cervical Spine Wo Contrast  Result Date: 12/11/2020 CLINICAL DATA:  Head trauma, minor.  Neck trauma.  Fall. EXAM: CT HEAD WITHOUT CONTRAST CT CERVICAL SPINE WITHOUT CONTRAST TECHNIQUE: Multidetector CT imaging of the head and cervical spine was performed following the standard protocol without intravenous contrast. Multiplanar CT image reconstructions of the cervical  spine were also generated. COMPARISON:  None. FINDINGS: CT HEAD FINDINGS Brain: Mild for age generalized cerebral atrophy. Advanced patchy and confluent hypoattenuation within the cerebral white matter, nonspecific but compatible chronic small vessel ischemic disease. There is no acute intracranial hemorrhage. No demarcated cortical infarct. No extra-axial fluid collection. No evidence of an intracranial mass. No midline shift. Vascular: No  hyperdense vessel.  Atherosclerotic calcifications. Skull: Normal. Negative for fracture or focal lesion. Sinuses/Orbits: Visualized orbits show no acute finding. Minimal mucosal thickening within the bilateral ethmoid, left sphenoid and left maxillary sinuses. Other: Trace fluid within the right mastoid air cells. Biparietal scalp soft tissue swelling/hematoma. CT CERVICAL SPINE FINDINGS Alignment: No significant spondylolisthesis. Skull base and vertebrae: The basion-dental and atlanto-dental intervals are maintained.No evidence of acute fracture to the cervical spine. Soft tissues and spinal canal: No prevertebral fluid or swelling. No visible canal hematoma. Disc levels: At C2-C3, there is early osseous fusion across the disc space and facet joint ankylosis on the right. Degenerative appearing fusion across the C5-C6 disc space. Additional cervical spondylosis with multilevel disc space narrowing, disc bulges/central disc protrusions, posterior disc osteophytes, endplate spurring, uncovertebral hypertrophy and facet arthrosis. Disc space narrowing is advanced at C3-C4, C4-C5 and C6-C7. No appreciable severe spinal canal stenosis. Multilevel bony neural foraminal narrowing. Upper chest: No consolidation within the imaged lung apices. No visible pneumothorax. Prominent biapical emphysema and bullae/blebs. IMPRESSION: CT head: 1. No evidence of acute intracranial abnormality. 2. Advanced chronic small vessel ischemic changes within the cerebral white matter. 3. Mild for age generalized cerebral atrophy. 4. Biparietal scalp soft tissue swelling/hematoma. 5. Mild paranasal sinus mucosal thickening. 6. Trace fluid within the right mastoid air cells. CT cervical spine: 1. No evidence of acute fracture to the cervical spine. 2. Cervical spondylosis and multilevel degenerative appearing ankylosis, as described. 3.  Emphysema (ICD10-J43.9). Electronically Signed   By: Kellie Simmering D.O.   On: 12/11/2020 16:02   DG Chest  Portable 1 View  Result Date: 12/11/2020 CLINICAL DATA:  Brief episodes of ventricular tachycardia, slightly altered Mental status, history hypertension, coronary artery disease EXAM: PORTABLE CHEST 1 VIEW COMPARISON:  Portable exam 1319 hours compared to 06/10/2011 FINDINGS: LEFT subclavian sequential transvenous pacemaker leads project at RIGHT atrium and RIGHT ventricle. Enlargement of cardiac silhouette post AVR. Mediastinal contours and pulmonary vascularity normal with atherosclerotic calcification of the aortic arch. Infiltrates identified throughout RIGHT lung, question asymmetric edema versus infection. Skin folds project over the lower RIGHT chest. Tiny bibasilar pleural effusions. No pneumothorax Bones demineralized. IMPRESSION: Enlargement of cardiac silhouette post TAVR pacemaker. Asymmetric interstitial infiltrates throughout RIGHT lung with tiny bibasilar effusions, Western asymmetric pulmonary edema versus atypical infection. Electronically Signed   By: Lavonia Dana M.D.   On: 12/11/2020 14:04   ECHOCARDIOGRAM COMPLETE  Result Date: 12/13/2020    ECHOCARDIOGRAM REPORT   Patient Name:   CARTEL MAUSS Sainz Date of Exam: 12/13/2020 Medical Rec #:  [id number]        Height:       67.0 in Accession #:    [id number]       Weight:       143.7 lb Date of Birth:  05-Sep-1923        BSA:          1.757 m Patient Age:    89 years         BP:           115/63 mmHg Patient Gender: M  HR:           86 bpm. Exam Location:  [facility] Procedure: 2D Echo, Cardiac Doppler and Color Doppler Indications:     Dyspnea R06.00  History:         Patient has no prior history of Echocardiogram examinations.                  Pacemaker. TAVR.  Sonographer:     Sherrie Sport Referring Phys:  [id number] ERIC J [place] Diagnosing Phys: Serafina Royals MD  Sonographer Comments: Suboptimal apical window. IMPRESSIONS  1. Left ventricular ejection fraction, by estimation, is 50 to 55%. The left ventricle has low normal function.  The left ventricle has no regional wall motion abnormalities. Left ventricular diastolic parameters were normal.  2. Right ventricular systolic function is normal. The right ventricular size is normal.  3. Left atrial size was moderately dilated.  4. The mitral valve is normal in structure. Mild to moderate mitral valve regurgitation.  5. The aortic valve has been repaired/replaced. Aortic valve regurgitation is not visualized. FINDINGS  Left Ventricle: Left ventricular ejection fraction, by estimation, is 50 to 55%. The left ventricle has low normal function. The left ventricle has no regional wall motion abnormalities. The left ventricular internal cavity size was normal in size. There is no left ventricular hypertrophy. Left ventricular diastolic parameters were normal. Right Ventricle: The right ventricular size is normal. No increase in right ventricular wall thickness. Right ventricular systolic function is normal. Left Atrium: Left atrial size was moderately dilated. Right Atrium: Right atrial size was normal in size. Pericardium: There is no evidence of pericardial effusion. Mitral Valve: The mitral valve is normal in structure. Mild to moderate mitral valve regurgitation. MV peak gradient, 6.0 mmHg. The mean mitral valve gradient is 2.0 mmHg. Tricuspid Valve: The tricuspid valve is normal in structure. Tricuspid valve regurgitation is mild. Aortic Valve: The aortic valve has been repaired/replaced. Aortic valve regurgitation is not visualized. Aortic valve mean gradient measures 8.7 mmHg. Aortic valve peak gradient measures 16.3 mmHg. Aortic valve area, by VTI measures 0.86 cm. There is a bioprosthetic valve present in the aortic position. Pulmonic Valve: The pulmonic valve was normal in structure. Pulmonic valve regurgitation is not visualized. Aorta: The aortic root and ascending aorta are structurally normal, with no evidence of dilitation. IAS/Shunts: No atrial level shunt detected by color flow  Doppler.  LEFT VENTRICLE PLAX 2D LVIDd:         5.40 cm LVIDs:         3.90 cm LV PW:         1.50 cm LV IVS:        1.00 cm LVOT diam:     2.00 cm LV SV:         28 LV SV Index:   16 LVOT Area:     3.14 cm  RIGHT VENTRICLE RV Basal diam:  5.00 cm RV S prime:     14.00 cm/s TAPSE (M-mode): 4.0 cm LEFT ATRIUM              Index         RIGHT ATRIUM           Index LA diam:        6.10 cm  3.47 cm/m    RA Area:     45.60 cm LA Vol (A2C):   178.0 ml 101.29 ml/m  RA Volume:   209.00 ml 118.93 ml/m LA Vol (A4C):   165.0  ml 93.89 ml/m LA Biplane Vol: 187.0 ml 106.41 ml/m  AORTIC VALVE                     PULMONIC VALVE AV Area (Vmax):    0.80 cm      PV Vmax:        0.58 m/s AV Area (Vmean):   0.81 cm      PV Vmean:       40.300 cm/s AV Area (VTI):     0.86 cm      PV VTI:         0.104 m AV Vmax:           201.67 cm/s   PV Peak grad:   1.4 mmHg AV Vmean:          138.000 cm/s  PV Mean grad:   1.0 mmHg AV VTI:            0.331 m       RVOT Peak grad: 2 mmHg AV Peak Grad:      16.3 mmHg AV Mean Grad:      8.7 mmHg LVOT Vmax:         51.60 cm/s LVOT Vmean:        35.800 cm/s LVOT VTI:          0.091 m LVOT/AV VTI ratio: 0.27  AORTA Ao Root diam: 2.42 cm MITRAL VALVE                TRICUSPID VALVE MV Area (PHT): 4.54 cm     TR Peak grad:   36.7 mmHg MV Area VTI:   1.15 cm     TR Vmax:        303.00 cm/s MV Peak grad:  6.0 mmHg MV Mean grad:  2.0 mmHg     SHUNTS MV Vmax:       1.22 m/s     Systemic VTI:  0.09 m MV Vmean:      64.7 cm/s    Systemic Diam: 2.00 cm MV Decel Time: 167 msec     Pulmonic VTI:  0.092 m MV E velocity: 107.00 cm/s Serafina Royals MD Electronically signed by Serafina Royals MD Signature Date/Time: 12/13/2020/3:44:54 PM    Final      Assessment and Recommendation  87 y.o. male with PMH of severe AS s/p TAVR 2019, chronic combined systolic and diastolic HF EF 78%, chronic a-fib on eliquis, CAD with known LAD 80%, SSS s/p dual chamber pacer 2006 with battery change 2014, HTN, HLD, pulmonary  HTN with concerns for several runs of wide-complex tachycardia as well as possible GI bleed on Eliquis.  Currently no evidence of acute coronary syndrome or congestive heart failure at this time  Plan: 1.  Continue to hold Eliquis at this time due to GI bleed/symptomatic anemia.  Will reconsider Eliquis as an outpatient at future appointment. 2.  Continuation of metoprolol at current dose for heart rate control of atrial fibrillation and risk reduction in PVCs and ventricular tachycardia and with reinstatement of the amiodarone will continue 200 mg orally twice per day 4.  No further cardiac diagnostic testing necessary at this time  5.  Continue supportive care and treatment of pneumonia and hypoxia exacerbating above 6.  Patient is currently stable for discharge from a cardiovascular standpoint.  Follow-up as an outpatient in 1 to 2 weeks.   Signed, Jettie Booze PA-C

## 2020-12-17 NOTE — TOC Transition Note (Signed)
Transition of Care [facility]) - CM/SW Discharge Note   Patient Details  Name: Thomas Matthews MRN: [id number] Date of Birth: 1924/01/19  Transition of Care [facility] CM/SW Contact:  Alberteen Sam, LCSW Phone Number: 12/17/2020, 9:07 AM   Clinical Narrative:     Patient will DC to: [facility] Anticipated DC date: 12/17/20 Family notified: spouse Administrator, [employer] CB:ULAGT  Per MD patient ready for DC to [facility]. RN, patient, patient's family, and facility notified of DC. Discharge Summary sent to facility. RN given number for report  [phone] Room 408. DC packet on chart. Ambulance transport requested for patient.  CSW signing off.  Pricilla Riffle, LCSW    Final next level of care: Skilled Nursing Facility Barriers to Discharge: No Barriers Identified   Patient Goals and CMS Choice Patient states their goals for this hospitalization and ongoing recovery are:: to go home CMS Medicare.gov Compare Post Acute Care list provided to:: Patient Represenative (must comment) (spouse) Choice offered to / list presented to : Spouse  Discharge Placement                Patient to be transferred to facility by: [facility] Name of family member notified: spouse Patient and family notified of of transfer: 12/17/20  Discharge Plan and Services   Discharge Planning Services: CM Consult Post Acute Care Choice: [place]                               Social Determinants of Health (SDOH) Interventions     Readmission Risk Interventions No flowsheet data found.

## 2020-12-17 NOTE — Progress Notes (Addendum)
Per patient- has had the flu vaccine for this year.    Did not give the order dose at this time

## 2020-12-19 DIAGNOSIS — K4090 Unilateral inguinal hernia, without obstruction or gangrene, not specified as recurrent: Secondary | ICD-10-CM | POA: Diagnosis not present

## 2020-12-30 DIAGNOSIS — I10 Essential (primary) hypertension: Secondary | ICD-10-CM | POA: Diagnosis not present

## 2020-12-30 DIAGNOSIS — I472 Ventricular tachycardia, unspecified: Secondary | ICD-10-CM | POA: Diagnosis not present

## 2020-12-30 DIAGNOSIS — I480 Paroxysmal atrial fibrillation: Secondary | ICD-10-CM | POA: Diagnosis not present

## 2020-12-30 DIAGNOSIS — I482 Chronic atrial fibrillation, unspecified: Secondary | ICD-10-CM | POA: Diagnosis not present

## 2020-12-30 DIAGNOSIS — I2510 Atherosclerotic heart disease of native coronary artery without angina pectoris: Secondary | ICD-10-CM | POA: Diagnosis not present

## 2020-12-30 DIAGNOSIS — E781 Pure hyperglyceridemia: Secondary | ICD-10-CM | POA: Diagnosis not present

## 2020-12-30 DIAGNOSIS — I5042 Chronic combined systolic (congestive) and diastolic (congestive) heart failure: Secondary | ICD-10-CM | POA: Diagnosis not present

## 2020-12-30 DIAGNOSIS — I495 Sick sinus syndrome: Secondary | ICD-10-CM | POA: Diagnosis not present

## 2021-01-12 ENCOUNTER — Inpatient Hospital Stay: Payer: Medicare Other

## 2021-01-12 ENCOUNTER — Inpatient Hospital Stay
Admission: RE | Admit: 2021-01-12 | Discharge: 2021-01-15 | DRG: 480 | Disposition: A | Discharge status: Skilled Nursing Facility | Payer: Medicare Other | Source: Skilled Nursing Facility | Attending: Internal Medicine | Admitting: Internal Medicine

## 2021-01-12 ENCOUNTER — Other Ambulatory Visit: Payer: Self-pay

## 2021-01-12 ENCOUNTER — Encounter
Admission: RE | Disposition: A | Discharge status: Skilled Nursing Facility | Payer: Self-pay | Source: Skilled Nursing Facility | Attending: Internal Medicine

## 2021-01-12 ENCOUNTER — Inpatient Hospital Stay: Payer: Medicare Other | Admitting: Anesthesiology

## 2021-01-12 ENCOUNTER — Emergency Department: Payer: Medicare Other

## 2021-01-12 ENCOUNTER — Encounter: Payer: Self-pay | Admitting: Emergency Medicine

## 2021-01-12 DIAGNOSIS — I5032 Chronic diastolic (congestive) heart failure: Secondary | ICD-10-CM | POA: Diagnosis present

## 2021-01-12 DIAGNOSIS — Z66 Do not resuscitate: Secondary | ICD-10-CM | POA: Diagnosis present

## 2021-01-12 DIAGNOSIS — S7292XA Unspecified fracture of left femur, initial encounter for closed fracture: Secondary | ICD-10-CM | POA: Diagnosis not present

## 2021-01-12 DIAGNOSIS — Z8679 Personal history of other diseases of the circulatory system: Secondary | ICD-10-CM

## 2021-01-12 DIAGNOSIS — Z952 Presence of prosthetic heart valve: Secondary | ICD-10-CM

## 2021-01-12 DIAGNOSIS — I272 Pulmonary hypertension, unspecified: Secondary | ICD-10-CM | POA: Diagnosis present

## 2021-01-12 DIAGNOSIS — G9341 Metabolic encephalopathy: Secondary | ICD-10-CM | POA: Diagnosis present

## 2021-01-12 DIAGNOSIS — I10 Essential (primary) hypertension: Secondary | ICD-10-CM | POA: Diagnosis not present

## 2021-01-12 DIAGNOSIS — I495 Sick sinus syndrome: Secondary | ICD-10-CM | POA: Diagnosis present

## 2021-01-12 DIAGNOSIS — F05 Delirium due to known physiological condition: Secondary | ICD-10-CM | POA: Diagnosis not present

## 2021-01-12 DIAGNOSIS — Z87891 Personal history of nicotine dependence: Secondary | ICD-10-CM

## 2021-01-12 DIAGNOSIS — T148XXA Other injury of unspecified body region, initial encounter: Secondary | ICD-10-CM

## 2021-01-12 DIAGNOSIS — R404 Transient alteration of awareness: Secondary | ICD-10-CM | POA: Diagnosis not present

## 2021-01-12 DIAGNOSIS — I517 Cardiomegaly: Secondary | ICD-10-CM | POA: Diagnosis not present

## 2021-01-12 DIAGNOSIS — Z972 Presence of dental prosthetic device (complete) (partial): Secondary | ICD-10-CM | POA: Diagnosis not present

## 2021-01-12 DIAGNOSIS — Z9181 History of falling: Secondary | ICD-10-CM | POA: Diagnosis not present

## 2021-01-12 DIAGNOSIS — S8992XA Unspecified injury of left lower leg, initial encounter: Secondary | ICD-10-CM | POA: Diagnosis not present

## 2021-01-12 DIAGNOSIS — F0390 Unspecified dementia without behavioral disturbance: Secondary | ICD-10-CM | POA: Diagnosis present

## 2021-01-12 DIAGNOSIS — W010XXA Fall on same level from slipping, tripping and stumbling without subsequent striking against object, initial encounter: Secondary | ICD-10-CM | POA: Diagnosis present

## 2021-01-12 DIAGNOSIS — M25559 Pain in unspecified hip: Secondary | ICD-10-CM

## 2021-01-12 DIAGNOSIS — I4729 Other ventricular tachycardia: Secondary | ICD-10-CM | POA: Diagnosis not present

## 2021-01-12 DIAGNOSIS — Z888 Allergy status to other drugs, medicaments and biological substances status: Secondary | ICD-10-CM

## 2021-01-12 DIAGNOSIS — S72145A Nondisplaced intertrochanteric fracture of left femur, initial encounter for closed fracture: Secondary | ICD-10-CM | POA: Diagnosis not present

## 2021-01-12 DIAGNOSIS — G629 Polyneuropathy, unspecified: Secondary | ICD-10-CM | POA: Diagnosis not present

## 2021-01-12 DIAGNOSIS — Z7401 Bed confinement status: Secondary | ICD-10-CM | POA: Diagnosis not present

## 2021-01-12 DIAGNOSIS — Z20822 Contact with and (suspected) exposure to covid-19: Secondary | ICD-10-CM | POA: Diagnosis present

## 2021-01-12 DIAGNOSIS — S72142A Displaced intertrochanteric fracture of left femur, initial encounter for closed fracture: Principal | ICD-10-CM | POA: Diagnosis present

## 2021-01-12 DIAGNOSIS — W19XXXA Unspecified fall, initial encounter: Secondary | ICD-10-CM

## 2021-01-12 DIAGNOSIS — Z881 Allergy status to other antibiotic agents status: Secondary | ICD-10-CM

## 2021-01-12 DIAGNOSIS — R001 Bradycardia, unspecified: Secondary | ICD-10-CM | POA: Diagnosis not present

## 2021-01-12 DIAGNOSIS — J329 Chronic sinusitis, unspecified: Secondary | ICD-10-CM | POA: Diagnosis not present

## 2021-01-12 DIAGNOSIS — I739 Peripheral vascular disease, unspecified: Secondary | ICD-10-CM | POA: Diagnosis not present

## 2021-01-12 DIAGNOSIS — N400 Enlarged prostate without lower urinary tract symptoms: Secondary | ICD-10-CM | POA: Diagnosis present

## 2021-01-12 DIAGNOSIS — M4802 Spinal stenosis, cervical region: Secondary | ICD-10-CM | POA: Diagnosis not present

## 2021-01-12 DIAGNOSIS — I110 Hypertensive heart disease with heart failure: Secondary | ICD-10-CM | POA: Diagnosis present

## 2021-01-12 DIAGNOSIS — M25552 Pain in left hip: Secondary | ICD-10-CM | POA: Diagnosis not present

## 2021-01-12 DIAGNOSIS — E7800 Pure hypercholesterolemia, unspecified: Secondary | ICD-10-CM | POA: Diagnosis not present

## 2021-01-12 DIAGNOSIS — I482 Chronic atrial fibrillation, unspecified: Secondary | ICD-10-CM | POA: Diagnosis present

## 2021-01-12 DIAGNOSIS — W19XXXD Unspecified fall, subsequent encounter: Secondary | ICD-10-CM | POA: Diagnosis not present

## 2021-01-12 DIAGNOSIS — M79605 Pain in left leg: Secondary | ICD-10-CM | POA: Diagnosis not present

## 2021-01-12 DIAGNOSIS — Z79899 Other long term (current) drug therapy: Secondary | ICD-10-CM

## 2021-01-12 DIAGNOSIS — I480 Paroxysmal atrial fibrillation: Secondary | ICD-10-CM | POA: Diagnosis not present

## 2021-01-12 DIAGNOSIS — J90 Pleural effusion, not elsewhere classified: Secondary | ICD-10-CM | POA: Diagnosis not present

## 2021-01-12 DIAGNOSIS — M47812 Spondylosis without myelopathy or radiculopathy, cervical region: Secondary | ICD-10-CM | POA: Diagnosis not present

## 2021-01-12 DIAGNOSIS — S0990XA Unspecified injury of head, initial encounter: Secondary | ICD-10-CM | POA: Diagnosis not present

## 2021-01-12 DIAGNOSIS — H3530 Unspecified macular degeneration: Secondary | ICD-10-CM | POA: Diagnosis not present

## 2021-01-12 DIAGNOSIS — Z950 Presence of cardiac pacemaker: Secondary | ICD-10-CM

## 2021-01-12 DIAGNOSIS — D62 Acute posthemorrhagic anemia: Secondary | ICD-10-CM | POA: Diagnosis not present

## 2021-01-12 DIAGNOSIS — K219 Gastro-esophageal reflux disease without esophagitis: Secondary | ICD-10-CM | POA: Diagnosis not present

## 2021-01-12 DIAGNOSIS — Z9889 Other specified postprocedural states: Secondary | ICD-10-CM | POA: Diagnosis not present

## 2021-01-12 DIAGNOSIS — I350 Nonrheumatic aortic (valve) stenosis: Secondary | ICD-10-CM | POA: Diagnosis present

## 2021-01-12 DIAGNOSIS — E876 Hypokalemia: Secondary | ICD-10-CM | POA: Diagnosis not present

## 2021-01-12 DIAGNOSIS — J439 Emphysema, unspecified: Secondary | ICD-10-CM | POA: Diagnosis not present

## 2021-01-12 DIAGNOSIS — I2510 Atherosclerotic heart disease of native coronary artery without angina pectoris: Secondary | ICD-10-CM | POA: Diagnosis present

## 2021-01-12 DIAGNOSIS — N3281 Overactive bladder: Secondary | ICD-10-CM | POA: Diagnosis not present

## 2021-01-12 DIAGNOSIS — D509 Iron deficiency anemia, unspecified: Secondary | ICD-10-CM | POA: Diagnosis not present

## 2021-01-12 DIAGNOSIS — Y92121 Bathroom in nursing home as the place of occurrence of the external cause: Secondary | ICD-10-CM

## 2021-01-12 DIAGNOSIS — R1313 Dysphagia, pharyngeal phase: Secondary | ICD-10-CM | POA: Diagnosis not present

## 2021-01-12 DIAGNOSIS — E43 Unspecified severe protein-calorie malnutrition: Secondary | ICD-10-CM | POA: Diagnosis not present

## 2021-01-12 DIAGNOSIS — J449 Chronic obstructive pulmonary disease, unspecified: Secondary | ICD-10-CM | POA: Diagnosis present

## 2021-01-12 DIAGNOSIS — S199XXA Unspecified injury of neck, initial encounter: Secondary | ICD-10-CM | POA: Diagnosis not present

## 2021-01-12 DIAGNOSIS — M8580 Other specified disorders of bone density and structure, unspecified site: Secondary | ICD-10-CM | POA: Diagnosis not present

## 2021-01-12 DIAGNOSIS — I5042 Chronic combined systolic (congestive) and diastolic (congestive) heart failure: Secondary | ICD-10-CM | POA: Diagnosis not present

## 2021-01-12 DIAGNOSIS — S72142D Displaced intertrochanteric fracture of left femur, subsequent encounter for closed fracture with routine healing: Secondary | ICD-10-CM | POA: Diagnosis not present

## 2021-01-12 HISTORY — PX: INTRAMEDULLARY (IM) NAIL INTERTROCHANTERIC: SHX5875

## 2021-01-12 LAB — CBC
HCT: 37.1 % — ABNORMAL LOW (ref 39.0–52.0)
Hemoglobin: 12.4 g/dL — ABNORMAL LOW (ref 13.0–17.0)
MCH: 33.3 pg (ref 26.0–34.0)
MCHC: 33.4 g/dL (ref 30.0–36.0)
MCV: 99.7 fL (ref 80.0–100.0)
Platelets: 145 10*3/uL — ABNORMAL LOW (ref 150–400)
RBC: 3.72 MIL/uL — ABNORMAL LOW (ref 4.22–5.81)
RDW: 16.4 % — ABNORMAL HIGH (ref 11.5–15.5)
WBC: 6 10*3/uL (ref 4.0–10.5)
nRBC: 0 % (ref 0.0–0.2)

## 2021-01-12 LAB — BASIC METABOLIC PANEL
Anion gap: 7 (ref 5–15)
BUN: 15 mg/dL (ref 8–23)
CO2: 25 mmol/L (ref 22–32)
Calcium: 8.4 mg/dL — ABNORMAL LOW (ref 8.9–10.3)
Chloride: 102 mmol/L (ref 98–111)
Creatinine, Ser: 1.13 mg/dL (ref 0.61–1.24)
GFR, Estimated: 60 mL/min — ABNORMAL LOW (ref >60)
Glucose, Bld: 103 mg/dL — ABNORMAL HIGH (ref 70–99)
Potassium: 3.9 mmol/L (ref 3.5–5.1)
Sodium: 134 mmol/L — ABNORMAL LOW (ref 135–145)

## 2021-01-12 LAB — RESP PANEL BY RT-PCR (FLU A&B, COVID) ARPGX2
Influenza A by PCR: NEGATIVE
Influenza B by PCR: NEGATIVE
SARS Coronavirus 2 by RT PCR: NEGATIVE

## 2021-01-12 LAB — TYPE AND SCREEN
ABO/RH(D): O POS
Antibody Screen: NEGATIVE

## 2021-01-12 SURGERY — FIXATION, FRACTURE, INTERTROCHANTERIC, WITH INTRAMEDULLARY ROD
Anesthesia: Spinal | Site: Hip | Laterality: Left

## 2021-01-12 MED ORDER — FUROSEMIDE 40 MG PO TABS
40.0000 mg | ORAL_TABLET | Freq: Every day | ORAL | Status: DC
  Administered 2021-01-13 – 2021-01-15 (×3): 40 mg via ORAL
  Filled 2021-01-12 (×3): qty 1

## 2021-01-12 MED ORDER — PRAVASTATIN SODIUM 20 MG PO TABS
20.0000 mg | ORAL_TABLET | Freq: Every day | ORAL | Status: DC
  Administered 2021-01-13 – 2021-01-15 (×3): 20 mg via ORAL
  Filled 2021-01-12 (×3): qty 1

## 2021-01-12 MED ORDER — PANTOPRAZOLE SODIUM 40 MG PO TBEC
40.0000 mg | DELAYED_RELEASE_TABLET | Freq: Every day | ORAL | Status: DC
  Administered 2021-01-13 – 2021-01-15 (×3): 40 mg via ORAL
  Filled 2021-01-12 (×3): qty 1

## 2021-01-12 MED ORDER — METOCLOPRAMIDE HCL 5 MG/ML IJ SOLN: 5.0000 mg | Freq: Three times a day (TID) | INTRAMUSCULAR | Status: DC | PRN

## 2021-01-12 MED ORDER — PHENYLEPHRINE HCL (PRESSORS) 10 MG/ML IV SOLN
INTRAVENOUS | Status: DC | PRN
  Administered 2021-01-12 (×2): 100 ug via INTRAVENOUS

## 2021-01-12 MED ORDER — LACTATED RINGERS IV SOLN: INTRAVENOUS | Status: DC | PRN

## 2021-01-12 MED ORDER — ONDANSETRON HCL 4 MG/2ML IJ SOLN: 4.0000 mg | Freq: Four times a day (QID) | INTRAMUSCULAR | Status: DC | PRN

## 2021-01-12 MED ORDER — PROPOFOL 10 MG/ML IV BOLUS
INTRAVENOUS | Status: AC
  Filled 2021-01-12: qty 20

## 2021-01-12 MED ORDER — METHOCARBAMOL 500 MG PO TABS
500.0000 mg | ORAL_TABLET | Freq: Four times a day (QID) | ORAL | Status: DC | PRN
  Filled 2021-01-12: qty 1

## 2021-01-12 MED ORDER — OYSTER SHELL CALCIUM/D3 500-5 MG-MCG PO TABS
2.0000 | ORAL_TABLET | Freq: Every day | ORAL | Status: DC
  Administered 2021-01-13 – 2021-01-15 (×3): 2 via ORAL
  Filled 2021-01-12 (×3): qty 2

## 2021-01-12 MED ORDER — HALOPERIDOL LACTATE 5 MG/ML IJ SOLN: 1.0000 mg | Freq: Four times a day (QID) | INTRAMUSCULAR | Status: DC | PRN

## 2021-01-12 MED ORDER — HYDROCODONE-ACETAMINOPHEN 5-325 MG PO TABS
1.0000 | ORAL_TABLET | Freq: Four times a day (QID) | ORAL | Status: DC | PRN
  Administered 2021-01-14: 1 via ORAL
  Filled 2021-01-12: qty 1

## 2021-01-12 MED ORDER — METOCLOPRAMIDE HCL 10 MG PO TABS: 5.0000 mg | ORAL_TABLET | Freq: Three times a day (TID) | ORAL | Status: DC | PRN

## 2021-01-12 MED ORDER — FLEET ENEMA 7-19 GM/118ML RE ENEM: 1.0000 | ENEMA | Freq: Once | RECTAL | Status: DC | PRN

## 2021-01-12 MED ORDER — FERROUS SULFATE 325 (65 FE) MG PO TABS
325.0000 mg | ORAL_TABLET | Freq: Two times a day (BID) | ORAL | Status: DC
  Administered 2021-01-13 – 2021-01-15 (×5): 325 mg via ORAL
  Filled 2021-01-12 (×6): qty 1

## 2021-01-12 MED ORDER — SENNA 8.6 MG PO TABS
1.0000 | ORAL_TABLET | Freq: Two times a day (BID) | ORAL | Status: DC
  Administered 2021-01-12 – 2021-01-15 (×6): 8.6 mg via ORAL
  Filled 2021-01-12 (×6): qty 1

## 2021-01-12 MED ORDER — OXYBUTYNIN CHLORIDE ER 10 MG PO TB24
10.0000 mg | ORAL_TABLET | Freq: Every day | ORAL | Status: DC
  Administered 2021-01-12 – 2021-01-14 (×3): 10 mg via ORAL
  Filled 2021-01-12 (×4): qty 1

## 2021-01-12 MED ORDER — ONDANSETRON HCL 4 MG PO TABS: 4.0000 mg | ORAL_TABLET | Freq: Four times a day (QID) | ORAL | Status: DC | PRN

## 2021-01-12 MED ORDER — ALBUMIN HUMAN 5 % IV SOLN: 12.5000 g | Freq: Once | INTRAVENOUS | Status: AC

## 2021-01-12 MED ORDER — METHOCARBAMOL 1000 MG/10ML IJ SOLN
500.0000 mg | Freq: Four times a day (QID) | INTRAVENOUS | Status: DC | PRN
  Filled 2021-01-12: qty 5

## 2021-01-12 MED ORDER — PHENYLEPHRINE HCL-NACL 20-0.9 MG/250ML-% IV SOLN
INTRAVENOUS | Status: DC | PRN
  Administered 2021-01-12: 50 ug/min via INTRAVENOUS

## 2021-01-12 MED ORDER — 0.9 % SODIUM CHLORIDE (POUR BTL) OPTIME
TOPICAL | Status: DC | PRN
  Administered 2021-01-12: 16:00:00 1000 mL

## 2021-01-12 MED ORDER — CEFAZOLIN SODIUM-DEXTROSE 2-4 GM/100ML-% IV SOLN
2.0000 g | Freq: Four times a day (QID) | INTRAVENOUS | Status: AC
  Administered 2021-01-12 – 2021-01-13 (×3): 2 g via INTRAVENOUS
  Filled 2021-01-12 (×3): qty 100

## 2021-01-12 MED ORDER — TAMSULOSIN HCL 0.4 MG PO CAPS
0.4000 mg | ORAL_CAPSULE | Freq: Every day | ORAL | Status: DC
  Administered 2021-01-13 – 2021-01-15 (×3): 0.4 mg via ORAL
  Filled 2021-01-12 (×3): qty 1

## 2021-01-12 MED ORDER — BUPIVACAINE IN DEXTROSE 0.75-8.25 % IT SOLN
INTRATHECAL | Status: DC | PRN
  Administered 2021-01-12: 1.4 mL via INTRATHECAL

## 2021-01-12 MED ORDER — FESOTERODINE FUMARATE ER 4 MG PO TB24
4.0000 mg | ORAL_TABLET | Freq: Every day | ORAL | Status: DC
  Administered 2021-01-13 – 2021-01-15 (×3): 4 mg via ORAL
  Filled 2021-01-12 (×4): qty 1

## 2021-01-12 MED ORDER — PROPOFOL 500 MG/50ML IV EMUL
INTRAVENOUS | Status: AC
  Filled 2021-01-12: qty 50

## 2021-01-12 MED ORDER — PROPOFOL 500 MG/50ML IV EMUL
INTRAVENOUS | Status: DC | PRN
  Administered 2021-01-12: 40 ug/kg/min via INTRAVENOUS

## 2021-01-12 MED ORDER — BUPIVACAINE-EPINEPHRINE (PF) 0.5% -1:200000 IJ SOLN
INTRAMUSCULAR | Status: DC | PRN
  Administered 2021-01-12: 30 mL via PERINEURAL

## 2021-01-12 MED ORDER — ACETAMINOPHEN 500 MG PO TABS
500.0000 mg | ORAL_TABLET | Freq: Four times a day (QID) | ORAL | Status: AC
  Administered 2021-01-12 – 2021-01-13 (×2): 500 mg via ORAL
  Filled 2021-01-12 (×3): qty 1

## 2021-01-12 MED ORDER — HYDROCODONE-ACETAMINOPHEN 7.5-325 MG PO TABS
1.0000 | ORAL_TABLET | ORAL | Status: DC | PRN
  Administered 2021-01-12: 20:00:00 1 via ORAL
  Filled 2021-01-12: qty 1

## 2021-01-12 MED ORDER — SODIUM CHLORIDE 0.9 % IV SOLN: INTRAVENOUS | Status: DC

## 2021-01-12 MED ORDER — MAGNESIUM HYDROXIDE 400 MG/5ML PO SUSP: 30.0000 mL | Freq: Every day | ORAL | Status: DC | PRN

## 2021-01-12 MED ORDER — DIPHENHYDRAMINE HCL 12.5 MG/5ML PO ELIX: 12.5000 mg | ORAL_SOLUTION | ORAL | Status: DC | PRN

## 2021-01-12 MED ORDER — CEFAZOLIN SODIUM-DEXTROSE 2-4 GM/100ML-% IV SOLN
2.0000 g | INTRAVENOUS | Status: AC
  Administered 2021-01-12: 15:00:00 2 g via INTRAVENOUS
  Filled 2021-01-12: qty 100

## 2021-01-12 MED ORDER — EPHEDRINE SULFATE 50 MG/ML IJ SOLN
INTRAMUSCULAR | Status: DC | PRN
  Administered 2021-01-12 (×2): 10 mg via INTRAVENOUS

## 2021-01-12 MED ORDER — BISACODYL 10 MG RE SUPP
10.0000 mg | Freq: Every day | RECTAL | Status: DC | PRN
  Administered 2021-01-14: 10 mg via RECTAL
  Filled 2021-01-12: qty 1

## 2021-01-12 MED ORDER — ALBUMIN HUMAN 5 % IV SOLN
INTRAVENOUS | Status: AC
  Administered 2021-01-12: 18:00:00 12.5 g via INTRAVENOUS
  Filled 2021-01-12: qty 250

## 2021-01-12 MED ORDER — AMIODARONE HCL 200 MG PO TABS
200.0000 mg | ORAL_TABLET | Freq: Two times a day (BID) | ORAL | Status: DC
  Administered 2021-01-12 – 2021-01-15 (×6): 200 mg via ORAL
  Filled 2021-01-12 (×6): qty 1

## 2021-01-12 MED ORDER — MORPHINE SULFATE (PF) 2 MG/ML IV SOLN: 0.5000 mg | INTRAVENOUS | Status: DC | PRN

## 2021-01-12 MED ORDER — METOPROLOL TARTRATE 50 MG PO TABS
50.0000 mg | ORAL_TABLET | Freq: Two times a day (BID) | ORAL | Status: DC
  Administered 2021-01-12 – 2021-01-15 (×6): 50 mg via ORAL
  Filled 2021-01-12 (×6): qty 1

## 2021-01-12 MED ORDER — FENTANYL CITRATE (PF) 250 MCG/5ML IJ SOLN
100.0000 ug | Freq: Once | INTRAMUSCULAR | Status: AC
Start: 2021-01-12 — End: 2021-01-12
  Administered 2021-01-12: 09:00:00 100 ug via INTRAVENOUS
  Filled 2021-01-12: qty 5

## 2021-01-12 MED ORDER — ACETAMINOPHEN 325 MG PO TABS
325.0000 mg | ORAL_TABLET | Freq: Four times a day (QID) | ORAL | Status: DC | PRN
  Administered 2021-01-13 – 2021-01-14 (×3): 650 mg via ORAL
  Filled 2021-01-12 (×3): qty 2

## 2021-01-12 MED ORDER — DOCUSATE SODIUM 100 MG PO CAPS
100.0000 mg | ORAL_CAPSULE | Freq: Two times a day (BID) | ORAL | Status: DC
  Administered 2021-01-12 – 2021-01-15 (×6): 100 mg via ORAL
  Filled 2021-01-12 (×6): qty 1

## 2021-01-12 MED ORDER — EPHEDRINE 5 MG/ML INJ
INTRAVENOUS | Status: AC
  Filled 2021-01-12: qty 5

## 2021-01-12 MED ORDER — ENOXAPARIN SODIUM 40 MG/0.4ML IJ SOSY
40.0000 mg | PREFILLED_SYRINGE | INTRAMUSCULAR | Status: DC
  Administered 2021-01-13 – 2021-01-14 (×2): 40 mg via SUBCUTANEOUS
  Filled 2021-01-12 (×2): qty 0.4

## 2021-01-12 MED ORDER — CEFAZOLIN SODIUM-DEXTROSE 2-4 GM/100ML-% IV SOLN
INTRAVENOUS | Status: AC
  Filled 2021-01-12: qty 100

## 2021-01-12 SURGICAL SUPPLY — 47 items
APL PRP STRL LF DISP 70% ISPRP (MISCELLANEOUS) ×2
BIT DRILL CROWE POINT TWST 4.3 (DRILL) ×1 IMPLANT
BNDG COHESIVE 4X5 TAN ST LF (GAUZE/BANDAGES/DRESSINGS) ×2 IMPLANT
BNDG COHESIVE 6X5 TAN ST LF (GAUZE/BANDAGES/DRESSINGS) ×2 IMPLANT
CHLORAPREP W/TINT 26 (MISCELLANEOUS) ×4 IMPLANT
DRAPE 3/4 80X56 (DRAPES) ×2 IMPLANT
DRAPE C-ARMOR (DRAPES) ×2 IMPLANT
DRAPE STERI IOBAN 125X83 (DRAPES) ×2 IMPLANT
DRILL CROWE POINT TWIST 4.3 (DRILL) ×2
DRSG MEPILEX SACRM 8.7X9.8 (GAUZE/BANDAGES/DRESSINGS) ×2 IMPLANT
DRSG OPSITE POSTOP 3X4 (GAUZE/BANDAGES/DRESSINGS) ×4 IMPLANT
DRSG OPSITE POSTOP 4X6 (GAUZE/BANDAGES/DRESSINGS) ×2 IMPLANT
ELECT CAUTERY BLADE 6.4 (BLADE) ×2 IMPLANT
ELECT REM PT RETURN 9FT ADLT (ELECTROSURGICAL) ×2
ELECTRODE REM PT RTRN 9FT ADLT (ELECTROSURGICAL) ×1 IMPLANT
GAUZE 4X4 16PLY ~~LOC~~+RFID DBL (SPONGE) ×2 IMPLANT
GAUZE SPONGE 4X4 12PLY STRL (GAUZE/BANDAGES/DRESSINGS) ×2 IMPLANT
GLOVE SURG ENC MOIS LTX SZ8 (GLOVE) ×4 IMPLANT
GLOVE SURG UNDER LTX SZ8 (GLOVE) ×2 IMPLANT
GOWN STRL REUS W/ TWL LRG LVL3 (GOWN DISPOSABLE) ×1 IMPLANT
GOWN STRL REUS W/ TWL XL LVL3 (GOWN DISPOSABLE) ×1 IMPLANT
GOWN STRL REUS W/TWL LRG LVL3 (GOWN DISPOSABLE) ×2
GOWN STRL REUS W/TWL XL LVL3 (GOWN DISPOSABLE) ×2
GUIDEPIN 3.2X17.5 THRD DISP (PIN) ×2 IMPLANT
GUIDEWIRE BALL NOSE 100CM (WIRE) ×2 IMPLANT
HIP FRAC NAIL LAG SCR 10.5X100 (Orthopedic Implant) ×1 IMPLANT
MANIFOLD NEPTUNE II (INSTRUMENTS) ×2 IMPLANT
MAT ABSORB  FLUID 56X50 GRAY (MISCELLANEOUS) ×1
MAT ABSORB FLUID 56X50 GRAY (MISCELLANEOUS) ×1 IMPLANT
NAIL HIP FRACT LT 130D 11X400 (Nail) ×2 IMPLANT
NEEDLE FILTER BLUNT 18X 1/2SAF (NEEDLE) ×1
NEEDLE FILTER BLUNT 18X1 1/2 (NEEDLE) ×1 IMPLANT
NEEDLE HYPO 22GX1.5 SAFETY (NEEDLE) ×2 IMPLANT
NS IRRIG 1000ML POUR BTL (IV SOLUTION) ×2 IMPLANT
PACK HIP COMPR (MISCELLANEOUS) ×2 IMPLANT
SCREW BONE CORTICAL 5.0X44 (Screw) ×2 IMPLANT
SCREW CANN THRD AFF 10.5X100 (Orthopedic Implant) ×1 IMPLANT
SPONGE T-LAP 18X18 ~~LOC~~+RFID (SPONGE) ×4 IMPLANT
STAPLER SKIN PROX 35W (STAPLE) ×2 IMPLANT
STRAP SAFETY 5IN WIDE (MISCELLANEOUS) ×2 IMPLANT
SUT VIC AB 0 CT1 36 (SUTURE) ×2 IMPLANT
SUT VIC AB 1 CT1 36 (SUTURE) ×2 IMPLANT
SUT VIC AB 2-0 CT1 (SUTURE) ×4 IMPLANT
SYR 10ML LL (SYRINGE) ×2 IMPLANT
SYR 30ML LL (SYRINGE) ×2 IMPLANT
TAPE MICROFOAM 4IN (TAPE) ×2 IMPLANT
WATER STERILE IRR 500ML POUR (IV SOLUTION) ×4 IMPLANT

## 2021-01-12 NOTE — Transfer of Care (Signed)
Immediate Anesthesia Transfer of Care Note  Patient: Thomas Matthews  Procedure(s) Performed: INTRAMEDULLARY (IM) NAIL INTERTROCHANTRIC (Left: Hip)  Patient Location: PACU  Anesthesia Type:Spinal  Level of Consciousness: awake, alert  and oriented  Airway & Oxygen Therapy: Patient Spontanous Breathing and Patient connected to face mask oxygen  Post-op Assessment: Report given to RN and Post -op Vital signs reviewed and stable  Post vital signs: Reviewed and stable  Last Vitals:  Vitals Value Taken Time  BP 94/64 01/12/21 1648  Temp    Pulse 83 01/12/21 1648  Resp 22 01/12/21 1648  SpO2 92 % 01/12/21 1648    Last Pain:  Vitals:   01/12/21 1025  TempSrc:   PainSc: 4          Complications: No notable events documented.

## 2021-01-12 NOTE — ED Triage Notes (Addendum)
Fall , tripped and fell while in the bathroom , left hip / femur pain , shortening and rotation noted.

## 2021-01-12 NOTE — H&P (Signed)
History and Physical    Thomas Matthews QPY:[id number] DOB: [birth year] DOA: 01/12/2021  PCP: Birdie Sons, MD   Patient coming from: [facility] have personally briefly reviewed patient's old medical records in [place]  Chief Complaint: Fall  HPI: Thomas Matthews is a 87 y.o. male with medical history significant for coronary artery disease, chronic diastolic dysfunction CHF with last known LVEF of 50 - 55%, chronic A. fib no longer on anticoagulation due to symptomatic anemia, history of severe aortic stenosis status post TAVR 2019, hypertension who was brought into the ER for evaluation after he fell at the skilled nursing facility where he resides. Patient states that he tripped and fell while in the bathroom landing on his left side.  He denied feeling dizzy or lightheaded prior to the fall and denied having any chest pain or shortness of breath. He denies having any nausea, no vomiting, no diarrhea, no fever, no chills, no cough, no headache, no blurred vision, no leg swelling or any focal deficit. Patient noted to have shortening and rotation of his left leg. Sodium 134, potassium 3.9, chloride 102, bicarb 25, glucose 103, BUN 15, creatinine 1.13, calcium 8.4, white count 6.0, hemoglobin 12.4, hematocrit 37.1, MCV 99.7, RDW 16.4, platelet count 145 Respiratory viral panel is negative Cervical spine CT without contrast shows no recent fracture of the cervical spine.  Cervical spondylosis with spinal stenosis and encroachment of neural foramina at multiple levels. CT scan of the head without contrast shows no acute intracranial findings.  Small vessel disease. Chest x-ray reviewed by me shows chronic cardiomegaly and trace left pleural effusion. X-ray of the left femur shows an acute intertrochanteric left femur fracture. X-ray of the pelvis shows acute intertrochanteric left femur fracture.  Generalized osteopenia.  Gas over the right groin suggesting bowel  containing hernia. Twelve-lead EKG reviewed by me shows wide QRS rhythm, LBBB, right axis deviation.  ED Course: Patient is a 87 year old male who was brought in from skilled nursing facility where he resides for evaluation following a fall.  While in the ER he had room air pulse oximetry of 88 to 89% requiring oxygen supplementation at 2 L. He has an acute intertrochanteric left femur fracture and will be admitted to the hospital for further evaluation.   Review of Systems: As per HPI otherwise all other systems reviewed and negative.    History reviewed. No pertinent past medical history.  History reviewed. No pertinent surgical history.   reports that he has quit smoking. He has never used smokeless tobacco. He reports that he does not drink alcohol and does not use drugs.  Allergies  Allergen Reactions   Amlodipine Besylate Swelling   Felodipine Swelling   Oxybutynin Chloride Other (See Comments)    Other reaction(s): Dizziness Tolerates taking at night   Simvastatin     Other reaction(s): Dizziness   Terazosin     Other reaction(s): Low blood pressure    History reviewed. No pertinent family history.  Parents are deceased   Prior to Admission medications   Medication Sig Start Date End Date Taking? Authorizing Provider  amiodarone (PACERONE) 200 MG tablet Take 1 tablet (200 mg total) by mouth 2 (two) times daily. 12/17/20   [place] Donnamarie Poag, DO  calcium citrate-vitamin D (CITRACAL+D) 315-200 MG-UNIT tablet Take 2 tablets by mouth daily.    [provider]  ferrous sulfate 325 (65 FE) MG tablet Take 1 tablet (325 mg total) by mouth 2 (two) times daily  with a meal. 12/17/20   [place] Donnamarie Poag, DO  furosemide (LASIX) 40 MG tablet Take 1 tablet (40 mg total) by mouth daily. 12/17/20   [place] Donnamarie Poag, DO  metoprolol tartrate (LOPRESSOR) 25 MG tablet Take 2 tablets (50 mg total) by mouth 2 (two) times daily. 12/17/20 12/17/21  [place] Donnamarie Poag, DO  oxybutynin (DITROPAN-XL) 10  MG 24 hr tablet Take 10 mg by mouth at bedtime.    [provider]  pantoprazole (PROTONIX) 40 MG tablet Take 1 tablet (40 mg total) by mouth 2 (two) times daily for 28 days, THEN 1 tablet (40 mg total) 2 (two) times daily. 12/17/20 04/14/21  [place] Donnamarie Poag, DO  pravastatin (PRAVACHOL) 20 MG tablet Take 20 mg by mouth daily.    [provider]  tamsulosin (FLOMAX) 0.4 MG CAPS capsule Take 0.4 mg by mouth daily after breakfast. 11/11/11   [provider]  tolterodine (DETROL LA) 4 MG 24 hr capsule Take 4 mg by mouth daily. 03/14/20   [provider]    Physical Exam: Vitals:   01/12/21 0813 01/12/21 0908 01/12/21 0915 01/12/21 0922  BP: (!) 181/109  (!) 166/74 (!) 166/74  Pulse: 62  63 62  Resp: 18   18  Temp: 97.6 F (36.4 C)     TempSrc: Oral     SpO2: 91%  96% 100%  Weight:  61.2 kg    Height:  5\' 3"  (1.6 m)       Vitals:   01/12/21 0813 01/12/21 0908 01/12/21 0915 01/12/21 0922  BP: (!) 181/109  (!) 166/74 (!) 166/74  Pulse: 62  63 62  Resp: 18   18  Temp: 97.6 F (36.4 C)     TempSrc: Oral     SpO2: 91%  96% 100%  Weight:  61.2 kg    Height:  5\' 3"  (1.6 m)        Constitutional: Alert and oriented x 2 . Not in any apparent distress HEENT:      Head: Normocephalic and atraumatic.         Eyes: PERLA, EOMI, Conjunctivae are normal. Sclera is non-icteric.       Mouth/Throat: Mucous membranes are moist.       Neck: Supple with no signs of meningismus. Cardiovascular: Regular rate and rhythm. No murmurs, gallops, or rubs. 2+ symmetrical distal pulses are present . No JVD. No LE edema Respiratory: Respiratory effort normal .Lungs sounds clear bilaterally. No wheezes, crackles, or rhonchi.  Gastrointestinal: Soft, non tender, and non distended with positive bowel sounds.  Genitourinary: No CVA tenderness. Musculoskeletal: Decreased range of motion left hip, shortening and rotation of left leg.   Neurologic:  Face is symmetric. Moving all  extremities. No gross focal neurologic deficits . Skin: Skin is warm, dry.  No rash or ulcers Psychiatric: Mood and affect are normal    Labs on Admission: I have personally reviewed following labs and imaging studies  CBC: Recent Labs  Lab 01/12/21 0817  WBC 6.0  HGB 12.4*  HCT 37.1*  MCV 99.7  PLT 989*   Basic Metabolic Panel: Recent Labs  Lab 01/12/21 0817  NA 134*  K 3.9  CL 102  CO2 25  GLUCOSE 103*  BUN 15  CREATININE 1.13  CALCIUM 8.4*   GFR: Estimated Creatinine Clearance: 31.5 mL/min (by C-G formula based on SCr of 1.13 mg/dL). Liver Function Tests: No results for input(s): AST, ALT, ALKPHOS, BILITOT, PROT, ALBUMIN in the last 168 hours. No  results for input(s): LIPASE, AMYLASE in the last 168 hours. No results for input(s): AMMONIA in the last 168 hours. Coagulation Profile: No results for input(s): INR, PROTIME in the last 168 hours. Cardiac Enzymes: No results for input(s): CKTOTAL, CKMB, CKMBINDEX, TROPONINI in the last 168 hours. BNP (last 3 results) No results for input(s): PROBNP in the last 8760 hours. HbA1C: No results for input(s): HGBA1C in the last 72 hours. CBG: No results for input(s): GLUCAP in the last 168 hours. Lipid Profile: No results for input(s): CHOL, HDL, LDLCALC, TRIG, CHOLHDL, LDLDIRECT in the last 72 hours. Thyroid Function Tests: No results for input(s): TSH, T4TOTAL, FREET4, T3FREE, THYROIDAB in the last 72 hours. Anemia Panel: No results for input(s): VITAMINB12, FOLATE, FERRITIN, TIBC, IRON, RETICCTPCT in the last 72 hours. Urine analysis:    Component Value Date/Time   COLORURINE Straw 10/13/2011 1105   APPEARANCEUR Clear 10/13/2011 1105   LABSPEC 1.008 10/13/2011 1105   PHURINE 7.0 10/13/2011 1105   GLUCOSEU Negative 10/13/2011 1105   HGBUR Negative 10/13/2011 1105   BILIRUBINUR Negative 10/13/2011 1105   KETONESUR Negative 10/13/2011 1105   PROTEINUR Negative 10/13/2011 1105   NITRITE Negative 10/13/2011 1105    LEUKOCYTESUR Negative 10/13/2011 1105    Radiological Exams on Admission: DG Pelvis 1-2 Views  Result Date: 01/12/2021 CLINICAL DATA:  Fall with hip pain EXAM: PELVIS - 1-2 VIEW COMPARISON:  None. FINDINGS: Acute intertrochanteric left femur fracture with mild varus angulation. No evidence of pelvic ring fracture or diastasis. Generalized osteopenia and atheromatous calcification. Gas over the right groin suggesting hernia. IMPRESSION: 1. Acute intertrochanteric left femur fracture. 2. Generalized osteopenia. 3. Gas over the right groin suggesting bowel containing hernia. Electronically Signed   By: Jorje Guild M.D.   On: 01/12/2021 09:00   CT Head Wo Contrast  Result Date: 01/12/2021 CLINICAL DATA:  Trauma, fall EXAM: CT HEAD WITHOUT CONTRAST TECHNIQUE: Contiguous axial images were obtained from the base of the skull through the vertex without intravenous contrast. COMPARISON:  12/11/2020 FINDINGS: Brain: No acute intracranial findings are seen. Cortical sulci are prominent. There is decreased density in subcortical and periventricular white matter. Vascular: Scattered arterial calcifications are seen. Skull: Unremarkable. Sinuses/Orbits: There is mucosal thickening in the ethmoid and left maxillary sinuses. Other: No significant interval changes are noted. IMPRESSION: No acute intracranial findings are seen. Atrophy. Small-vessel disease. Mild chronic sinusitis. Electronically Signed   By: Elmer Picker M.D.   On: 01/12/2021 09:51   CT Cervical Spine Wo Contrast  Result Date: 01/12/2021 CLINICAL DATA:  Trauma, fall EXAM: CT CERVICAL SPINE WITHOUT CONTRAST TECHNIQUE: Multidetector CT imaging of the cervical spine was performed without intravenous contrast. Multiplanar CT image reconstructions were also generated. COMPARISON:  12/11/2020 FINDINGS: Alignment: Alignment of posterior margins of vertebral bodies is essentially unremarkable. There is minimal anterolisthesis at C6-C7 level  which has not changed significantly. Skull base and vertebrae: No recent fracture is seen. There are few small smooth marginated calcifications in the soft tissues posterior to the spinous processes of lower cervical and upper thoracic spine, possibly ligament calcification from previous injury. Degenerative changes are noted with disc space narrowing, bony spurs and facet hypertrophy at multiple levels. Soft tissues and spinal canal: There is extrinsic pressure over the ventral margin of thecal sac caused by posterior bony spurs at multiple levels, more so at C6-C7 level with mild-to-moderate spinal stenosis. Disc levels: There is encroachment of neural foramina by uncovertebral spurs from C3-C7 levels. Upper chest: Blebs and bullae are seen in  the upper lung fields, more so on the right side. Other: No significant interval changes are noted. IMPRESSION: No recent fracture is seen in the cervical spine. Cervical spondylosis with spinal stenosis and encroachment of neural foramina at multiple levels. Overall, no significant interval changes are noted since 12/11/2020. COPD. Electronically Signed   By: Elmer Picker M.D.   On: 01/12/2021 09:59   DG Chest Port 1 View  Result Date: 01/12/2021 CLINICAL DATA:  Hip pain EXAM: PORTABLE CHEST 1 VIEW COMPARISON:  12/11/2020 FINDINGS: Chronic cardiomegaly with dual-chamber pacer. Transcatheter aortic valve replacement. Trace left pleural effusion. Streaky density bilaterally which is vague and favors atelectasis or scarring. IMPRESSION: 1. Chronic cardiomegaly and trace left pleural effusion. 2. Suspect mild atelectasis or scarring. Electronically Signed   By: Jorje Guild M.D.   On: 01/12/2021 08:59   DG FEMUR MIN 2 VIEWS LEFT  Result Date: 01/12/2021 CLINICAL DATA:  Fall in bathroom with hip pain EXAM: LEFT FEMUR 2 VIEWS COMPARISON:  None. FINDINGS: Acute intertrochanteric left femur fracture with mild varus angulation. Generalized osteopenia. The mid to  distal femur is intact. Atheromatous calcification. IMPRESSION: Acute intertrochanteric left femur fracture. Electronically Signed   By: Jorje Guild M.D.   On: 01/12/2021 08:56     Assessment/Plan Principal Problem:   Femur fracture, left (HCC) Active Problems:   CAD (coronary artery disease)   History of transcatheter aortic valve replacement (TAVR)   Atrial fibrillation, chronic (HCC)   Chronic diastolic CHF (congestive heart failure) (HCC)   COPD (chronic obstructive pulmonary disease) ([place])     Patient is a 87 year old male who presents to the ER for evaluation following a fall and has a left femur fracture.   Left femur fracture Following a mechanical fall Immobilize left lower extremity Pain control and muscle relaxants Fall precautions We will consult orthopedic surgery Patient is at high risk for surgery due to his age and multiple cardiac comorbidities but is in stable condition at this time.    Chronic atrial fibrillation Continue amiodarone and metoprolol for rate control Patient no longer on long-term anticoagulation due to recent GI bleed requiring blood transfusion.    History of Coronary artery disease Continue metoprolol and pravastatin    Chronic diastolic dysfunction CHF Last known 2D echocardiogram shows an LVEF of 50 to 55% Not acutely exacerbated Continue furosemide 40 mg daily Continue metoprolol     History of severe aortic valve stenosis Status post TAVR Stable   DVT prophylaxis: SCD Code Status: DO NOT RESUSCITATE Family Communication: Greater than 50% of time was spent discussing patient's condition and plan of care with his daughter Earnest Bailey over the phone.  All questions and concerns have been addressed.  She verbalizes understanding and agrees with the plan.  CODE STATUS was discussed and he is a DO NOT RESUSCITATE. Disposition Plan: SNF Consults called: Orthopedic surgery Status:At the time of admission, it appears that  the appropriate admission status for this patient is inpatient. This is judged to be reasonable and necessary to provide the required intensity of service to ensure the patient's safety given the presenting symptoms, physical exam findings, and initial radiographic and laboratory data in the context of their comorbid conditions. Patient requires inpatient status due to high intensity of service, high risk for further deterioration and high frequency of surveillance required.     Collier Bullock MD [facility]     01/12/2021, 10:31 AM

## 2021-01-12 NOTE — ED Notes (Addendum)
Pt with drop of O2 sats to 87-89% on room air after meds given, O2 sats applied, sats increased to 97-99% on 2 L [place]. EDP in room. Pt staring off and not making eye contact at times, pt still responsive and following commands.

## 2021-01-12 NOTE — OR PostOp (Incomplete)
PACU TO INPATIENT HANDOFF REPORT  Name/Age/Gender Thomas Matthews 87 y.o. male  Code Status    Code Status Orders  (From admission, onward)           Start     Ordered   01/12/21 1029  Do not attempt resuscitation (DNR)  Continuous       Question Answer Comment  In the event of cardiac or respiratory ARREST Do not call a code blue   In the event of cardiac or respiratory ARREST Do not perform Intubation, CPR, defibrillation or ACLS   In the event of cardiac or respiratory ARREST Use medication by any route, position, wound care, and other measures to relive pain and suffering. May use oxygen, suction and manual treatment of airway obstruction as needed for comfort.   Comments Verified with patient's daughter  on 01/12/2021      01/12/21 1029           Code Status History     Date Active Date Inactive Code Status Order ID Comments User Context   12/12/2020 1440 12/17/2020 1856 DNR [id number]  [place] Eric J, DO ED   12/11/2020 1637 12/12/2020 1440 Full Code [id number]  Karie Kirks, DO ED      Advance Directive Documentation    Flowsheet Row Most Recent Value  Type of Advance Directive Out of facility DNR (pink MOST or yellow form)  Pre-existing out of facility DNR order (yellow form or pink MOST form) --  "MOST" Form in Place? --       Home/SNF/Other {Discharge Destination:18313::"Home"}  Chief Complaint Fracture [T14.8XXA] Hip pain [M25.559] Fall [W19.XXXA] Femur fracture, left ([place]) [S72.92XA]  Level of Care/Admitting Diagnosis ED Disposition     ED Disposition  Admit   Condition  --   Comment  Hospital Area: [facility] [[id number]]  Level of Care: Med-Surg [16]  Covid Evaluation: Asymptomatic Screening Protocol (No Symptoms)  Diagnosis: Femur fracture, left [facility]) [[id number]]  Admitting Physician: Gary Fleet  Attending Physician: Gary Fleet  Estimated length of stay: 3 - 4 days  Certification:: I  certify this patient will need inpatient services for at least 2 midnights          Medical History History reviewed. No pertinent past medical history.  Allergies Allergies  Allergen Reactions   Amlodipine Besylate Swelling   Felodipine Swelling   Oxybutynin Chloride Other (See Comments)    Other reaction(s): Dizziness Tolerates taking at night   Simvastatin     Other reaction(s): Dizziness   Terazosin     Other reaction(s): Low blood pressure    IV Location/Drains/Wounds Patient Lines/Drains/Airways Status     Active Line/Drains/Airways     Name Placement date Placement time Site Days   Peripheral IV 01/12/21 20 G Anterior;Proximal;Right Forearm 01/12/21  0824  Forearm  less than 1   External Urinary Catheter 12/12/20  2011  --  31   Incision (Closed) 01/12/21 Hip Left 01/12/21  1618  -- less than 1   Pressure Injury 12/12/20 Coccyx Medial Stage 1 -  Intact skin with non-blanchable redness of a localized area usually over a bony prominence. 12/12/20  2011  -- 31   Wound / Incision (Open or Dehisced) Skin tear Arm Anterior;Lower;Proximal;Right;Left --  --  Arm  --            Labs/Imaging Results for orders placed or performed during the hospital encounter of 01/12/21 (from the past 48 hour(s))  Basic metabolic panel  Status: Abnormal   Collection Time: 01/12/21  8:17 AM  Result Value Ref Range   Sodium 134 (L) 135 - 145 mmol/L   Potassium 3.9 3.5 - 5.1 mmol/L   Chloride 102 98 - 111 mmol/L   CO2 25 22 - 32 mmol/L   Glucose, Bld 103 (H) 70 - 99 mg/dL    Comment: Glucose reference range applies only to samples taken after fasting for at least 8 hours.   BUN 15 8 - 23 mg/dL   Creatinine, Ser 1.13 0.61 - 1.24 mg/dL   Calcium 8.4 (L) 8.9 - 10.3 mg/dL   GFR, Estimated 60 (L) >60 mL/min    Comment: (NOTE) Calculated using the CKD-EPI Creatinine Equation (2021)    Anion gap 7 5 - 15    Comment: Performed at [facility], [address]  [place], [place] [address]  CBC     Status: Abnormal   Collection Time: 01/12/21  8:17 AM  Result Value Ref Range   WBC 6.0 4.0 - 10.5 K/uL   RBC 3.72 (L) 4.22 - 5.81 MIL/uL   Hemoglobin 12.4 (L) 13.0 - 17.0 g/dL   HCT 37.1 (L) 39.0 - 52.0 %   MCV 99.7 80.0 - 100.0 fL   MCH 33.3 26.0 - 34.0 pg   MCHC 33.4 30.0 - 36.0 g/dL   RDW 16.4 (H) 11.5 - 15.5 %   Platelets 145 (L) 150 - 400 K/uL   nRBC 0.0 0.0 - 0.2 %    Comment: Performed at [facility], [address], [place], [place] [address]  Resp Panel by RT-PCR (Flu A&B, Covid) Nasopharyngeal Swab     Status: None   Collection Time: 01/12/21  9:10 AM   Specimen: Nasopharyngeal Swab; Nasopharyngeal(NP) swabs in vial transport medium  Result Value Ref Range   SARS Coronavirus 2 by RT PCR NEGATIVE NEGATIVE    Comment: (NOTE) SARS-CoV-2 target nucleic acids are NOT DETECTED.  The SARS-CoV-2 RNA is generally detectable in upper respiratory specimens during the acute phase of infection. The lowest concentration of SARS-CoV-2 viral copies this assay can detect is 138 copies/mL. A negative result does not preclude SARS-Cov-2 infection and should not be used as the sole basis for treatment or other patient management decisions. A negative result may occur with  improper specimen collection/handling, submission of specimen other than nasopharyngeal swab, presence of viral mutation(s) within the areas targeted by this assay, and inadequate number of viral copies(<138 copies/mL). A negative result must be combined with clinical observations, patient history, and epidemiological information. The expected result is Negative.  Fact Sheet for Patients:  EntrepreneurPulse.com.au  Fact Sheet for Healthcare Providers:  IncredibleEmployment.be  This test is no t yet approved or cleared by the [place] FDA and  has been authorized for detection and/or diagnosis of SARS-CoV-2 by FDA under an Emergency  Use Authorization (EUA). This EUA will remain  in effect (meaning this test can be used) for the duration of the COVID-19 declaration under Section 564(b)(1) of the Act, 21 U.S.C.section 360bbb-3(b)(1), unless the authorization is terminated  or revoked sooner.       Influenza A by PCR NEGATIVE NEGATIVE   Influenza B by PCR NEGATIVE NEGATIVE    Comment: (NOTE) The Xpert Xpress SARS-CoV-2/FLU/RSV plus assay is intended as an aid in the diagnosis of influenza from Nasopharyngeal swab specimens and should not be used as a sole basis for treatment. Nasal washings and aspirates are unacceptable for Xpert Xpress SARS-CoV-2/FLU/RSV testing.  Fact Sheet  for Patients: EntrepreneurPulse.com.au  Fact Sheet for Healthcare Providers: IncredibleEmployment.be  This test is not yet approved or cleared by the [place] FDA and has been authorized for detection and/or diagnosis of SARS-CoV-2 by FDA under an Emergency Use Authorization (EUA). This EUA will remain in effect (meaning this test can be used) for the duration of the COVID-19 declaration under Section 564(b)(1) of the Act, 21 U.S.C. section 360bbb-3(b)(1), unless the authorization is terminated or revoked.  Performed at [facility], [address] [place], [place] [address]   Type and screen [place]     Status: None   Collection Time: 01/12/21 10:30 AM  Result Value Ref Range   ABO/RH(D) O POS    Antibody Screen NEG    Sample Expiration      01/15/2021,2359 Performed at [facility], [address] [place], [place] [address]    DG Pelvis 1-2 Views  Result Date: 01/12/2021 CLINICAL DATA:  Fall with hip pain EXAM: PELVIS - 1-2 VIEW COMPARISON:  None. FINDINGS: Acute intertrochanteric left femur fracture with mild varus angulation. No evidence of pelvic ring fracture or diastasis. Generalized osteopenia and atheromatous calcification. Gas over  the right groin suggesting hernia. IMPRESSION: 1. Acute intertrochanteric left femur fracture. 2. Generalized osteopenia. 3. Gas over the right groin suggesting bowel containing hernia. Electronically Signed   By: Jorje Guild M.D.   On: 01/12/2021 09:00   CT Head Wo Contrast  Result Date: 01/12/2021 CLINICAL DATA:  Trauma, fall EXAM: CT HEAD WITHOUT CONTRAST TECHNIQUE: Contiguous axial images were obtained from the base of the skull through the vertex without intravenous contrast. COMPARISON:  12/11/2020 FINDINGS: Brain: No acute intracranial findings are seen. Cortical sulci are prominent. There is decreased density in subcortical and periventricular white matter. Vascular: Scattered arterial calcifications are seen. Skull: Unremarkable. Sinuses/Orbits: There is mucosal thickening in the ethmoid and left maxillary sinuses. Other: No significant interval changes are noted. IMPRESSION: No acute intracranial findings are seen. Atrophy. Small-vessel disease. Mild chronic sinusitis. Electronically Signed   By: Elmer Picker M.D.   On: 01/12/2021 09:51   CT Cervical Spine Wo Contrast  Result Date: 01/12/2021 CLINICAL DATA:  Trauma, fall EXAM: CT CERVICAL SPINE WITHOUT CONTRAST TECHNIQUE: Multidetector CT imaging of the cervical spine was performed without intravenous contrast. Multiplanar CT image reconstructions were also generated. COMPARISON:  12/11/2020 FINDINGS: Alignment: Alignment of posterior margins of vertebral bodies is essentially unremarkable. There is minimal anterolisthesis at C6-C7 level which has not changed significantly. Skull base and vertebrae: No recent fracture is seen. There are few small smooth marginated calcifications in the soft tissues posterior to the spinous processes of lower cervical and upper thoracic spine, possibly ligament calcification from previous injury. Degenerative changes are noted with disc space narrowing, bony spurs and facet hypertrophy at multiple  levels. Soft tissues and spinal canal: There is extrinsic pressure over the ventral margin of thecal sac caused by posterior bony spurs at multiple levels, more so at C6-C7 level with mild-to-moderate spinal stenosis. Disc levels: There is encroachment of neural foramina by uncovertebral spurs from C3-C7 levels. Upper chest: Blebs and bullae are seen in the upper lung fields, more so on the right side. Other: No significant interval changes are noted. IMPRESSION: No recent fracture is seen in the cervical spine. Cervical spondylosis with spinal stenosis and encroachment of neural foramina at multiple levels. Overall, no significant interval changes are noted since 12/11/2020. COPD. Electronically Signed   By: Prudy Feeler.D.  On: 01/12/2021 09:59   DG Chest Port 1 View  Result Date: 01/12/2021 CLINICAL DATA:  Hip pain EXAM: PORTABLE CHEST 1 VIEW COMPARISON:  12/11/2020 FINDINGS: Chronic cardiomegaly with dual-chamber pacer. Transcatheter aortic valve replacement. Trace left pleural effusion. Streaky density bilaterally which is vague and favors atelectasis or scarring. IMPRESSION: 1. Chronic cardiomegaly and trace left pleural effusion. 2. Suspect mild atelectasis or scarring. Electronically Signed   By: Jorje Guild M.D.   On: 01/12/2021 08:59   DG C-Arm 1-60 Min-No Report  Result Date: 01/12/2021 Fluoroscopy was utilized by the requesting physician.  No radiographic interpretation.   DG HIP UNILAT WITH PELVIS 2-3 VIEWS LEFT  Result Date: 01/12/2021 CLINICAL DATA:  Placement of left intramedullary nail. EXAM: DG HIP (WITH OR WITHOUT PELVIS) 2-3V LEFT COMPARISON:  Earlier same day left hip radiograph at 0823 hours FINDINGS: Fluoroscopic images were obtained intraoperatively and submitted for post operative interpretation. 6 images were obtained with 60 seconds of fluoroscopy time. Provided images demonstrate placement of left intramedullary rod with proximal lag screw and distal fixation  screw. Hardware is in appropriate position. Please see the performing provider's procedural report for further detail. IMPRESSION: Intraoperative imaging for placement of left intramedullary nail. Please see performing provider's procedural report for further details. Electronically Signed   By: Ileana Roup M.D.   On: 01/12/2021 16:31   DG FEMUR MIN 2 VIEWS LEFT  Result Date: 01/12/2021 CLINICAL DATA:  Fall in bathroom with hip pain EXAM: LEFT FEMUR 2 VIEWS COMPARISON:  None. FINDINGS: Acute intertrochanteric left femur fracture with mild varus angulation. Generalized osteopenia. The mid to distal femur is intact. Atheromatous calcification. IMPRESSION: Acute intertrochanteric left femur fracture. Electronically Signed   By: Jorje Guild M.D.   On: 01/12/2021 08:56    Pending Labs   Vitals/Pain Today's Vitals   01/12/21 1700 01/12/21 1715 01/12/21 1730 01/12/21 1745  BP: (!) 83/43 (!) 79/37 104/60 (!) 103/56  Pulse: 74 80 70 67  Resp: 17 18 16 12   Temp:   (!) 97 F (36.1 C)   TempSrc:      SpO2: 93% 93% 94% 99%  Weight:      Height:      PainSc: 0-No pain  0-No pain 0-No pain    Isolation Precautions @ISOLATION @  Administered Medications Periop Administered Meds from 01/11/2021 1753 to 01/12/2021 1753       Date/Time Order Dose Route Action Action by Comments    01/12/2021 1549 EST 0.9 % irrigation (POUR BTL) 1,000 mL Irrigation Given Poggi, Marshall Cork, MD --    01/12/2021 1733 EST albumin human 5 % solution 12.5 g 12.5 g Intravenous New Bag/Given Gean Quint, RN --    01/20/2021 2200 EST amiodarone (PACERONE) tablet 200 mg -- Gaffer, Automatic --    01/20/2021 1000 EST amiodarone (PACERONE) tablet 200 mg -- Gaffer, Automatic --    01/19/2021 2200 EST amiodarone (PACERONE) tablet 200 mg -- Gaffer, Automatic --    01/19/2021 1000 EST amiodarone (PACERONE) tablet 200 mg --  Gaffer, Automatic --    01/18/2021 2200 EST amiodarone (PACERONE) tablet 200 mg -- Oral Automatically Held Transfer Provider, Automatic --    01/18/2021 1000 EST amiodarone (PACERONE) tablet 200 mg -- Gaffer, Automatic --    01/17/2021 2200 EST amiodarone (PACERONE) tablet 200 mg -- Gaffer, Automatic --    01/17/2021 1000 EST  amiodarone (PACERONE) tablet 200 mg -- Gaffer, Automatic --    01/16/2021 2200 EST amiodarone (PACERONE) tablet 200 mg -- Gaffer, Automatic --    01/16/2021 1000 EST amiodarone (PACERONE) tablet 200 mg -- Gaffer, Automatic --    01/15/2021 2200 EST amiodarone (PACERONE) tablet 200 mg -- Gaffer, Automatic --    01/15/2021 1000 EST amiodarone (PACERONE) tablet 200 mg -- Gaffer, Automatic --    01/14/2021 2200 EST amiodarone (PACERONE) tablet 200 mg -- Gaffer, Automatic --    01/14/2021 1000 EST amiodarone (PACERONE) tablet 200 mg -- Gaffer, Automatic --    01/13/2021 2200 EST amiodarone (PACERONE) tablet 200 mg -- Gaffer, Automatic --    01/13/2021 1000 EST amiodarone (PACERONE) tablet 200 mg -- Gaffer, Automatic --    01/12/2021 2200 EST amiodarone (PACERONE) tablet 200 mg -- Gaffer, Automatic --    01/12/2021 1213 EST amiodarone (PACERONE) tablet 200 mg -- Oral MAR Hold Transfer Provider, Automatic --    01/12/2021 1522 EST bupivacaine 0.75% in dextrose 8.25% (intrathecal) (SENSORCAINE) 0.75-8.25 % injection 1.4 mL Intrathecal Given Mina Marble D, CRNA --    01/12/2021 1618 EST bupivacaine-epinephrine (MARCAINE W/ EPI) 0.5%  -1:200000 injection 30 mL Peri-NEURAL Given Poggi, Marshall Cork, MD --    01/20/2021 1000 EST calcium citrate-vitamin D (CITRACAL+D) 315-200 MG-UNIT per tablet 2 tablet -- Oral Automatically Held Transfer Provider, Automatic --    01/19/2021 1000 EST calcium citrate-vitamin D (CITRACAL+D) 315-200 MG-UNIT per tablet 2 tablet -- Oral Automatically Held Transfer Provider, Automatic --    01/18/2021 1000 EST calcium citrate-vitamin D (CITRACAL+D) 315-200 MG-UNIT per tablet 2 tablet -- Oral Automatically Held Transfer Provider, Automatic --    01/17/2021 1000 EST calcium citrate-vitamin D (CITRACAL+D) 315-200 MG-UNIT per tablet 2 tablet -- Oral Automatically Held Transfer Provider, Automatic --    01/16/2021 1000 EST calcium citrate-vitamin D (CITRACAL+D) 315-200 MG-UNIT per tablet 2 tablet -- Oral Automatically Held Transfer Provider, Automatic --    01/15/2021 1000 EST calcium citrate-vitamin D (CITRACAL+D) 315-200 MG-UNIT per tablet 2 tablet -- Oral Automatically Held Transfer Provider, Automatic --    01/14/2021 1000 EST calcium citrate-vitamin D (CITRACAL+D) 315-200 MG-UNIT per tablet 2 tablet -- Oral Automatically Held Transfer Provider, Automatic --    01/13/2021 1000 EST calcium citrate-vitamin D (CITRACAL+D) 315-200 MG-UNIT per tablet 2 tablet -- Oral Automatically Held Transfer Provider, Automatic --    01/12/2021 1213 EST calcium citrate-vitamin D (CITRACAL+D) 315-200 MG-UNIT per tablet 2 tablet -- Oral MAR Hold Transfer Provider, Automatic --    01/12/2021 1557 EST ceFAZolin (ANCEF) 2-4 GM/100ML-% IVPB --  Override pull for Anesthesia Esaw Grandchild, CRNA Filed by anesthesia medication administration from clinical order [id number]    01/12/2021 1525 EST ceFAZolin (ANCEF) IVPB 2g/100 mL premix 2 g Intravenous Given Mina Marble D, CRNA --    01/12/2021 1213 EST ceFAZolin (ANCEF) IVPB 2g/100 mL premix -- Intravenous MAR Hold Transfer Provider, Automatic --    01/12/2021 1622 EST ePHEDrine injection 10  mg Intravenous Given Mina Marble D, CRNA --    01/12/2021 1555 EST ePHEDrine injection 10 mg Intravenous Given Mina Marble D, CRNA --    01/12/2021 0906 EST fentaNYL citrate (PF) (SUBLIMAZE) injection 100 mcg 100 mcg Intravenous Given Ulla Potash, RN --    01/20/2021 1700  EST ferrous sulfate tablet 325 mg -- Gaffer, Automatic --    01/20/2021 0800 EST ferrous sulfate tablet 325 mg -- Oral Automatically Held Transfer Provider, Automatic --    01/19/2021 1700 EST ferrous sulfate tablet 325 mg -- Oral Automatically Held Transfer Provider, Automatic --    01/19/2021 0800 EST ferrous sulfate tablet 325 mg -- Oral Automatically Held Transfer Provider, Automatic --    01/18/2021 1700 EST ferrous sulfate tablet 325 mg -- Oral Automatically Held Transfer Provider, Automatic --    01/18/2021 0800 EST ferrous sulfate tablet 325 mg -- Oral Automatically Held Transfer Provider, Automatic --    01/17/2021 1700 EST ferrous sulfate tablet 325 mg -- Oral Automatically Held Transfer Provider, Automatic --    01/17/2021 0800 EST ferrous sulfate tablet 325 mg -- Oral Automatically Held Transfer Provider, Automatic --    01/16/2021 1700 EST ferrous sulfate tablet 325 mg -- Oral Automatically Held Transfer Provider, Automatic --    01/16/2021 0800 EST ferrous sulfate tablet 325 mg -- Oral Automatically Held Transfer Provider, Automatic --    01/15/2021 1700 EST ferrous sulfate tablet 325 mg -- Oral Automatically Held Transfer Provider, Automatic --    01/15/2021 0800 EST ferrous sulfate tablet 325 mg -- Oral Automatically Held Transfer Provider, Automatic --    01/14/2021 1700 EST ferrous sulfate tablet 325 mg -- Oral Automatically Held Transfer Provider, Automatic --    01/14/2021 0800 EST ferrous sulfate tablet 325 mg -- Oral Automatically Held Transfer Provider, Automatic --    01/13/2021 1700 EST ferrous sulfate tablet 325 mg -- Oral Automatically Held Transfer Provider,  Automatic --    01/13/2021 0800 EST ferrous sulfate tablet 325 mg -- Oral Automatically Held Transfer Provider, Automatic --    01/12/2021 1700 EST ferrous sulfate tablet 325 mg -- Oral Automatically Held Transfer Provider, Automatic --    01/12/2021 1213 EST ferrous sulfate tablet 325 mg -- Oral MAR Hold Transfer Provider, Automatic --    01/20/2021 1000 EST fesoterodine (TOVIAZ) tablet 4 mg -- Oral Automatically Held Transfer Provider, Automatic --    01/19/2021 1000 EST fesoterodine (TOVIAZ) tablet 4 mg -- Oral Automatically Held Transfer Provider, Automatic --    01/18/2021 1000 EST fesoterodine (TOVIAZ) tablet 4 mg -- Oral Automatically Held Transfer Provider, Automatic --    01/17/2021 1000 EST fesoterodine (TOVIAZ) tablet 4 mg -- Oral Automatically Held Transfer Provider, Automatic --    01/16/2021 1000 EST fesoterodine (TOVIAZ) tablet 4 mg -- Oral Automatically Held Transfer Provider, Automatic --    01/15/2021 1000 EST fesoterodine (TOVIAZ) tablet 4 mg -- Oral Automatically Held Transfer Provider, Automatic --    01/14/2021 1000 EST fesoterodine (TOVIAZ) tablet 4 mg -- Oral Automatically Held Transfer Provider, Automatic --    01/13/2021 1000 EST fesoterodine (TOVIAZ) tablet 4 mg -- Oral Automatically Held Transfer Provider, Automatic --    01/12/2021 1213 EST fesoterodine (TOVIAZ) tablet 4 mg -- Oral MAR Hold Transfer Provider, Automatic --    01/20/2021 1000 EST furosemide (LASIX) tablet 40 mg -- Oral Automatically Held Transfer Provider, Automatic --    01/19/2021 1000 EST furosemide (LASIX) tablet 40 mg -- Oral Automatically Held Transfer Provider, Automatic --    01/18/2021 1000 EST furosemide (LASIX) tablet 40 mg -- Oral Automatically Held Transfer Provider, Automatic --    01/17/2021 1000 EST furosemide (LASIX) tablet 40 mg -- Oral Automatically Held Transfer Provider, Automatic --    01/16/2021 1000 EST furosemide (LASIX) tablet 40 mg -- Oral Automatically Held  Transfer Provider,  Automatic --    01/15/2021 1000 EST furosemide (LASIX) tablet 40 mg -- Oral Automatically Held Transfer Provider, Automatic --    01/14/2021 1000 EST furosemide (LASIX) tablet 40 mg -- Oral Automatically Held Transfer Provider, Automatic --    01/13/2021 1000 EST furosemide (LASIX) tablet 40 mg -- Gaffer, Automatic --    01/12/2021 1213 EST furosemide (LASIX) tablet 40 mg -- Oral MAR Hold Transfer Provider, Automatic --    01/12/2021 1213 EST HYDROcodone-acetaminophen (NORCO/VICODIN) 5-325 MG per tablet 1-2 tablet -- Oral MAR Hold Transfer Provider, Automatic --    01/12/2021 1633 EST lactated ringers infusion -- Intravenous Anesthesia Volume Adjustment Mina Marble D, CRNA --    01/12/2021 1555 EST lactated ringers infusion -- Intravenous Anesthesia Volume Adjustment Mina Marble D, CRNA --    01/12/2021 1510 EST lactated ringers infusion -- Intravenous New Bag/Given Mina Marble D, CRNA --    01/12/2021 1213 EST methocarbamol (ROBAXIN) 500 mg in dextrose 5 % 50 mL IVPB -- Intravenous MAR Hold Transfer Provider, Automatic --    01/12/2021 1213 EST methocarbamol (ROBAXIN) tablet 500 mg -- Oral MAR Hold Transfer Provider, Automatic --    01/20/2021 2200 EST metoprolol tartrate (LOPRESSOR) tablet 50 mg -- Gaffer, Automatic --    01/20/2021 1000 EST metoprolol tartrate (LOPRESSOR) tablet 50 mg -- Gaffer, Automatic --    01/19/2021 2200 EST metoprolol tartrate (LOPRESSOR) tablet 50 mg -- Gaffer, Automatic --    01/19/2021 1000 EST metoprolol tartrate (LOPRESSOR) tablet 50 mg -- Gaffer, Automatic --    01/18/2021 2200 EST metoprolol tartrate (LOPRESSOR) tablet 50 mg -- Gaffer, Automatic --    01/18/2021 1000 EST metoprolol tartrate (LOPRESSOR) tablet 50 mg -- Conservation officer, nature, Automatic --    01/17/2021 2200 EST metoprolol tartrate (LOPRESSOR) tablet 50 mg -- Gaffer, Automatic --    01/17/2021 1000 EST metoprolol tartrate (LOPRESSOR) tablet 50 mg -- Gaffer, Automatic --    01/16/2021 2200 EST metoprolol tartrate (LOPRESSOR) tablet 50 mg -- Gaffer, Automatic --    01/16/2021 1000 EST metoprolol tartrate (LOPRESSOR) tablet 50 mg -- Gaffer, Automatic --    01/15/2021 2200 EST metoprolol tartrate (LOPRESSOR) tablet 50 mg -- Gaffer, Automatic --    01/15/2021 1000 EST metoprolol tartrate (LOPRESSOR) tablet 50 mg -- Gaffer, Automatic --    01/14/2021 2200 EST metoprolol tartrate (LOPRESSOR) tablet 50 mg -- Gaffer, Automatic --    01/14/2021 1000 EST metoprolol tartrate (LOPRESSOR) tablet 50 mg -- Gaffer, Automatic --    01/13/2021 2200 EST metoprolol tartrate (LOPRESSOR) tablet 50 mg -- Gaffer, Automatic --    01/13/2021 1000 EST metoprolol tartrate (LOPRESSOR) tablet 50 mg -- Gaffer, Automatic --    01/12/2021 2200 EST metoprolol tartrate (LOPRESSOR) tablet 50 mg -- Gaffer, Automatic --    01/12/2021 1213 EST metoprolol tartrate (LOPRESSOR) tablet 50 mg -- Oral MAR Hold Transfer Provider, Automatic --    01/12/2021 1213 EST morphine 2 MG/ML injection 0.5 mg -- Intravenous MAR Hold Transfer Provider, Automatic --    01/20/2021 2200 EST oxybutynin (DITROPAN-XL) 24 hr tablet 10 mg -- Oral Automatically  Held Wellsite geologist, Automatic --    01/19/2021 2200 EST oxybutynin (DITROPAN-XL) 24 hr tablet 10 mg -- Oral Automatically Held Transfer Provider, Automatic --    01/18/2021 2200 EST  oxybutynin (DITROPAN-XL) 24 hr tablet 10 mg -- Oral Automatically Held Transfer Provider, Automatic --    01/17/2021 2200 EST oxybutynin (DITROPAN-XL) 24 hr tablet 10 mg -- Oral Automatically Held Transfer Provider, Automatic --    01/16/2021 2200 EST oxybutynin (DITROPAN-XL) 24 hr tablet 10 mg -- Gaffer, Automatic --    01/15/2021 2200 EST oxybutynin (DITROPAN-XL) 24 hr tablet 10 mg -- Gaffer, Automatic --    01/14/2021 2200 EST oxybutynin (DITROPAN-XL) 24 hr tablet 10 mg -- Gaffer, Automatic --    01/13/2021 2200 EST oxybutynin (DITROPAN-XL) 24 hr tablet 10 mg -- Gaffer, Automatic --    01/12/2021 2200 EST oxybutynin (DITROPAN-XL) 24 hr tablet 10 mg -- Gaffer, Automatic --    01/12/2021 1213 EST oxybutynin (DITROPAN-XL) 24 hr tablet 10 mg -- Oral MAR Hold Transfer Provider, Automatic --    01/20/2021 1000 EST pantoprazole (PROTONIX) EC tablet 40 mg -- Gaffer, Automatic --    01/19/2021 1000 EST pantoprazole (PROTONIX) EC tablet 40 mg -- Gaffer, Automatic --    01/18/2021 1000 EST pantoprazole (PROTONIX) EC tablet 40 mg -- Gaffer, Automatic --    01/17/2021 1000 EST pantoprazole (PROTONIX) EC tablet 40 mg -- Gaffer, Automatic --    01/16/2021 1000 EST pantoprazole (PROTONIX) EC tablet 40 mg -- Gaffer, Automatic --    01/15/2021 1000 EST pantoprazole (PROTONIX) EC tablet 40 mg -- Gaffer, Automatic --    01/14/2021 1000 EST pantoprazole (PROTONIX) EC tablet 40 mg -- Gaffer, Automatic --    01/13/2021 1000 EST pantoprazole (PROTONIX) EC tablet 40 mg -- Conservation officer, nature, Automatic --    01/12/2021 1213 EST pantoprazole (PROTONIX) EC tablet 40 mg -- Oral MAR Hold Transfer Provider, Automatic --    01/12/2021 1634 EST phenylephrine (NEO-SYNEPHRINE) 20mg /NS 219mL premix infusion 0 mcg/min Intravenous Enriqueta Shutter D, CRNA --    01/12/2021 1545 EST phenylephrine (NEO-SYNEPHRINE) 20mg /NS 220mL premix infusion 50 mcg/min Intravenous New Bag/Given Mina Marble D, CRNA --    01/12/2021 1555 EST phenylephrine (NEO-SYNEPHRINE) injection 100 mcg Intravenous Given Mina Marble D, CRNA --    01/12/2021 1545 EST phenylephrine (NEO-SYNEPHRINE) injection 100 mcg Intravenous Given Mina Marble D, CRNA --    01/20/2021 1000 EST pravastatin (PRAVACHOL) tablet 20 mg -- Gaffer, Automatic --    01/19/2021 1000 EST pravastatin (PRAVACHOL) tablet 20 mg -- Gaffer, Automatic --    01/18/2021 1000 EST pravastatin (PRAVACHOL) tablet 20 mg -- Gaffer, Automatic --    01/17/2021 1000 EST pravastatin (PRAVACHOL) tablet 20 mg -- Oral Automatically Held Transfer Provider, Automatic --    01/16/2021 1000 EST pravastatin (PRAVACHOL) tablet 20 mg -- Oral Automatically Held Transfer Provider, Automatic --    01/15/2021 1000 EST pravastatin (PRAVACHOL) tablet 20 mg -- Oral Automatically Held Transfer Provider, Automatic --    01/14/2021 1000 EST pravastatin (PRAVACHOL) tablet 20 mg -- Oral Automatically Held Transfer Provider, Automatic --    01/13/2021 1000 EST pravastatin (PRAVACHOL) tablet 20 mg --  Oral Automatically Held Wellsite geologist, Automatic --    01/12/2021 1213 EST pravastatin (PRAVACHOL) tablet 20 mg -- Oral MAR Hold Transfer Provider, Automatic --    01/12/2021 1630 EST propofol (DIPRIVAN) 500 MG/50ML infusion 0 mcg/kg/min Intravenous Stopped Mina Marble D, CRNA --    01/12/2021 1528 EST propofol (DIPRIVAN) 500 MG/50ML infusion 40 mcg/kg/min Intravenous New  Bag/Given Mina Marble D, CRNA --    01/20/2021 2200 EST senna (SENOKOT) tablet 8.6 mg -- Gaffer, Automatic --    01/20/2021 1000 EST senna (SENOKOT) tablet 8.6 mg -- Gaffer, Automatic --    01/19/2021 2200 EST senna (SENOKOT) tablet 8.6 mg -- Gaffer, Automatic --    01/19/2021 1000 EST senna (SENOKOT) tablet 8.6 mg -- Gaffer, Automatic --    01/18/2021 2200 EST senna (SENOKOT) tablet 8.6 mg -- Gaffer, Automatic --    01/18/2021 1000 EST senna (SENOKOT) tablet 8.6 mg -- Gaffer, Automatic --    01/17/2021 2200 EST senna (SENOKOT) tablet 8.6 mg -- Gaffer, Automatic --    01/17/2021 1000 EST senna (SENOKOT) tablet 8.6 mg -- Gaffer, Automatic --    01/16/2021 2200 EST senna (SENOKOT) tablet 8.6 mg -- Gaffer, Automatic --    01/16/2021 1000 EST senna (SENOKOT) tablet 8.6 mg -- Gaffer, Automatic --    01/15/2021 2200 EST senna (SENOKOT) tablet 8.6 mg -- Gaffer, Automatic --    01/15/2021 1000 EST senna (SENOKOT) tablet 8.6 mg -- Gaffer, Automatic --    01/14/2021 2200 EST senna (SENOKOT) tablet 8.6 mg -- Gaffer, Automatic --    01/14/2021 1000 EST senna (SENOKOT) tablet 8.6 mg -- Gaffer, Automatic --    01/13/2021 2200 EST senna (SENOKOT) tablet 8.6 mg -- Gaffer, Automatic --    01/13/2021 1000 EST senna (SENOKOT) tablet 8.6 mg -- Gaffer, Automatic --    01/12/2021 2200 EST senna (SENOKOT) tablet 8.6 mg -- Gaffer,  Automatic --    01/12/2021 1213 EST senna (SENOKOT) tablet 8.6 mg -- Oral MAR Hold Transfer Provider, Automatic --    01/20/2021 0900 EST tamsulosin (FLOMAX) capsule 0.4 mg -- Oral Automatically Held Transfer Provider, Automatic --    01/19/2021 0900 EST tamsulosin (FLOMAX) capsule 0.4 mg -- Oral Automatically Held Transfer Provider, Automatic --    01/18/2021 0900 EST tamsulosin (FLOMAX) capsule 0.4 mg -- Oral Automatically Held Transfer Provider, Automatic --    01/17/2021 0900 EST tamsulosin (FLOMAX) capsule 0.4 mg -- Oral Automatically Held Transfer Provider, Automatic --    01/16/2021 0900 EST tamsulosin (FLOMAX) capsule 0.4 mg -- Oral Automatically Held Transfer Provider, Automatic --    01/15/2021 0900 EST tamsulosin (FLOMAX) capsule 0.4 mg -- Oral Automatically Held Transfer Provider, Automatic --    01/14/2021 0900 EST tamsulosin (FLOMAX) capsule 0.4 mg -- Oral Automatically Held Transfer Provider, Automatic --    01/13/2021 0900 EST tamsulosin (FLOMAX) capsule 0.4 mg -- Oral Automatically Held Transfer Provider, Automatic --    01/12/2021 1213 EST tamsulosin (FLOMAX) capsule 0.4 mg -- Oral MAR Hold Transfer Provider, Automatic --       Mobility {Mobility:[id number]}

## 2021-01-12 NOTE — Anesthesia Postprocedure Evaluation (Signed)
Anesthesia Post Note  Patient: Sedrick Tober [place]  Procedure(s) Performed: INTRAMEDULLARY (IM) NAIL INTERTROCHANTRIC (Left: Hip)  Patient location during evaluation: PACU Anesthesia Type: Spinal Level of consciousness: awake and awake and alert Vital Signs Assessment: post-procedure vital signs reviewed and stable Respiratory status: spontaneous breathing and nonlabored ventilation Cardiovascular status: blood pressure returned to baseline and stable Postop Assessment: no headache and no backache Anesthetic complications: no   No notable events documented.   Last Vitals:  Vitals:   01/12/21 1730 01/12/21 1745  BP: 104/60 (!) 103/56  Pulse: 70 67  Resp: 16 12  Temp: (!) 36.1 C   SpO2: 94% 99%    Last Pain:  Vitals:   01/12/21 1745  TempSrc:   PainSc: 0-No pain                 Antonin Meininger

## 2021-01-12 NOTE — Anesthesia Procedure Notes (Signed)
Spinal  Patient location during procedure: OR Start time: 01/12/2021 3:12 PM End time: 01/12/2021 3:22 PM Reason for block: surgical anesthesia Staffing Performed: resident/CRNA  Resident/CRNA: Esaw Grandchild, CRNA Preanesthetic Checklist Completed: patient identified, IV checked, site marked, risks and benefits discussed, surgical consent, monitors and equipment checked, pre-op evaluation and timeout performed Spinal Block Patient position: right lateral decubitus Prep: ChloraPrep Patient monitoring: heart rate, continuous pulse ox and blood pressure Approach: midline Location: L3-4 Injection technique: single-shot Needle Needle type: Quincke  Needle gauge: 22 G Needle length: 10 cm Assessment Sensory level: T4 Events: CSF return

## 2021-01-12 NOTE — ED Provider Notes (Signed)
[facility] Emergency Department Provider Note ____________________________________________   Event Date/Time   First MD Initiated Contact with Patient 01/12/21 0848     (approximate)  I have reviewed the triage vital signs and the nursing notes.   HISTORY  Chief Complaint Fall  HPI Thomas Matthews is a 87 y.o. male with history of hypertension presents to the emergency department for treatment and evaluation after a mechanical, nonsyncopal fall.  Patient states that he tripped while in the bathroom fell onto the floor.  He arrives via EMS who report that he has rotation and shortening the left lower extremity.  He is unsure if he hit his head but denies having any headache, blurred vision.         History reviewed. No pertinent past medical history.  Patient Active Problem List   Diagnosis Date Noted   Femur fracture, left ([place]) 01/12/2021   Atrial fibrillation, chronic ([place]) 01/12/2021   Chronic diastolic CHF (congestive heart failure) ([place]) 01/12/2021   COPD (chronic obstructive pulmonary disease) ([place]) 01/12/2021   Protein-calorie malnutrition, severe 12/14/2020   Pressure injury of skin 12/13/2020   NSVT (nonsustained ventricular tachycardia) 12/11/2020   History of transcatheter aortic valve replacement (TAVR) 12/11/2020   Acid reflux 07/27/2017   Acne erythematosa 07/27/2017   Flutter-fibrillation 07/27/2017   BP (high blood pressure) 07/27/2017   Cardiac pacemaker in situ 07/27/2017   CAD (coronary artery disease) 07/27/2017   DD (diverticular disease) 07/27/2017   History of GI bleed 04/16/2008   Benign neoplasm of colon 02/27/2004    History reviewed. No pertinent surgical history.  Prior to Admission medications   Medication Sig Start Date End Date Taking? Authorizing Provider  amiodarone (PACERONE) 200 MG tablet Take 1 tablet (200 mg total) by mouth 2 (two) times daily. 12/17/20   [place] Donnamarie Poag, DO  calcium citrate-vitamin D  (CITRACAL+D) 315-200 MG-UNIT tablet Take 2 tablets by mouth daily.    [provider]  ferrous sulfate 325 (65 FE) MG tablet Take 1 tablet (325 mg total) by mouth 2 (two) times daily with a meal. 12/17/20   [place] Donnamarie Poag, DO  furosemide (LASIX) 40 MG tablet Take 1 tablet (40 mg total) by mouth daily. 12/17/20   [place] Donnamarie Poag, DO  metoprolol tartrate (LOPRESSOR) 25 MG tablet Take 2 tablets (50 mg total) by mouth 2 (two) times daily. 12/17/20 12/17/21  [place] Donnamarie Poag, DO  oxybutynin (DITROPAN-XL) 10 MG 24 hr tablet Take 10 mg by mouth at bedtime.    [provider]  pantoprazole (PROTONIX) 40 MG tablet Take 1 tablet (40 mg total) by mouth 2 (two) times daily for 28 days, THEN 1 tablet (40 mg total) 2 (two) times daily. 12/17/20 04/14/21  [place] Donnamarie Poag, DO  pravastatin (PRAVACHOL) 20 MG tablet Take 20 mg by mouth daily.    [provider]  tamsulosin (FLOMAX) 0.4 MG CAPS capsule Take 0.4 mg by mouth daily after breakfast. 11/11/11   [provider]  tolterodine (DETROL LA) 4 MG 24 hr capsule Take 4 mg by mouth daily. 03/14/20   [provider]    Allergies Amlodipine besylate, Felodipine, Oxybutynin chloride, Simvastatin, and Terazosin  History reviewed. No pertinent family history.  Social History Social History   Tobacco Use   Smoking status: Former   Smokeless tobacco: Never  Substance Use Topics   Alcohol use: No   Drug use: No    Review of Systems  Constitutional: No fever/chills Eyes: No visual changes.  ENT: No sore throat. Cardiovascular: Denies chest pain. Respiratory: Denies shortness of breath. Gastrointestinal: No abdominal pain.  No nausea, no vomiting.  No diarrhea.  No constipation. Genitourinary: Negative for dysuria. Musculoskeletal: Positive for left hip pain Skin: Negative for rash. Neurological: Negative for headaches, focal weakness or numbness  ____________________________________________   PHYSICAL  EXAM:  VITAL SIGNS: ED Triage Vitals [01/12/21 0813]  Enc Vitals Group     BP (!) 181/109     Pulse Rate 62     Resp 18     Temp 97.6 F (36.4 C)     Temp Source Oral     SpO2 91 %     Weight      Height      Head Circumference      Peak Flow      Pain Score      Pain Loc      Pain Edu?      Excl. in [place]?     Constitutional: Alert and oriented. Well appearing and in no acute distress. Eyes: Conjunctivae are normal. PERRL. EOMI. Head: Atraumatic. Nose: No congestion/rhinnorhea. Mouth/Throat: Mucous membranes are moist.  Oropharynx non-erythematous. Neck: No stridor.   Hematological/Lymphatic/Immunilogical: No cervical lymphadenopathy. Cardiovascular: Normal rate, regular rhythm. Grossly normal heart sounds.  Good peripheral circulation. Respiratory: Normal respiratory effort.  No retractions. Lungs CTAB. Gastrointestinal: Soft and nontender. No distention. No abdominal bruits. No CVA tenderness. Genitourinary:  Musculoskeletal: No lower extremity tenderness nor edema.  No joint effusions. Neurologic:  Normal speech and language. No gross focal neurologic deficits are appreciated. No gait instability. Skin:  Skin is warm, dry and intact. No rash noted. Psychiatric: Mood and affect are normal. Speech and behavior are normal.  ____________________________________________   LABS (all labs ordered are listed, but only abnormal results are displayed)  Labs Reviewed  BASIC METABOLIC PANEL - Abnormal; Notable for the following components:      Result Value   Sodium 134 (*)    Glucose, Bld 103 (*)    Calcium 8.4 (*)    GFR, Estimated 60 (*)    All other components within normal limits  CBC - Abnormal; Notable for the following components:   RBC 3.72 (*)    Hemoglobin 12.4 (*)    HCT 37.1 (*)    RDW 16.4 (*)    Platelets 145 (*)    All other components within normal limits  RESP PANEL BY RT-PCR (FLU A&B, COVID) ARPGX2  CBC  BASIC METABOLIC PANEL  TYPE AND SCREEN    ____________________________________________  EKG  ED ECG REPORT I, Ginny Loomer, FNP-BC personally viewed and interpreted this ECG.   Date: 01/12/2021  EKG Time: 0809  Rate: 61  Rhythm: LBBB  Axis: rightward  Intervals:left bundle branch block  ST&T Change: no   ____________________________________________  RADIOLOGY  ED MD interpretation:    Acute intertrochanteric left femur fracture.  I, Sherrie George, personally viewed and evaluated these images (plain radiographs) as part of my medical decision making, as well as reviewing the written report by the radiologist.  Official radiology report(s): DG Pelvis 1-2 Views  Result Date: 01/12/2021 CLINICAL DATA:  Fall with hip pain EXAM: PELVIS - 1-2 VIEW COMPARISON:  None. FINDINGS: Acute intertrochanteric left femur fracture with mild varus angulation. No evidence of pelvic ring fracture or diastasis. Generalized osteopenia and atheromatous calcification. Gas over the right groin suggesting hernia. IMPRESSION: 1. Acute intertrochanteric left femur fracture. 2. Generalized osteopenia. 3. Gas over the right groin suggesting bowel containing hernia.  Electronically Signed   By: Jorje Guild M.D.   On: 01/12/2021 09:00   CT Head Wo Contrast  Result Date: 01/12/2021 CLINICAL DATA:  Trauma, fall EXAM: CT HEAD WITHOUT CONTRAST TECHNIQUE: Contiguous axial images were obtained from the base of the skull through the vertex without intravenous contrast. COMPARISON:  12/11/2020 FINDINGS: Brain: No acute intracranial findings are seen. Cortical sulci are prominent. There is decreased density in subcortical and periventricular white matter. Vascular: Scattered arterial calcifications are seen. Skull: Unremarkable. Sinuses/Orbits: There is mucosal thickening in the ethmoid and left maxillary sinuses. Other: No significant interval changes are noted. IMPRESSION: No acute intracranial findings are seen. Atrophy. Small-vessel disease. Mild chronic  sinusitis. Electronically Signed   By: Elmer Picker M.D.   On: 01/12/2021 09:51   CT Cervical Spine Wo Contrast  Result Date: 01/12/2021 CLINICAL DATA:  Trauma, fall EXAM: CT CERVICAL SPINE WITHOUT CONTRAST TECHNIQUE: Multidetector CT imaging of the cervical spine was performed without intravenous contrast. Multiplanar CT image reconstructions were also generated. COMPARISON:  12/11/2020 FINDINGS: Alignment: Alignment of posterior margins of vertebral bodies is essentially unremarkable. There is minimal anterolisthesis at C6-C7 level which has not changed significantly. Skull base and vertebrae: No recent fracture is seen. There are few small smooth marginated calcifications in the soft tissues posterior to the spinous processes of lower cervical and upper thoracic spine, possibly ligament calcification from previous injury. Degenerative changes are noted with disc space narrowing, bony spurs and facet hypertrophy at multiple levels. Soft tissues and spinal canal: There is extrinsic pressure over the ventral margin of thecal sac caused by posterior bony spurs at multiple levels, more so at C6-C7 level with mild-to-moderate spinal stenosis. Disc levels: There is encroachment of neural foramina by uncovertebral spurs from C3-C7 levels. Upper chest: Blebs and bullae are seen in the upper lung fields, more so on the right side. Other: No significant interval changes are noted. IMPRESSION: No recent fracture is seen in the cervical spine. Cervical spondylosis with spinal stenosis and encroachment of neural foramina at multiple levels. Overall, no significant interval changes are noted since 12/11/2020. COPD. Electronically Signed   By: Elmer Picker M.D.   On: 01/12/2021 09:59   DG Chest Port 1 View  Result Date: 01/12/2021 CLINICAL DATA:  Hip pain EXAM: PORTABLE CHEST 1 VIEW COMPARISON:  12/11/2020 FINDINGS: Chronic cardiomegaly with dual-chamber pacer. Transcatheter aortic valve replacement.  Trace left pleural effusion. Streaky density bilaterally which is vague and favors atelectasis or scarring. IMPRESSION: 1. Chronic cardiomegaly and trace left pleural effusion. 2. Suspect mild atelectasis or scarring. Electronically Signed   By: Jorje Guild M.D.   On: 01/12/2021 08:59   DG C-Arm 1-60 Min-No Report  Result Date: 01/12/2021 Fluoroscopy was utilized by the requesting physician.  No radiographic interpretation.   DG HIP UNILAT WITH PELVIS 2-3 VIEWS LEFT  Result Date: 01/12/2021 CLINICAL DATA:  Placement of left intramedullary nail. EXAM: DG HIP (WITH OR WITHOUT PELVIS) 2-3V LEFT COMPARISON:  Earlier same day left hip radiograph at 0823 hours FINDINGS: Fluoroscopic images were obtained intraoperatively and submitted for post operative interpretation. 6 images were obtained with 60 seconds of fluoroscopy time. Provided images demonstrate placement of left intramedullary rod with proximal lag screw and distal fixation screw. Hardware is in appropriate position. Please see the performing provider's procedural report for further detail. IMPRESSION: Intraoperative imaging for placement of left intramedullary nail. Please see performing provider's procedural report for further details. Electronically Signed   By: Ileana Roup M.D.   On:  01/12/2021 16:31   DG FEMUR MIN 2 VIEWS LEFT  Result Date: 01/12/2021 CLINICAL DATA:  Fall in bathroom with hip pain EXAM: LEFT FEMUR 2 VIEWS COMPARISON:  None. FINDINGS: Acute intertrochanteric left femur fracture with mild varus angulation. Generalized osteopenia. The mid to distal femur is intact. Atheromatous calcification. IMPRESSION: Acute intertrochanteric left femur fracture. Electronically Signed   By: Jorje Guild M.D.   On: 01/12/2021 08:56    ____________________________________________   PROCEDURES  Procedure(s) performed (including Critical Care):  Procedures  ____________________________________________   INITIAL IMPRESSION /  ASSESSMENT AND PLAN   87 year old male presenting to the emergency department after mechanical, nonsyncopal fall.  See HPI for further details.  Plan will be to get imaging basic screening labs, COVID and EKG.  DIFFERENTIAL DIAGNOSIS  Hip fracture, femur fracture  ED COURSE  Imaging of the left hip indicated an intertrochanteric fracture.  Dr. Roland Rack consulted and will take him to the OR if medically clear and not on anticoagulants.  Appears that the patient was taken off Eliquis in early November due to anemia.  Dr. Richardine Service aware that he is not currently anticoagulated.  Hospitalist has accepted for admission.  Plan for surgical intervention and admission discussed with the patient who is in agreement.   Clinical Course as of 01/13/21 0816  Mon Jan 13, 2021  0757 DG FEMUR MIN 2 VIEWS LEFT [CT]    Clinical Course User Index [CT] Devonte Migues B, FNP    As part of my medical decision making, I reviewed the following data within the [facility] consult was requested and obtained from this/these consultant(s) Orthopedics, Hospitalist  ___________________________________________   FINAL CLINICAL IMPRESSION(S) / ED DIAGNOSES  Final diagnoses:  Closed nondisplaced intertrochanteric fracture of left femur, initial encounter [facility])     ED Discharge Orders     None        KEIGEN CADDELL was evaluated in Emergency Department on 01/13/2021 for the symptoms described in the history of present illness. He was evaluated in the context of the global COVID-19 pandemic, which necessitated consideration that the patient might be at risk for infection with the SARS-CoV-2 virus that causes COVID-19. Institutional protocols and algorithms that pertain to the evaluation of patients at risk for COVID-19 are in a state of rapid change based on information released by regulatory bodies including the CDC and federal and state organizations. These policies and algorithms were followed  during the patient's care in the ED.   Note:  This document was prepared using Dragon voice recognition software and may include unintentional dictation errors.    Victorino Dike, FNP 01/13/21 [id number]    Naaman Plummer, MD 01/13/21 1515

## 2021-01-12 NOTE — Anesthesia Preprocedure Evaluation (Addendum)
Anesthesia Evaluation   Patient awake    Reviewed: Allergy & Precautions, NPO status , Patient's Chart, lab work & pertinent test results  History of Anesthesia Complications Negative for: history of anesthetic complications  Airway Mallampati: II  TM Distance: >3 FB Neck ROM: Limited    Dental  (+) Lower Dentures, Upper Dentures   Pulmonary former smoker,    Pulmonary exam normal        Cardiovascular hypertension, + CAD and +CHF (chronic diastolic)  Normal cardiovascular exam+ dysrhythmias (sick sinus, pacemaker-afib-no blood thinners) Atrial Fibrillation + pacemaker  Rhythm:Regular     Neuro/Psych    GI/Hepatic GERD  ,  Endo/Other  negative endocrine ROS  Renal/GU      Musculoskeletal negative musculoskeletal ROS (+)   Abdominal   Peds negative pediatric ROS (+)  Hematology negative hematology ROS (+)   Anesthesia Other Findings H/o AVR-  ECHO 11/2020: EF 50-55%, no wall motion abnormality, mod RA dilatation, mild MVR, no aortic valve disease  S/p Fall--CT head and neck today which showed:   Reproductive/Obstetrics negative OB ROS                          Anesthesia Physical Anesthesia Plan  ASA: 3  Anesthesia Plan: Spinal and MAC   Post-op Pain Management: Regional block   Induction:   PONV Risk Score and Plan:   Airway Management Planned:   Additional Equipment:   Intra-op Plan:   Post-operative Plan: Extubation in OR  Informed Consent:   Plan Discussed with: Anesthesiologist  Anesthesia Plan Comments: (Discussed GA backup)        Anesthesia Quick Evaluation

## 2021-01-12 NOTE — ED Notes (Signed)
Consent done in room by Dr Roland Rack with pt and daughter present.

## 2021-01-12 NOTE — ED Notes (Addendum)
RN called and gave report to primary RN on 2C.

## 2021-01-12 NOTE — Consult Note (Signed)
ORTHOPAEDIC CONSULTATION  REQUESTING PHYSICIAN: Collier Bullock, MD  Chief Complaint:   Left hip pain.  History of Present Illness: Thomas Matthews is a 87 y.o. male with multiple medical problems including coronary artery disease, congestive heart failure, chronic atrial fibrillation, history of severe aortic stenosis, hypertension, and early dementia who recently has been in a rehab facility while recuperating from a hospitalization 4 weeks ago for congestive heart failure.  The patient apparently tripped and fell and his bathroom this morning, landing on his left hip.  He was unable to get up so he was brought to the emergency room where x-rays demonstrated a displaced intertrochanteric fracture of the left hip.  The patient denies any associated injuries, although he does exhibit lacerations to the posterior aspect of his left elbow and to his left knee.  He denies striking his head or losing consciousness.  The patient also denies any lightheadedness, dizziness, chest pain, shortness of breath, or other symptoms which may have precipitated his fall.  History reviewed. No pertinent past medical history. History reviewed. No pertinent surgical history. Social History   Socioeconomic History   Marital status: Married    Spouse name: Not on file   Number of children: Not on file   Years of education: Not on file   Highest education level: Not on file  Occupational History   Not on file  Tobacco Use   Smoking status: Former   Smokeless tobacco: Never  Substance and Sexual Activity   Alcohol use: No   Drug use: No   Sexual activity: Not on file  Other Topics Concern   Not on file  Social History Narrative   Not on file   Social Determinants of Health   Financial Resource Strain: Not on file  Food Insecurity: Not on file  Transportation Needs: Not on file  Physical Activity: Not on file  Stress: Not on file   Social Connections: Not on file   History reviewed. No pertinent family history. Allergies  Allergen Reactions   Amlodipine Besylate Swelling   Felodipine Swelling   Oxybutynin Chloride Other (See Comments)    Other reaction(s): Dizziness Tolerates taking at night   Simvastatin     Other reaction(s): Dizziness   Terazosin     Other reaction(s): Low blood pressure   Prior to Admission medications   Medication Sig Start Date End Date Taking? Authorizing Provider  amiodarone (PACERONE) 200 MG tablet Take 1 tablet (200 mg total) by mouth 2 (two) times daily. 12/17/20   [place] Donnamarie Poag, DO  calcium citrate-vitamin D (CITRACAL+D) 315-200 MG-UNIT tablet Take 2 tablets by mouth daily.    [provider]  ferrous sulfate 325 (65 FE) MG tablet Take 1 tablet (325 mg total) by mouth 2 (two) times daily with a meal. 12/17/20   [place] Donnamarie Poag, DO  furosemide (LASIX) 40 MG tablet Take 1 tablet (40 mg total) by mouth daily. 12/17/20   [place] Donnamarie Poag, DO  metoprolol tartrate (LOPRESSOR) 25 MG tablet Take 2 tablets (50 mg total) by mouth 2 (two) times daily. 12/17/20 12/17/21  [place] Donnamarie Poag, DO  oxybutynin (DITROPAN-XL) 10 MG 24 hr tablet Take 10 mg by mouth at bedtime.    [provider]  pantoprazole (PROTONIX) 40 MG tablet Take 1 tablet (40 mg total) by mouth 2 (two) times daily for 28 days, THEN 1 tablet (40 mg total) 2 (two) times daily. 12/17/20 04/14/21  [place] Donnamarie Poag, DO  pravastatin (PRAVACHOL) 20 MG tablet Take 20  mg by mouth daily.    [provider]  tamsulosin (FLOMAX) 0.4 MG CAPS capsule Take 0.4 mg by mouth daily after breakfast. 11/11/11   [provider]  tolterodine (DETROL LA) 4 MG 24 hr capsule Take 4 mg by mouth daily. 03/14/20   [provider]   DG Pelvis 1-2 Views  Result Date: 01/12/2021 CLINICAL DATA:  Fall with hip pain EXAM: PELVIS - 1-2 VIEW COMPARISON:  None. FINDINGS: Acute intertrochanteric left femur fracture with mild varus  angulation. No evidence of pelvic ring fracture or diastasis. Generalized osteopenia and atheromatous calcification. Gas over the right groin suggesting hernia. IMPRESSION: 1. Acute intertrochanteric left femur fracture. 2. Generalized osteopenia. 3. Gas over the right groin suggesting bowel containing hernia. Electronically Signed   By: Jorje Guild M.D.   On: 01/12/2021 09:00   CT Head Wo Contrast  Result Date: 01/12/2021 CLINICAL DATA:  Trauma, fall EXAM: CT HEAD WITHOUT CONTRAST TECHNIQUE: Contiguous axial images were obtained from the base of the skull through the vertex without intravenous contrast. COMPARISON:  12/11/2020 FINDINGS: Brain: No acute intracranial findings are seen. Cortical sulci are prominent. There is decreased density in subcortical and periventricular white matter. Vascular: Scattered arterial calcifications are seen. Skull: Unremarkable. Sinuses/Orbits: There is mucosal thickening in the ethmoid and left maxillary sinuses. Other: No significant interval changes are noted. IMPRESSION: No acute intracranial findings are seen. Atrophy. Small-vessel disease. Mild chronic sinusitis. Electronically Signed   By: Elmer Picker M.D.   On: 01/12/2021 09:51   CT Cervical Spine Wo Contrast  Result Date: 01/12/2021 CLINICAL DATA:  Trauma, fall EXAM: CT CERVICAL SPINE WITHOUT CONTRAST TECHNIQUE: Multidetector CT imaging of the cervical spine was performed without intravenous contrast. Multiplanar CT image reconstructions were also generated. COMPARISON:  12/11/2020 FINDINGS: Alignment: Alignment of posterior margins of vertebral bodies is essentially unremarkable. There is minimal anterolisthesis at C6-C7 level which has not changed significantly. Skull base and vertebrae: No recent fracture is seen. There are few small smooth marginated calcifications in the soft tissues posterior to the spinous processes of lower cervical and upper thoracic spine, possibly ligament calcification from  previous injury. Degenerative changes are noted with disc space narrowing, bony spurs and facet hypertrophy at multiple levels. Soft tissues and spinal canal: There is extrinsic pressure over the ventral margin of thecal sac caused by posterior bony spurs at multiple levels, more so at C6-C7 level with mild-to-moderate spinal stenosis. Disc levels: There is encroachment of neural foramina by uncovertebral spurs from C3-C7 levels. Upper chest: Blebs and bullae are seen in the upper lung fields, more so on the right side. Other: No significant interval changes are noted. IMPRESSION: No recent fracture is seen in the cervical spine. Cervical spondylosis with spinal stenosis and encroachment of neural foramina at multiple levels. Overall, no significant interval changes are noted since 12/11/2020. COPD. Electronically Signed   By: Elmer Picker M.D.   On: 01/12/2021 09:59   DG Chest Port 1 View  Result Date: 01/12/2021 CLINICAL DATA:  Hip pain EXAM: PORTABLE CHEST 1 VIEW COMPARISON:  12/11/2020 FINDINGS: Chronic cardiomegaly with dual-chamber pacer. Transcatheter aortic valve replacement. Trace left pleural effusion. Streaky density bilaterally which is vague and favors atelectasis or scarring. IMPRESSION: 1. Chronic cardiomegaly and trace left pleural effusion. 2. Suspect mild atelectasis or scarring. Electronically Signed   By: Jorje Guild M.D.   On: 01/12/2021 08:59   DG FEMUR MIN 2 VIEWS LEFT  Result Date: 01/12/2021 CLINICAL DATA:  Fall in bathroom with hip  pain EXAM: LEFT FEMUR 2 VIEWS COMPARISON:  None. FINDINGS: Acute intertrochanteric left femur fracture with mild varus angulation. Generalized osteopenia. The mid to distal femur is intact. Atheromatous calcification. IMPRESSION: Acute intertrochanteric left femur fracture. Electronically Signed   By: Jorje Guild M.D.   On: 01/12/2021 08:56    Positive ROS: All other systems have been reviewed and were otherwise negative with the  exception of those mentioned in the HPI and as above.  Physical Exam: General:  Alert, no acute distress Psychiatric:  Patient is not clearly competent for consent, but exhibits normal mood and affect   Cardiovascular:  No pedal edema Respiratory:  No wheezing, non-labored breathing GI:  Abdomen is soft and non-tender Skin:  No lesions in the area of chief complaint Neurologic:  Sensation intact distally Lymphatic:  No axillary or cervical lymphadenopathy  Orthopedic Exam:  Orthopedic examination is limited to the left hip and lower extremity.  Left lower extremity is in a somewhat shortened and externally rotated position as compared to the right.  Skin inspection around the left hip is unremarkable.  No swelling, erythema, ecchymosis, abrasions, or other skin abnormalities are identified.  He has mild tenderness to palpation of the lateral aspect of the left hip.  He has more severe pain with any attempted active or passive motion of the left hip.  He is neurovascularly intact to the left lower extremity and foot.  X-rays:  X-rays of the pelvis and left femur are available for review and have been reviewed by myself.  These films demonstrate an acute displaced intertrochanteric fracture of the left hip region.  Mild degenerative changes of the left hip are noted, but no lytic lesions or other acute bony abnormalities are identified.  Assessment: Displaced intertrochanteric fracture left hip.  Plan: The treatment options, including both surgical and nonsurgical choices, have been discussed in detail with the patient and his daughter who is at the bedside.  Both the patient and his daughter would like to proceed with surgical intervention to include an intramedullary nailing of the displaced intertrochanteric fracture of his left hip.  The risks (including bleeding, infection, nerve and/or blood vessel injury, persistent or recurrent pain, loosening or failure of the components, leg length  inequality, dislocation, need for further surgery, blood clots, strokes, heart attacks or arrhythmias, pneumonia, etc.) and benefits of the surgical procedure were discussed.  On behalf of the patient, the daughter states her understanding and agrees to proceed.  A formal written consent has been obtained.  Thank you for asking left hip fracture me to participate in the care of this most pleasant yet unfortunate man.  I will be happy to follow him with you.   Pascal Lux, MD  Beeper #:  [phone]  01/12/2021 10:46 AM

## 2021-01-12 NOTE — ED Notes (Signed)
Revised noted , it was the patients left hip/ femur , with shortening and rotation to left leg.

## 2021-01-12 NOTE — Op Note (Signed)
01/12/2021  4:50 PM  Patient:   Thomas Matthews  Pre-Op Diagnosis:   Closed displaced intertrochanteric fracture, left hip.  Post-Op Diagnosis:   Same  Procedure:   Reduction and internal fixation of displaced intertrochanteric left hip fracture with Biomet Affixis TFN nail.  Surgeon:   Pascal Lux, MD  Assistant:   None  Anesthesia:   Spinal  Findings:   As above  Complications:   None  EBL:   50 cc  Fluids:   700 cc crystalloid  UOP:   None cc  TT:   None  Drains:   None  Closure:   Staples  Implants:   Biomet Affixis 11 x 400 mm TFN with a 100 mm lag screw and a 44 mm distal interlocking screw  Brief Clinical Note:   The patient is a 87 year old male who sustained the above-noted injury early this morning when he tripped and fell in his bathroom at his assisted living facility. The patient was brought to the emergency room where x-rays demonstrated the above-noted injury. The patient has been cleared medically and presents at this time for reduction and internal fixation of the displaced intertrochanteric left hip fracture.  Procedure:   The patient was brought into the operating room. After adequate spinal anesthesia was obtained, the patient was lain in the supine position on the fracture table. The uninjured leg was placed in a flexed and abducted position while the injured lower extremity was placed in longitudinal traction. The fracture was reduced using longitudinal traction and internal rotation. The adequacy of reduction was verified fluoroscopically in AP and lateral projections and found to be near anatomic. The lateral aspects of the left hip and thigh were prepped with ChloraPrep solution before being draped sterilely. Preoperative antibiotics were administered. A timeout was performed to verify the appropriate surgical site.   The greater trochanter was identified fluoroscopically and an approximately 5-6 cm incision made about 2-3 fingerbreadths above  the tip of the greater trochanter. The incision was carried down through the subcutaneous tissues to expose the gluteal fascia. This was split the length of the incision, providing access to the tip of the trochanter. Under fluoroscopic guidance, a guidewire was drilled through the tip of the trochanter into the proximal metaphysis to the level of the lesser trochanter. After verifying its position fluoroscopically in AP and lateral projections, it was overreamed with the initial reamer to the depth of the lesser trochanter. A guidewire was passed down through the femoral canal to the supracondylar region. The adequacy of guidewire position was verified fluoroscopically in AP and lateral projections before the length of the guidewire within the canal was measured and found to be 420 mm. Therefore, a 400 mm length nail was selected. The guidewire was overreamed sequentially using the flexible reamers, beginning with a 10.5 mm reamer and progressing to a 12.5 mm reamer. This provided good cortical chatter. The 11 x 400 mm Biomet Affixis TFN rod was selected and advanced to the appropriate depth, as verified fluoroscopically.   The guide system for the lag screw was positioned and advanced through an approximately 2 cm stab incision over the lateral aspect of the proximal femur. The guidewire was drilled up through the trochanteric femoral nail and into the femoral neck to rest within 5 mm of subchondral bone. After verifying its position in the femoral neck and head in both AP and lateral projections, the guidewire was measured and found to be optimally replicated by a 935 mm lag screw.  The guidewire was overreamed to the appropriate depth before the lag screw was inserted and advanced to the appropriate depth as verified fluoroscopically in AP and lateral projections. The locking screw was advanced, then backed off a quarter turn to set the lag screw. Again the adequacy of hardware position and fracture reduction  was verified fluoroscopically in AP and lateral projections and found to be excellent.  Attention was directed distally. Using the "perfect circle" technique, the leg and fluoroscopy machine were positioned appropriately. An approximately 1.5 cm stab incision was made over the skin at the appropriate point before the drill bit was advanced through the cortex and across the static hole of the nail. The appropriate length of the screw was determined before the 44 mm distal interlocking screw was positioned, then advanced and tightened securely. Again the adequacy of screw position was verified fluoroscopically in AP and lateral projections and found to be excellent.  The wounds were irrigated thoroughly with sterile saline solution before the abductor fascia was reapproximated using #1 Vicryl interrupted sutures. The subcutaneous tissues were closed using 2-0 Vicryl interrupted sutures. The skin was closed using staples. A total of 30 cc of 0.5% Sensorcaine with epinephrine was injected in and around all incisions. Sterile occlusive dressings were applied to all wounds before the patient was transferred back to his/her hospital bed. The patient was then transferred to the recovery room in satisfactory condition after tolerating the procedure well.

## 2021-01-13 ENCOUNTER — Encounter: Payer: Self-pay | Admitting: Surgery

## 2021-01-13 DIAGNOSIS — I2510 Atherosclerotic heart disease of native coronary artery without angina pectoris: Secondary | ICD-10-CM | POA: Diagnosis not present

## 2021-01-13 DIAGNOSIS — S7292XA Unspecified fracture of left femur, initial encounter for closed fracture: Secondary | ICD-10-CM | POA: Diagnosis not present

## 2021-01-13 DIAGNOSIS — Z8679 Personal history of other diseases of the circulatory system: Secondary | ICD-10-CM

## 2021-01-13 DIAGNOSIS — N400 Enlarged prostate without lower urinary tract symptoms: Secondary | ICD-10-CM | POA: Diagnosis not present

## 2021-01-13 DIAGNOSIS — J449 Chronic obstructive pulmonary disease, unspecified: Secondary | ICD-10-CM

## 2021-01-13 DIAGNOSIS — I482 Chronic atrial fibrillation, unspecified: Secondary | ICD-10-CM | POA: Diagnosis not present

## 2021-01-13 LAB — BASIC METABOLIC PANEL
Anion gap: 4 — ABNORMAL LOW (ref 5–15)
BUN: 18 mg/dL (ref 8–23)
CO2: 25 mmol/L (ref 22–32)
Calcium: 7.4 mg/dL — ABNORMAL LOW (ref 8.9–10.3)
Chloride: 101 mmol/L (ref 98–111)
Creatinine, Ser: 0.99 mg/dL (ref 0.61–1.24)
GFR, Estimated: >60 mL/min (ref >60)
Glucose, Bld: 113 mg/dL — ABNORMAL HIGH (ref 70–99)
Potassium: 4.1 mmol/L (ref 3.5–5.1)
Sodium: 130 mmol/L — ABNORMAL LOW (ref 135–145)

## 2021-01-13 LAB — CBC
HCT: 24.9 % — ABNORMAL LOW (ref 39.0–52.0)
Hemoglobin: 8.5 g/dL — ABNORMAL LOW (ref 13.0–17.0)
MCH: 34.1 pg — ABNORMAL HIGH (ref 26.0–34.0)
MCHC: 34.1 g/dL (ref 30.0–36.0)
MCV: 100 fL (ref 80.0–100.0)
Platelets: 108 10*3/uL — ABNORMAL LOW (ref 150–400)
RBC: 2.49 MIL/uL — ABNORMAL LOW (ref 4.22–5.81)
RDW: 16.7 % — ABNORMAL HIGH (ref 11.5–15.5)
WBC: 10.9 10*3/uL — ABNORMAL HIGH (ref 4.0–10.5)
nRBC: 0.2 % (ref 0.0–0.2)

## 2021-01-13 MED ORDER — ENSURE ENLIVE PO LIQD
237.0000 mL | Freq: Two times a day (BID) | ORAL | Status: DC
  Administered 2021-01-14 – 2021-01-15 (×2): 237 mL via ORAL

## 2021-01-13 MED ORDER — ADULT MULTIVITAMIN W/MINERALS CH
1.0000 | ORAL_TABLET | Freq: Every day | ORAL | Status: DC
  Administered 2021-01-14 – 2021-01-15 (×2): 1 via ORAL
  Filled 2021-01-13 (×2): qty 1

## 2021-01-13 NOTE — Progress Notes (Signed)
PT Cancellation Note  Patient Details Name: Thomas Matthews MRN: [id number] DOB: 16-Jul-1923   Cancelled Treatment:    Reason Eval/Treat Not Completed: Patient declined, no reason specified. Pt supine in bed upon arrival; he states he "walks all the time" when PT explains PT role. He states he is not doing well today and refuses out of bed mobility. PT asks when pt would like therapy to return. Mid-conversation, pt closed eyes and began snoring - ceasing response to PT questions. Therapy will return at later time/date as pt is appropriate and agreeable.    Patrina Levering PT, DPT 01/13/21 8:53 AM [phone]

## 2021-01-13 NOTE — Progress Notes (Signed)
  Subjective: 1 Day Post-Op Procedure(s) (LRB): INTRAMEDULLARY (IM) NAIL INTERTROCHANTRIC (Left) Patient reports pain everywhere and states he "isn't worth a damn" Patient is  confused and combative this morning. Plan is to go Skilled nursing facility after hospital stay. Negative for chest pain and shortness of breath Fever: no Gastrointestinal:Negative for nausea and vomiting  Objective: Vital signs in last 24 hours: Temp:  [97 F (36.1 C)-97.8 F (36.6 C)] 97.8 F (36.6 C) (11/28 0452) Pulse Rate:  [59-91] 59 (11/28 0452) Resp:  [11-22] 18 (11/28 0452) BP: (79-181)/(37-109) 105/63 (11/28 0452) SpO2:  [91 %-100 %] 94 % (11/28 0452) Weight:  [61.2 kg] 61.2 kg (11/27 0908)  Intake/Output from previous day:  Intake/Output Summary (Last 24 hours) at 01/13/2021 0728 Last data filed at 01/13/2021 0305 Gross per 24 hour  Intake 1116.43 ml  Output 1750 ml  Net -633.57 ml    Intake/Output this shift: No intake/output data recorded.  Labs: Recent Labs    01/12/21 0817  HGB 12.4*   Recent Labs    01/12/21 0817  WBC 6.0  RBC 3.72*  HCT 37.1*  PLT 145*   Recent Labs    01/12/21 0817  NA 134*  K 3.9  CL 102  CO2 25  BUN 15  CREATININE 1.13  GLUCOSE 103*  CALCIUM 8.4*   No results for input(s): LABPT, INR in the last 72 hours.   EXAM General - Patient is Disorganized and Confused Extremity - Neurovascular intact Dorsiflexion/Plantar flexion intact Incision: scant drainage No cellulitis present Dressing/Incision - blood tinged drainage Motor Function - intact, moving foot and toes well on exam.  Unable to examine the patient's abdomen without him attempting to grab my stethoscope.  History reviewed. No pertinent past medical history.  Assessment/Plan: 1 Day Post-Op Procedure(s) (LRB): INTRAMEDULLARY (IM) NAIL INTERTROCHANTRIC (Left) Principal Problem:   Femur fracture, left (HCC) Active Problems:   CAD (coronary artery disease)   History of  transcatheter aortic valve replacement (TAVR)   Atrial fibrillation, chronic (HCC)   Chronic diastolic CHF (congestive heart failure) (HCC)   COPD (chronic obstructive pulmonary disease) (HCC)  Estimated body mass index is 23.91 kg/m as calculated from the following:   Height as of this encounter: 5\' 3"  (1.6 m).   Weight as of this encounter: 61.2 kg. Advance diet Up with therapy D/C IV fluids when tolerating po intake.  Labs reviewed this AM, Hg 12.4 this AM.  WBC 6.0 Patient is confused and combative, haldol ordered per internal medicine for agitation. Attempt to get up with therapy today if able. Begin working on BM.  DVT Prophylaxis - Lovenox and TED hose Weight-Bearing as tolerated to left leg  J. Cameron Proud, PA-C [facility] Orthopaedic Surgery 01/13/2021, 7:28 AM

## 2021-01-13 NOTE — TOC Progression Note (Addendum)
Transition of Care [facility]) - Progression Note    Patient Details  Name: Thomas Matthews MRN: [id number] Date of Birth: 11-01-1923  Transition of [facility] CM/SW [place], RN Phone Number: 01/13/2021, 9:52 AM  Clinical Narrative:   Spoke with Hilda Blades the patients daughter, she stated that he was going to  DC from [facility] today to go to [facility], The plan will remain that he will go to [facility] if he is able to walk with a RW with therapy, I explained that he would not work with Therapy this morning, She stated that her and her brother will encourage him to work with PT so we can see what the recommendation is   Update, The patient worked with PT and it is recommended that he go to [facility], I called to speak with [name] his daughter. Unable to reach, no family at the bedside, Left a VM for a call back     Update, Neoma Laming called me back and stated that they paid LC to hold the bed, I completed the FL2, PASSR and sent to [facility] in the Hub  Expected Discharge Plan and Services                                                 Social Determinants of Health (SDOH) Interventions    Readmission Risk Interventions No flowsheet data found.

## 2021-01-13 NOTE — Progress Notes (Signed)
Initial Nutrition Assessment  DOCUMENTATION CODES:   Not applicable  INTERVENTION:   -Advance diet to regular per MD -Ensure Enlive po BID, each supplement provides 350 kcal and 20 grams of protein  -MVI with minerals daily -Magic cup TID with meals, each supplement provides 290 kcal and 9 grams of protein   NUTRITION DIAGNOSIS:   Increased nutrient needs related to post-op healing as evidenced by estimated needs.  GOAL:   Patient will meet greater than or equal to 90% of their needs  MONITOR:   PO intake, Supplement acceptance, Labs, Weight trends, Skin, I & O's  REASON FOR ASSESSMENT:   Consult Assessment of nutrition requirement/status, Hip fracture protocol  ASSESSMENT:   Thomas Matthews is a 87 y.o. male with medical history significant for coronary artery disease, chronic diastolic dysfunction CHF with last known LVEF of 50 - 55%, chronic A. fib no longer on anticoagulation due to symptomatic anemia, history of severe aortic stenosis status post TAVR 2019, hypertension who was brought into the ER for evaluation after he fell at the skilled nursing facility where he resides.  Pt admitted with lt femur fracture s/p mechanical fall.   11/27- s/p Reduction and internal fixation of displaced intertrochanteric left hip fracture with Biomet Affixis TFN nail.  Reviewed I/O's: -634 ml x 24 hours   UOP: 1.7 L x 24 hours  Spoke with pt at bedside, who reports that he is "just trying to sleep". When asked where he was he states "I am in my chair bed". Pt did not otherwise interact with RD other than to tell this RD that her hands were cold when examining him.   Noted pt currently NPO. Case discussed with MD, who provided permission to advance diet.   Reviewed wt hx; pt has experienced a 11.1% wt loss over the past 3 months, which is significant for time frame.   Medications reviewed and include calcium with vitamin D, colace, lovenox, ferrous sulfate, lasix, and senokot.    Labs reviewed: Na: 130.    NUTRITION - FOCUSED PHYSICAL EXAM:  Flowsheet Row Most Recent Value  Orbital Region Mild depletion  Upper Arm Region No depletion  Thoracic and Lumbar Region No depletion  Buccal Region No depletion  Temple Region Mild depletion  Clavicle Bone Region No depletion  Clavicle and Acromion Bone Region No depletion  Scapular Bone Region No depletion  Dorsal Hand No depletion  Patellar Region No depletion  Anterior Thigh Region No depletion  Posterior Calf Region No depletion  Edema (RD Assessment) Mild  Hair Reviewed  Eyes Reviewed  Mouth Reviewed  Skin Reviewed  Nails Reviewed       Diet Order:   Diet Order             Diet regular Room service appropriate? Yes; Fluid consistency: Thin  Diet effective now                   EDUCATION NEEDS:   Not appropriate for education at this time  Skin:  Skin Assessment: Skin Integrity Issues: Skin Integrity Issues:: Incisions Incisions: closed lt hip  Last BM:  01/11/21  Height:   Ht Readings from Last 1 Encounters:  01/12/21 5\' 3"  (1.6 m)    Weight:   Wt Readings from Last 1 Encounters:  01/12/21 61.2 kg    Ideal Body Weight:  56.4 kg  BMI:  Body mass index is 23.91 kg/m.  Estimated Nutritional Needs:   Kcal:  1650-1850  Protein:  80-95  grams  Fluid:  > 1.6 L    Loistine Chance, RD, LDN, [place] Registered Dietitian II Certified Diabetes Care and Education Specialist Please refer to [facility] for RD and/or RD on-call/weekend/after hours pager

## 2021-01-13 NOTE — Progress Notes (Signed)
Pt refusing to receive care this morning. RN and NT attempted multiple times to enter patient's room to provider care but patient kept yelling, "get the hell out of here and close that door". RN and NT was able to distract the perform bladder scan which showed 280ml in the bladder. Pt did require in and out cath earlier in the shift for retention of >866ml. Pt was agitated and was trying to get out of bed and pulled his PIV out around 2245. He was sitting at the edge of the bed. Pt repositioned in the bed. Provider was paged, and prn order for haldol obtained but none given. Pt was able to calm down with verbal reassurance and frequent contact. No issues noted until this morning. When patient was awoken up by RN to take him AM tylenol and bladder scan. Pt became agitated again.

## 2021-01-13 NOTE — NC FL2 (Signed)
[place] LEVEL OF CARE SCREENING TOOL     IDENTIFICATION  Patient Name: Thomas Matthews Birthdate: 12/03/1923 Sex: male Admission Date (Current Location): 01/12/2021  [facility] and [place] Number:  Engineering geologist and Address:  [facility], [address] [place], [place] [address]      Provider Number: [id number]  Attending Physician Name and Address:  Caren Griffins, MD  Relative Name and Phone Number:  Merleen Nicely [phone]    Current Level of Care: Hospital Recommended Level of Care: [place] Prior Approval Number:    Date Approved/Denied:   PASRR Number: [id number]  Discharge Plan: SNF    Current Diagnoses: Patient Active Problem List   Diagnosis Date Noted   H/O sick sinus syndrome 01/13/2021   BPH (benign prostatic hyperplasia) 01/13/2021   Femur fracture, left ([place]) 01/12/2021   Atrial fibrillation, chronic ([place]) 01/12/2021   Chronic diastolic CHF (congestive heart failure) ([place]) 01/12/2021   COPD (chronic obstructive pulmonary disease) ([place]) 01/12/2021   Protein-calorie malnutrition, severe 12/14/2020   Pressure injury of skin 12/13/2020   NSVT (nonsustained ventricular tachycardia) 12/11/2020   S/P TAVR (transcatheter aortic valve replacement) 12/11/2020   Acid reflux 07/27/2017   Acne erythematosa 07/27/2017   Flutter-fibrillation 07/27/2017   BP (high blood pressure) 07/27/2017   Cardiac pacemaker in situ 07/27/2017   CAD (coronary artery disease) 07/27/2017   DD (diverticular disease) 07/27/2017   History of GI bleed 04/16/2008   Benign neoplasm of colon 02/27/2004    Orientation RESPIRATION BLADDER Height & Weight     Self, Place  Normal Continent, External catheter Weight: 61.2 kg Height:  5\' 3"  (160 cm)  BEHAVIORAL SYMPTOMS/MOOD NEUROLOGICAL BOWEL NUTRITION STATUS      Continent Diet (regular)  AMBULATORY STATUS COMMUNICATION OF NEEDS Skin   Extensive Assist   Normal,  Surgical wounds                       Personal Care Assistance Level of Assistance  Bathing, Feeding, Dressing Bathing Assistance: Limited assistance Feeding assistance: Independent Dressing Assistance: Limited assistance     Functional Limitations Info             SPECIAL CARE FACTORS FREQUENCY  PT (By licensed PT)     PT Frequency: 5 times per week              Contractures Contractures Info: Not present    Additional Factors Info  Code Status, Allergies Code Status Info: DNR Allergies Info: Amlodipine Besylate, Felodipine, Oxybutynin Chloride, Simvastatin, Terazosin           Current Medications (01/13/2021):  This is the current hospital active medication list Current Facility-Administered Medications  Medication Dose Route Frequency Provider Last Rate Last Admin   acetaminophen (TYLENOL) tablet 325-650 mg  325-650 mg Oral Q6H PRN Poggi, Marshall Cork, MD       acetaminophen (TYLENOL) tablet 500 mg  500 mg Oral Q6H Poggi, Marshall Cork, MD   500 mg at 01/13/21 1200   amiodarone (PACERONE) tablet 200 mg  200 mg Oral BID Corky Mull, MD   200 mg at 01/13/21 0955   bisacodyl (DULCOLAX) suppository 10 mg  10 mg Rectal Daily PRN Poggi, Marshall Cork, MD       calcium-vitamin D (OSCAL WITH D) 500-5 MG-MCG per tablet 2 tablet  2 tablet Oral Daily Poggi, Marshall Cork, MD   2 tablet at 01/13/21 0954   diphenhydrAMINE (BENADRYL) 12.5 MG/5ML  elixir 12.5-25 mg  12.5-25 mg Oral Q4H PRN Poggi, Marshall Cork, MD       docusate sodium (COLACE) capsule 100 mg  100 mg Oral BID Poggi, Marshall Cork, MD   100 mg at 01/13/21 0954   enoxaparin (LOVENOX) injection 40 mg  40 mg Subcutaneous Q24H Poggi, Marshall Cork, MD   40 mg at 01/13/21 1001   ferrous sulfate tablet 325 mg  325 mg Oral BID WC Poggi, Marshall Cork, MD   325 mg at 01/13/21 0955   fesoterodine (TOVIAZ) tablet 4 mg  4 mg Oral Daily Poggi, Marshall Cork, MD   4 mg at 01/13/21 7425   furosemide (LASIX) tablet 40 mg  40 mg Oral Daily Poggi, Marshall Cork, MD   40 mg at 01/13/21 0954    haloperidol lactate (HALDOL) injection 1 mg  1 mg Intramuscular Q6H PRN Mansy, Jan A, MD       HYDROcodone-acetaminophen (NORCO) 7.5-325 MG per tablet 1-2 tablet  1-2 tablet Oral Q4H PRN Poggi, Marshall Cork, MD   1 tablet at 01/12/21 2019   HYDROcodone-acetaminophen (NORCO/VICODIN) 5-325 MG per tablet 1-2 tablet  1-2 tablet Oral Q6H PRN Poggi, Marshall Cork, MD       magnesium hydroxide (MILK OF MAGNESIA) suspension 30 mL  30 mL Oral Daily PRN Poggi, Marshall Cork, MD       methocarbamol (ROBAXIN) tablet 500 mg  500 mg Oral Q6H PRN Poggi, Marshall Cork, MD       Or   methocarbamol (ROBAXIN) 500 mg in dextrose 5 % 50 mL IVPB  500 mg Intravenous Q6H PRN Poggi, Marshall Cork, MD       metoCLOPramide (REGLAN) tablet 5-10 mg  5-10 mg Oral Q8H PRN Poggi, Marshall Cork, MD       Or   metoCLOPramide (REGLAN) injection 5-10 mg  5-10 mg Intravenous Q8H PRN Poggi, Marshall Cork, MD       metoprolol tartrate (LOPRESSOR) tablet 50 mg  50 mg Oral BID Poggi, Marshall Cork, MD   50 mg at 01/13/21 0954   morphine 2 MG/ML injection 0.5 mg  0.5 mg Intravenous Q2H PRN Poggi, Marshall Cork, MD       ondansetron [facility]) tablet 4 mg  4 mg Oral Q6H PRN Poggi, Marshall Cork, MD       Or   ondansetron (ZOFRAN) injection 4 mg  4 mg Intravenous Q6H PRN Poggi, Marshall Cork, MD       oxybutynin (DITROPAN-XL) 24 hr tablet 10 mg  10 mg Oral QHS Poggi, Marshall Cork, MD   10 mg at 01/12/21 2331   pantoprazole (PROTONIX) EC tablet 40 mg  40 mg Oral Daily Poggi, Marshall Cork, MD   40 mg at 01/13/21 0954   pravastatin (PRAVACHOL) tablet 20 mg  20 mg Oral Daily Poggi, Marshall Cork, MD   20 mg at 01/13/21 9563   senna (SENOKOT) tablet 8.6 mg  1 tablet Oral BID Corky Mull, MD   8.6 mg at 01/13/21 0954   sodium phosphate (FLEET) 7-19 GM/118ML enema 1 enema  1 enema Rectal Once PRN Poggi, Marshall Cork, MD       tamsulosin [facility]) capsule 0.4 mg  0.4 mg Oral QPC breakfast Poggi, Marshall Cork, MD   0.4 mg at 01/13/21 8756     Discharge Medications: Please see discharge summary for a list of discharge medications.  Relevant  Imaging Results:  Relevant Lab Results:   Additional Information SSN#  [id number]  Conception Oms, RN

## 2021-01-13 NOTE — Progress Notes (Signed)
PROGRESS NOTE  Thomas Matthews:[id number] DOB: [birth year] DOA: 01/12/2021 PCP: Birdie Sons, MD   LOS: 1 day   Brief Narrative / Interim history: 87 year old male with history of CAD, chronic diastolic CHF, COPD, chronic A. fib not on anticoagulation due to symptomatic anemia, severe AAS status post TAVR 2019, hypertension, SSS s/p PCM, pulmonary hypertension here with hip fracture after falling in the bathroom landing on his left side.  Orthopedic surgery was consulted and he was operated on 11/27.  Subjective / 24h Interval events: Confused this morning, he thinks he is at home and tells me to get "these people out of my house".  Assessment & Plan: Principal Problem:   Femur fracture, left (HCC) Active Problems:   Cardiac pacemaker in situ   CAD (coronary artery disease)   S/P TAVR (transcatheter aortic valve replacement)   Atrial fibrillation, chronic (HCC)   Chronic diastolic CHF (congestive heart failure) (HCC)   COPD (chronic obstructive pulmonary disease) (HCC)   H/O sick sinus syndrome   BPH (benign prostatic hyperplasia)  Principal Problem Close displaced intertrochanteric fracture, left hip-orthopedic surgery consulted, he is status post reduction internal fixation of displacement left hip fracture with [employer] TFN nail. -Monitor postop, DVT prophylaxis per orthopedic surgery with Lovenox, PT/OT evaluation pending  Active Problems Acute metabolic encephalopathy-discussed with the daughter over the phone, she tells me that once in a while he may have some mild memory issues but for most part he is quite sharp and there is no history of diagnosed dementia.  She denies episodes of sundowning or confusion and night when he was hospitalized a month ago.  Continue to monitor, this may be postop/use of pain medications  Chronic atrial fibrillation-continue amiodarone, metoprolol, no longer on anticoagulation due to recent GI bleed requiring transfusions  History  of CAD-continue metoprolol, statin  Chronic diastolic CHF-stop IV fluids, continue furosemide, metoprolol  History of severe AS status post TAVR-stable  Sick sinus syndrome-pacemaker in place  BPH-continue tamsulosin  History of COPD-stable, no wheezing  Scheduled Meds:  acetaminophen  500 mg Oral Q6H   amiodarone  200 mg Oral BID   calcium-vitamin D  2 tablet Oral Daily   docusate sodium  100 mg Oral BID   enoxaparin (LOVENOX) injection  40 mg Subcutaneous Q24H   ferrous sulfate  325 mg Oral BID WC   fesoterodine  4 mg Oral Daily   furosemide  40 mg Oral Daily   metoprolol tartrate  50 mg Oral BID   oxybutynin  10 mg Oral QHS   pantoprazole  40 mg Oral Daily   pravastatin  20 mg Oral Daily   senna  1 tablet Oral BID   tamsulosin  0.4 mg Oral QPC breakfast   Continuous Infusions:  sodium chloride 50 mL/hr at 01/12/21 2033   methocarbamol (ROBAXIN) IV     PRN Meds:.acetaminophen, bisacodyl, diphenhydrAMINE, haloperidol lactate, HYDROcodone-acetaminophen, HYDROcodone-acetaminophen, magnesium hydroxide, methocarbamol **OR** methocarbamol (ROBAXIN) IV, metoCLOPramide **OR** metoCLOPramide (REGLAN) injection, morphine injection, ondansetron **OR** ondansetron (ZOFRAN) IV, sodium phosphate    DVT prophylaxis: enoxaparin (LOVENOX) injection 40 mg Start: 01/13/21 0800 SCDs Start: 01/12/21 1832     Code Status: DNR  Family Communication: Daughter over the phone  Status is: Inpatient  Remains inpatient appropriate because: delirium, post op  Level of care: Med-Surg  Consultants:  General surgery   Procedures:  Hip fx repait  Microbiology  none  Antimicrobials: none    Objective: Vitals:   01/12/21 2054 01/12/21 2330 01/13/21 0452 01/13/21  0840  BP: 128/69 133/68 105/63 (!) 102/53  Pulse: 67 91 (!) 59 65  Resp: 18 18 18 16   Temp: 97.8 F (36.6 C) 97.6 F (36.4 C) 97.8 F (36.6 C) 98.2 F (36.8 C)  TempSrc:      SpO2:  96% 94% 94%  Weight:      Height:         Intake/Output Summary (Last 24 hours) at 01/13/2021 1033 Last data filed at 01/13/2021 0305 Gross per 24 hour  Intake 1116.43 ml  Output 1750 ml  Net -633.57 ml   Filed Weights   01/12/21 0908  Weight: 61.2 kg    Examination:  Constitutional: NAD Eyes: no scleral icterus ENMT: Mucous membranes are moist.  Neck: normal, supple Respiratory: clear to auscultation bilaterally, no wheezing, no crackles. Normal respiratory effort. No accessory muscle use.  Cardiovascular: Regular rate and rhythm, no murmurs / rubs / gallops. No LE edema.  Abdomen: non distended, no tenderness. Bowel sounds positive.  Musculoskeletal: no clubbing / cyanosis.  Skin: no rashes Neurologic: non focal    Data Reviewed: I have independently reviewed following labs and imaging studies  CBC: Recent Labs  Lab 01/12/21 0817 01/13/21 0851  WBC 6.0 10.9*  HGB 12.4* 8.5*  HCT 37.1* 24.9*  MCV 99.7 100.0  PLT 145* 419*   Basic Metabolic Panel: Recent Labs  Lab 01/12/21 0817 01/13/21 0851  NA 134* 130*  K 3.9 4.1  CL 102 101  CO2 25 25  GLUCOSE 103* 113*  BUN 15 18  CREATININE 1.13 0.99  CALCIUM 8.4* 7.4*   Liver Function Tests: No results for input(s): AST, ALT, ALKPHOS, BILITOT, PROT, ALBUMIN in the last 168 hours. Coagulation Profile: No results for input(s): INR, PROTIME in the last 168 hours. HbA1C: No results for input(s): HGBA1C in the last 72 hours. CBG: No results for input(s): GLUCAP in the last 168 hours.  Recent Results (from the past 240 hour(s))  Resp Panel by RT-PCR (Flu A&B, Covid) Nasopharyngeal Swab     Status: None   Collection Time: 01/12/21  9:10 AM   Specimen: Nasopharyngeal Swab; Nasopharyngeal(NP) swabs in vial transport medium  Result Value Ref Range Status   SARS Coronavirus 2 by RT PCR NEGATIVE NEGATIVE Final    Comment: (NOTE) SARS-CoV-2 target nucleic acids are NOT DETECTED.  The SARS-CoV-2 RNA is generally detectable in upper  respiratory specimens during the acute phase of infection. The lowest concentration of SARS-CoV-2 viral copies this assay can detect is 138 copies/mL. A negative result does not preclude SARS-Cov-2 infection and should not be used as the sole basis for treatment or other patient management decisions. A negative result may occur with  improper specimen collection/handling, submission of specimen other than nasopharyngeal swab, presence of viral mutation(s) within the areas targeted by this assay, and inadequate number of viral copies(<138 copies/mL). A negative result must be combined with clinical observations, patient history, and epidemiological information. The expected result is Negative.  Fact Sheet for Patients:  EntrepreneurPulse.com.au  Fact Sheet for Healthcare Providers:  IncredibleEmployment.be  This test is no t yet approved or cleared by the [place] FDA and  has been authorized for detection and/or diagnosis of SARS-CoV-2 by FDA under an Emergency Use Authorization (EUA). This EUA will remain  in effect (meaning this test can be used) for the duration of the COVID-19 declaration under Section 564(b)(1) of the Act, 21 U.S.C.section 360bbb-3(b)(1), unless the authorization is terminated  or revoked sooner.  Influenza A by PCR NEGATIVE NEGATIVE Final   Influenza B by PCR NEGATIVE NEGATIVE Final    Comment: (NOTE) The Xpert Xpress SARS-CoV-2/FLU/RSV plus assay is intended as an aid in the diagnosis of influenza from Nasopharyngeal swab specimens and should not be used as a sole basis for treatment. Nasal washings and aspirates are unacceptable for Xpert Xpress SARS-CoV-2/FLU/RSV testing.  Fact Sheet for Patients: EntrepreneurPulse.com.au  Fact Sheet for Healthcare Providers: IncredibleEmployment.be  This test is not yet approved or cleared by the [place] FDA and has been  authorized for detection and/or diagnosis of SARS-CoV-2 by FDA under an Emergency Use Authorization (EUA). This EUA will remain in effect (meaning this test can be used) for the duration of the COVID-19 declaration under Section 564(b)(1) of the Act, 21 U.S.C. section 360bbb-3(b)(1), unless the authorization is terminated or revoked.  Performed at [facility], [address] [place], [place] [address]      Radiology Studies: DG C-Arm 1-60 Min-No Report  Result Date: 01/12/2021 Fluoroscopy was utilized by the requesting physician.  No radiographic interpretation.   DG HIP UNILAT WITH PELVIS 2-3 VIEWS LEFT  Result Date: 01/12/2021 CLINICAL DATA:  Placement of left intramedullary nail. EXAM: DG HIP (WITH OR WITHOUT PELVIS) 2-3V LEFT COMPARISON:  Earlier same day left hip radiograph at 0823 hours FINDINGS: Fluoroscopic images were obtained intraoperatively and submitted for post operative interpretation. 6 images were obtained with 60 seconds of fluoroscopy time. Provided images demonstrate placement of left intramedullary rod with proximal lag screw and distal fixation screw. Hardware is in appropriate position. Please see the performing provider's procedural report for further detail. IMPRESSION: Intraoperative imaging for placement of left intramedullary nail. Please see performing provider's procedural report for further details. Electronically Signed   By: Ileana Roup M.D.   On: 01/12/2021 16:31     Marzetta Board, MD, PhD [facility]  Between 7 am - 7 pm I am available, please contact me via Amion (for emergencies) or Securechat (non urgent messages)  Between 7 pm - 7 am I am not available, please contact night coverage MD/APP via Amion

## 2021-01-13 NOTE — Evaluation (Signed)
Physical Therapy Evaluation Patient Details Name: Thomas Matthews MRN: [id number] DOB: 1923/07/18 Today's Date: 01/13/2021  History of Present Illness  presented ot ER secondary fall in STR, acute onset of L hip pain; admitted for management of L femur fracture, s/p ORIF (01/12/21), WBAT.  Clinical Impression  Patient resting in bed upon arrival to room; son and daughter present at bedside. Patient generally restless, now constantly attempting to get OOB; limited recall/awareness of location, situation and need for assist with all mobility tassk.  FACES pain scale indicates pain 4/10 L LE, grimacing with movement, but able to actively mobilize in all planes with increased time/effort. Currently requiring mod assist for bed mobility; mod/max assist for sit/stand, standing balance and bed/chair transfer with RW.  Maintains forward flexed posture, heavy WBing on RW (constant cuing for position/negotiation with transfer); significant buckling (and spontaneous attempts at sitting) with initial attempts at loading L LE, max assist to recover and maintain balance.  Additional gait deferred as result; will continue to assess/progress in subsequent sessions. Of note, patient resting sats 84-85% on 1L upon arrival to room; requiring increase to 3L with transfers to maintain sats with mobility efforts.  Left on 3L end of session; RN informed/aware. Would benefit from skilled PT to address above deficits and promote optimal return to PLOF.; recommend transition to STR upon discharge from acute hospitalization.       Recommendations for follow up therapy are one component of a multi-disciplinary discharge planning process, led by the attending physician.  Recommendations may be updated based on patient status, additional functional criteria and insurance authorization.  Follow Up Recommendations Skilled nursing-short term rehab (<3 hours/day)    Assistance Recommended at Discharge Frequent or constant  Supervision/Assistance  Functional Status Assessment Patient has had a recent decline in their functional status and demonstrates the ability to make significant improvements in function in a reasonable and predictable amount of time.  Equipment Recommendations  Rolling walker (2 wheels);BSC/3in1    Recommendations for Other Services       Precautions / Restrictions Precautions Precautions: Fall Restrictions Weight Bearing Restrictions: Yes LLE Weight Bearing: Weight bearing as tolerated      Mobility  Bed Mobility Overal bed mobility: Needs Assistance Bed Mobility: Supine to Sit     Supine to sit: Mod assist          Transfers Overall transfer level: Needs assistance Equipment used: Rolling walker (2 wheels) Transfers: Sit to/from Stand;Bed to chair/wheelchair/BSC Sit to Stand: Mod assist;Max assist Stand pivot transfers: Max assist         General transfer comment: assist for lift off, standing balance; L LE buckling with loading attempts, extensive assist to recover and prevent fall.    Ambulation/Gait               General Gait Details: unsafe/unable due to limited tolerance for WBing L LE  Stairs            Wheelchair Mobility    Modified Rankin (Stroke Patients Only)       Balance Overall balance assessment: Needs assistance Sitting-balance support: No upper extremity supported;Feet supported Sitting balance-Leahy Scale: Fair     Standing balance support: Bilateral upper extremity supported Standing balance-Leahy Scale: Poor                               Pertinent Vitals/Pain Pain Assessment: Faces Faces Pain Scale: Hurts little more Pain Location: L hip Pain Descriptors /  Indicators: Aching;Grimacing;Guarding Pain Intervention(s): Limited activity within patient's tolerance;Monitored during session;Repositioned    Home Living Family/patient expects to be discharged to:: Skilled nursing facility                    Additional Comments: At baseline, lives home alone, active without assist device.  Since hospitalization approx 4 weeks prior, has been in STR.  Planned for transition to ALF, but fell day prior to planned discharge.    Prior Function Prior Level of Function : Independent/Modified Independent             Mobility Comments: Mod indep at true baseline; has been using RW for mobility since recent hospitalization ADLs Comments: Family assists with meals (although pt able to boil eggs per dtr), set up meds. Pt still drives, is forgetful but does very well with set routines. Indep with bathing, dressing.     Hand Dominance        Extremity/Trunk Assessment   Upper Extremity Assessment Upper Extremity Assessment: Overall WFL for tasks assessed    Lower Extremity Assessment Lower Extremity Assessment:  (L hip grossly 3-/5, limited by pain; otherwise, LEs at least 4-/5 throughout)       Communication   Communication: HOH  Cognition Arousal/Alertness: Awake/alert Behavior During Therapy: Restless Overall Cognitive Status: Difficult to assess                                 General Comments: Oriented to self only; reoriented to location, situation (multiple times during session) with minimal carry-over.  Generally restless and impulsive.        General Comments      Exercises Other Exercises Other Exercises: Educated patient/family in role of PT and progressive mobility, safety precautions, level of assist, anticipated course of rehab; patient/family voiced understanding. Other Exercises: Sit/stand x3 with RW, mod/max assist for lift off and balance   Assessment/Plan    PT Assessment Patient needs continued PT services  PT Problem List Decreased strength;Decreased range of motion;Decreased activity tolerance;Decreased balance;Decreased mobility;Decreased knowledge of precautions;Decreased coordination;Decreased cognition;Decreased knowledge of use of  DME;Decreased safety awareness;Decreased skin integrity;Pain       PT Treatment Interventions DME instruction;Gait training;Stair training;Functional mobility training;Therapeutic activities;Therapeutic exercise;Balance training;Patient/family education;Cognitive remediation    PT Goals (Current goals can be found in the Care Plan section)  Acute Rehab PT Goals Patient Stated Goal: to return to rehab and then transition to ALF PT Goal Formulation: With patient/family Time For Goal Achievement: 01/27/21 Potential to Achieve Goals: Fair    Frequency 7X/week   Barriers to discharge        Co-evaluation               AM-PAC PT "6 Clicks" Mobility  Outcome Measure Help needed turning from your back to your side while in a flat bed without using bedrails?: A Lot Help needed moving from lying on your back to sitting on the side of a flat bed without using bedrails?: A Lot Help needed moving to and from a bed to a chair (including a wheelchair)?: A Lot Help needed standing up from a chair using your arms (e.g., wheelchair or bedside chair)?: A Lot Help needed to walk in hospital room?: Total Help needed climbing 3-5 steps with a railing? : Total 6 Click Score: 10    End of Session Equipment Utilized During Treatment: Gait belt;Oxygen Activity Tolerance: Patient tolerated treatment well Patient left: in  chair;with call bell/phone within reach;with chair alarm set;with family/visitor present Nurse Communication: Mobility status PT Visit Diagnosis: Muscle weakness (generalized) (M62.81);Difficulty in walking, not elsewhere classified (R26.2);Pain Pain - Right/Left: Left Pain - part of body: Hip    Time: 9179-1505 PT Time Calculation (min) (ACUTE ONLY): 31 min   Charges:   PT Evaluation $PT Eval Moderate Complexity: 1 Mod PT Treatments $Therapeutic Activity: 8-22 mins       Lyndall Bellot H. Owens Shark, PT, DPT, NCS 01/13/21, 12:35 PM [phone]

## 2021-01-13 NOTE — Progress Notes (Signed)
OT Cancellation Note  Patient Details Name: Thomas Matthews MRN: [id number] DOB: 06-Nov-1923   Cancelled Treatment:    Reason Eval/Treat Not Completed: Patient declined, no reason specified. Order received and chart reviewed. Upon arrival pt sleeping in bed, awakes to light touch and repeatedly states "get out" and requesting to be left alone to sleep. Will continue to follow and initiate services at later date/time as pt available.   Dessie Coma, M.S. OTR/L  01/13/21, 3:02 PM  ascom [phone]

## 2021-01-14 DIAGNOSIS — S7292XA Unspecified fracture of left femur, initial encounter for closed fracture: Secondary | ICD-10-CM | POA: Diagnosis not present

## 2021-01-14 DIAGNOSIS — N400 Enlarged prostate without lower urinary tract symptoms: Secondary | ICD-10-CM | POA: Diagnosis not present

## 2021-01-14 DIAGNOSIS — I482 Chronic atrial fibrillation, unspecified: Secondary | ICD-10-CM | POA: Diagnosis not present

## 2021-01-14 DIAGNOSIS — I2510 Atherosclerotic heart disease of native coronary artery without angina pectoris: Secondary | ICD-10-CM | POA: Diagnosis not present

## 2021-01-14 LAB — COMPREHENSIVE METABOLIC PANEL
ALT: 5 U/L (ref 0–44)
AST: 16 U/L (ref 15–41)
Albumin: 2.5 g/dL — ABNORMAL LOW (ref 3.5–5.0)
Alkaline Phosphatase: 46 U/L (ref 38–126)
Anion gap: 7 (ref 5–15)
BUN: 22 mg/dL (ref 8–23)
CO2: 23 mmol/L (ref 22–32)
Calcium: 7.5 mg/dL — ABNORMAL LOW (ref 8.9–10.3)
Chloride: 99 mmol/L (ref 98–111)
Creatinine, Ser: 1.2 mg/dL (ref 0.61–1.24)
GFR, Estimated: 56 mL/min — ABNORMAL LOW (ref >60)
Glucose, Bld: 104 mg/dL — ABNORMAL HIGH (ref 70–99)
Potassium: 3.7 mmol/L (ref 3.5–5.1)
Sodium: 129 mmol/L — ABNORMAL LOW (ref 135–145)
Total Bilirubin: 0.6 mg/dL (ref 0.3–1.2)
Total Protein: 5 g/dL — ABNORMAL LOW (ref 6.5–8.1)

## 2021-01-14 LAB — CBC
HCT: 22.4 % — ABNORMAL LOW (ref 39.0–52.0)
Hemoglobin: 7.8 g/dL — ABNORMAL LOW (ref 13.0–17.0)
MCH: 34.1 pg — ABNORMAL HIGH (ref 26.0–34.0)
MCHC: 34.8 g/dL (ref 30.0–36.0)
MCV: 97.8 fL (ref 80.0–100.0)
Platelets: 99 10*3/uL — ABNORMAL LOW (ref 150–400)
RBC: 2.29 MIL/uL — ABNORMAL LOW (ref 4.22–5.81)
RDW: 16.9 % — ABNORMAL HIGH (ref 11.5–15.5)
WBC: 8.1 10*3/uL (ref 4.0–10.5)
nRBC: 0 % (ref 0.0–0.2)

## 2021-01-14 MED ORDER — CHLORHEXIDINE GLUCONATE CLOTH 2 % EX PADS
6.0000 | MEDICATED_PAD | Freq: Every day | CUTANEOUS | Status: DC
  Administered 2021-01-14 – 2021-01-15 (×2): 6 via TOPICAL

## 2021-01-14 NOTE — Progress Notes (Signed)
PROGRESS NOTE  Thomas Matthews OZH:[id number] DOB: 05-03-1923 DOA: 01/12/2021 PCP: Birdie Sons, MD   LOS: 2 days   Brief Narrative / Interim history: 87 year old male with history of CAD, chronic diastolic CHF, COPD, chronic A. fib not on anticoagulation due to symptomatic anemia, severe AAS status post TAVR 2019, hypertension, SSS s/p PCM, pulmonary hypertension here with hip fracture after falling in the bathroom landing on his left side.  Orthopedic surgery was consulted and he was operated on 11/27.  Postop course complicated by delirium and confusion on 11/28 resolved on 11/29  Subjective / 24h Interval events: much more pleasant this morning, asks me to assist him with a walker he wants to take a walk.  Denies any shortness of breath, denies any chest pain  Assessment & Plan: Principal Problem:   Femur fracture, left (HCC) Active Problems:   Cardiac pacemaker in situ   CAD (coronary artery disease)   S/P TAVR (transcatheter aortic valve replacement)   Atrial fibrillation, chronic (HCC)   Chronic diastolic CHF (congestive heart failure) (HCC)   COPD (chronic obstructive pulmonary disease) (HCC)   H/O sick sinus syndrome   BPH (benign prostatic hyperplasia)  Principal Problem Close displaced intertrochanteric fracture, left hip-orthopedic surgery consulted, he is status post reduction internal fixation of displacement left hip fracture with [employer] TFN nail. -Monitor postop, DVT prophylaxis per orthopedic surgery with Lovenox, PT/OT evaluation recommending SNF, placement pending  Active Problems Acute metabolic encephalopathy-discussed with the daughter over the phone, she tells me that once in a while he may have some mild memory issues but for most part he is quite sharp and there is no history of diagnosed dementia.  She denies episodes of sundowning or confusion and night when he was hospitalized a month ago.  His confusion was likely postoperatively, quite clear  this morning and appropriate, seems to have resolved  Acute blood loss anemia-postoperatively, hemoglobin 7.8 this morning, maintain above 7, no need for transfusions today.  Continue to monitor.  Chronic atrial fibrillation-continue amiodarone, metoprolol, no longer on anticoagulation due to recent GI bleed requiring transfusions  History of CAD-continue metoprolol, statin  Chronic diastolic CHF-stop IV fluids, continue furosemide, metoprolol  History of severe AS status post TAVR-stable  Sick sinus syndrome-pacemaker in place  BPH-continue tamsulosin  History of COPD-stable, no wheezing  Scheduled Meds:  amiodarone  200 mg Oral BID   calcium-vitamin D  2 tablet Oral Daily   docusate sodium  100 mg Oral BID   enoxaparin (LOVENOX) injection  40 mg Subcutaneous Q24H   feeding supplement  237 mL Oral BID BM   ferrous sulfate  325 mg Oral BID WC   fesoterodine  4 mg Oral Daily   furosemide  40 mg Oral Daily   metoprolol tartrate  50 mg Oral BID   multivitamin with minerals  1 tablet Oral Daily   oxybutynin  10 mg Oral QHS   pantoprazole  40 mg Oral Daily   pravastatin  20 mg Oral Daily   senna  1 tablet Oral BID   tamsulosin  0.4 mg Oral QPC breakfast   Continuous Infusions:  methocarbamol (ROBAXIN) IV     PRN Meds:.acetaminophen, bisacodyl, diphenhydrAMINE, haloperidol lactate, HYDROcodone-acetaminophen, HYDROcodone-acetaminophen, magnesium hydroxide, methocarbamol **OR** methocarbamol (ROBAXIN) IV, metoCLOPramide **OR** metoCLOPramide (REGLAN) injection, morphine injection, ondansetron **OR** ondansetron (ZOFRAN) IV, sodium phosphate  Diet Orders (From admission, onward)     Start     Ordered   01/13/21 1454  Diet regular Room service appropriate? Yes;  Fluid consistency: Thin  Diet effective now       Question Answer Comment  Room service appropriate? Yes   Fluid consistency: Thin      01/13/21 1453            DVT prophylaxis: enoxaparin (LOVENOX) injection 40  mg Start: 01/13/21 0800 SCDs Start: 01/12/21 1832     Code Status: DNR  Family Communication: Daughter over the phone  Status is: Inpatient  Remains inpatient appropriate because: delirium, post op  Level of care: Med-Surg  Consultants:  General surgery   Procedures:  Hip fx repait  Microbiology  none  Antimicrobials: none    Objective: Vitals:   01/13/21 1554 01/13/21 1941 01/14/21 0514 01/14/21 0731  BP: (!) 114/51 (!) 109/58 107/71 121/73  Pulse: 69 72 64 69  Resp: 16 17 17 16   Temp: 97.8 F (36.6 C) 97.9 F (36.6 C) 98.2 F (36.8 C) 98.2 F (36.8 C)  TempSrc:      SpO2: 99% 100% 98% 100%  Weight:      Height:        Intake/Output Summary (Last 24 hours) at 01/14/2021 1119 Last data filed at 01/14/2021 0951 Gross per 24 hour  Intake 240 ml  Output 975 ml  Net -735 ml    Filed Weights   01/12/21 0908  Weight: 61.2 kg    Examination:  Constitutional: No distress Eyes: Anicteric ENMT: mmm  Neck: normal, supple Respiratory: Clear bilaterally, no wheezing, normal respiratory effort Cardiovascular: Regular rate and rhythm, no murmurs, no peripheral edema Abdomen: Soft, NT, ND, bowel sounds positive.  Musculoskeletal: no clubbing / cyanosis.  Skin: No rash appreciated Neurologic: Nonfocal, equal strength   Data Reviewed: I have independently reviewed following labs and imaging studies  CBC: Recent Labs  Lab 01/12/21 0817 01/13/21 0851 01/14/21 0153  WBC 6.0 10.9* 8.1  HGB 12.4* 8.5* 7.8*  HCT 37.1* 24.9* 22.4*  MCV 99.7 100.0 97.8  PLT 145* 108* 99*    Basic Metabolic Panel: Recent Labs  Lab 01/12/21 0817 01/13/21 0851 01/14/21 0153  NA 134* 130* 129*  K 3.9 4.1 3.7  CL 102 101 99  CO2 25 25 23   GLUCOSE 103* 113* 104*  BUN 15 18 22   CREATININE 1.13 0.99 1.20  CALCIUM 8.4* 7.4* 7.5*    Liver Function Tests: Recent Labs  Lab 01/14/21 0153  AST 16  ALT 5  ALKPHOS 46  BILITOT 0.6  PROT 5.0*  ALBUMIN 2.5*    Coagulation Profile: No results for input(s): INR, PROTIME in the last 168 hours. HbA1C: No results for input(s): HGBA1C in the last 72 hours. CBG: No results for input(s): GLUCAP in the last 168 hours.  Recent Results (from the past 240 hour(s))  Resp Panel by RT-PCR (Flu A&B, Covid) Nasopharyngeal Swab     Status: None   Collection Time: 01/12/21  9:10 AM   Specimen: Nasopharyngeal Swab; Nasopharyngeal(NP) swabs in vial transport medium  Result Value Ref Range Status   SARS Coronavirus 2 by RT PCR NEGATIVE NEGATIVE Final    Comment: (NOTE) SARS-CoV-2 target nucleic acids are NOT DETECTED.  The SARS-CoV-2 RNA is generally detectable in upper respiratory specimens during the acute phase of infection. The lowest concentration of SARS-CoV-2 viral copies this assay can detect is 138 copies/mL. A negative result does not preclude SARS-Cov-2 infection and should not be used as the sole basis for treatment or other patient management decisions. A negative result may occur with  improper specimen collection/handling, submission  of specimen other than nasopharyngeal swab, presence of viral mutation(s) within the areas targeted by this assay, and inadequate number of viral copies(<138 copies/mL). A negative result must be combined with clinical observations, patient history, and epidemiological information. The expected result is Negative.  Fact Sheet for Patients:  EntrepreneurPulse.com.au  Fact Sheet for Healthcare Providers:  IncredibleEmployment.be  This test is no t yet approved or cleared by the [place] FDA and  has been authorized for detection and/or diagnosis of SARS-CoV-2 by FDA under an Emergency Use Authorization (EUA). This EUA will remain  in effect (meaning this test can be used) for the duration of the COVID-19 declaration under Section 564(b)(1) of the Act, 21 U.S.C.section 360bbb-3(b)(1), unless the authorization is  terminated  or revoked sooner.       Influenza A by PCR NEGATIVE NEGATIVE Final   Influenza B by PCR NEGATIVE NEGATIVE Final    Comment: (NOTE) The Xpert Xpress SARS-CoV-2/FLU/RSV plus assay is intended as an aid in the diagnosis of influenza from Nasopharyngeal swab specimens and should not be used as a sole basis for treatment. Nasal washings and aspirates are unacceptable for Xpert Xpress SARS-CoV-2/FLU/RSV testing.  Fact Sheet for Patients: EntrepreneurPulse.com.au  Fact Sheet for Healthcare Providers: IncredibleEmployment.be  This test is not yet approved or cleared by the [place] FDA and has been authorized for detection and/or diagnosis of SARS-CoV-2 by FDA under an Emergency Use Authorization (EUA). This EUA will remain in effect (meaning this test can be used) for the duration of the COVID-19 declaration under Section 564(b)(1) of the Act, 21 U.S.C. section 360bbb-3(b)(1), unless the authorization is terminated or revoked.  Performed at [facility], [address] [place], [place] [address]       Radiology Studies: No results found.   Marzetta Board, MD, PhD [facility]  Between 7 am - 7 pm I am available, please contact me via Amion (for emergencies) or Securechat (non urgent messages)  Between 7 pm - 7 am I am not available, please contact night coverage MD/APP via Amion

## 2021-01-14 NOTE — Progress Notes (Signed)
  Subjective: 2 Days Post-Op Procedure(s) (LRB): INTRAMEDULLARY (IM) NAIL INTERTROCHANTRIC (Left) Patient reports pain as mild this morning. Patient is confused but not combative this morning.  Answers my simple questions. Plan is to go Skilled nursing facility after hospital stay. Negative for chest pain and shortness of breath Fever: no Gastrointestinal:Negative for nausea and vomiting Reports that he is passing gas.  Objective: Vital signs in last 24 hours: Temp:  [97.8 F (36.6 C)-98.2 F (36.8 C)] 98.2 F (36.8 C) (11/29 0731) Pulse Rate:  [64-72] 69 (11/29 0731) Resp:  [16-17] 16 (11/29 0731) BP: (102-121)/(51-73) 121/73 (11/29 0731) SpO2:  [94 %-100 %] 100 % (11/29 0731)  Intake/Output from previous day:  Intake/Output Summary (Last 24 hours) at 01/14/2021 0752 Last data filed at 01/14/2021 0650 Gross per 24 hour  Intake --  Output 975 ml  Net -975 ml    Intake/Output this shift: No intake/output data recorded.  Labs: Recent Labs    01/12/21 0817 01/13/21 0851 01/14/21 0153  HGB 12.4* 8.5* 7.8*   Recent Labs    01/13/21 0851 01/14/21 0153  WBC 10.9* 8.1  RBC 2.49* 2.29*  HCT 24.9* 22.4*  PLT 108* 99*   Recent Labs    01/13/21 0851 01/14/21 0153  NA 130* 129*  K 4.1 3.7  CL 101 99  CO2 25 23  BUN 18 22  CREATININE 0.99 1.20  GLUCOSE 113* 104*  CALCIUM 7.4* 7.5*   No results for input(s): LABPT, INR in the last 72 hours.   EXAM General - Patient is Alert and Confused Extremity - Neurovascular intact Dorsiflexion/Plantar flexion intact Incision: moderate bloody drainage to the proximal hip incision No cellulitis present Compartment soft Dressing/Incision - blood tinged drainage Motor Function - intact, moving foot and toes well on exam.  Bowel sounds intact.  History reviewed. No pertinent past medical history.  Assessment/Plan: 2 Days Post-Op Procedure(s) (LRB): INTRAMEDULLARY (IM) NAIL INTERTROCHANTRIC (Left) Principal  Problem:   Femur fracture, left (HCC) Active Problems:   Cardiac pacemaker in situ   CAD (coronary artery disease)   S/P TAVR (transcatheter aortic valve replacement)   Atrial fibrillation, chronic (HCC)   Chronic diastolic CHF (congestive heart failure) (HCC)   COPD (chronic obstructive pulmonary disease) (HCC)   H/O sick sinus syndrome   BPH (benign prostatic hyperplasia)  Estimated body mass index is 23.91 kg/m as calculated from the following:   Height as of this encounter: 5\' 3"  (1.6 m).   Weight as of this encounter: 61.2 kg. Advance diet Up with therapy   Labs reviewed this AM, Hg 7.8 this AM.  Will continue on iron. Confusion has improved, no longer combative. Continue with therapy today. Begin working on BM.  DVT Prophylaxis - Lovenox and TED hose Weight-Bearing as tolerated to left leg  J. Cameron Proud, PA-C [facility] 01/14/2021, 7:52 AM

## 2021-01-14 NOTE — Progress Notes (Signed)
Physical Therapy Treatment Patient Details Name: Thomas Matthews MRN: [id number] DOB: [birth year] Today's Date: 01/14/2021   History of Present Illness presented ot ER secondary fall in STR, acute onset of L hip pain; admitted for management of L femur fracture, s/p ORIF (01/12/21), WBAT.    PT Comments    Patient much more alert and cognitively intact today; less restless and agitated, more cooperative with session.  Continues to require mod/max assist for all mobility attempts. Unable to tolerate static sitting in neutral position (leans/pushes to R) and unable to accept weight through L LE for R LE advancement (stepping) today.  OOB efforts deferred as result.     Recommendations for follow up therapy are one component of a multi-disciplinary discharge planning process, led by the attending physician.  Recommendations may be updated based on patient status, additional functional criteria and insurance authorization.  Follow Up Recommendations  Skilled nursing-short term rehab (<3 hours/day)     Assistance Recommended at Discharge Frequent or constant Supervision/Assistance  Equipment Recommendations  Rolling walker (2 wheels);BSC/3in1    Recommendations for Other Services       Precautions / Restrictions Precautions Precautions: Fall Restrictions Weight Bearing Restrictions: Yes LLE Weight Bearing: Weight bearing as tolerated     Mobility  Bed Mobility Overal bed mobility: Needs Assistance Bed Mobility: Supine to Sit;Sit to Supine     Supine to sit: Mod assist;Max assist Sit to supine: Max assist;Total assist        Transfers Overall transfer level: Needs assistance Equipment used: Rolling walker (2 wheels) Transfers: Sit to/from Stand Sit to Stand: Max assist;+2 physical assistance           General transfer comment: assist for lift off, standing balance; L LE buckling with loading attempts.  Unable to achieve full, upright posture this date; unable to  adequately weight LLE to initiate R LE advancement    Ambulation/Gait               General Gait Details: unsafe/unable due to limited tolerance for WBing L LE   Stairs             Wheelchair Mobility    Modified Rankin (Stroke Patients Only)       Balance Overall balance assessment: Needs assistance Sitting-balance support: No upper extremity supported;Feet supported Sitting balance-Leahy Scale: Poor Sitting balance - Comments: R lateral lean/push to unweight L hip in sitting   Standing balance support: Bilateral upper extremity supported Standing balance-Leahy Scale: Zero                              Cognition Arousal/Alertness: Awake/alert Behavior During Therapy: WFL for tasks assessed/performed Overall Cognitive Status: Within Functional Limits for tasks assessed                                 General Comments: Oriented to self, place and situation; follows simple commands        Exercises Other Exercises Other Exercises: Supine L LE therex, 1x12, act assist ROM for strength/flexibility of extremity: ankle pumps, quad SAQs, heel slides, hip abduct/adduct Other Exercises: Sup<>sit, sit<>stand x2    General Comments        Pertinent Vitals/Pain Pain Assessment: Faces Faces Pain Scale: Hurts even more Pain Location: L hip Pain Descriptors / Indicators: Aching;Grimacing;Guarding Pain Intervention(s): Limited activity within patient's tolerance;Monitored during session;Premedicated before session;Repositioned  Home Living Family/patient expects to be discharged to:: Skilled nursing facility                   Additional Comments: At baseline, lives home alone, active without assist device.  Since hospitalization approx 4 weeks prior, has been in STR.  Planned for transition to ALF, but fell day prior to planned discharge.    Prior Function            PT Goals (current goals can now be found in the care plan  section) Acute Rehab PT Goals Patient Stated Goal: to return to rehab and then transition to ALF PT Goal Formulation: With patient/family Time For Goal Achievement: 01/27/21 Potential to Achieve Goals: Fair Progress towards PT goals: Progressing toward goals    Frequency    7X/week      PT Plan Current plan remains appropriate    Co-evaluation              AM-PAC PT "6 Clicks" Mobility   Outcome Measure  Help needed turning from your back to your side while in a flat bed without using bedrails?: A Lot Help needed moving from lying on your back to sitting on the side of a flat bed without using bedrails?: Total Help needed moving to and from a bed to a chair (including a wheelchair)?: Total Help needed standing up from a chair using your arms (e.g., wheelchair or bedside chair)?: A Lot Help needed to walk in hospital room?: Total Help needed climbing 3-5 steps with a railing? : Total 6 Click Score: 8    End of Session Equipment Utilized During Treatment: Gait belt;Oxygen Activity Tolerance: Patient tolerated treatment well;Patient limited by pain Patient left: in bed;with call bell/phone within reach;with bed alarm set Nurse Communication: Mobility status PT Visit Diagnosis: Muscle weakness (generalized) (M62.81);Difficulty in walking, not elsewhere classified (R26.2);Pain Pain - Right/Left: Left Pain - part of body: Hip     Time: 1114-1140 PT Time Calculation (min) (ACUTE ONLY): 26 min  Charges:  $Therapeutic Exercise: 8-22 mins $Therapeutic Activity: 8-22 mins                     Kaseem Vastine H. Owens Shark, PT, DPT, NCS 01/14/21, 1:08 PM [phone]

## 2021-01-14 NOTE — Progress Notes (Signed)
Occupational Therapy Evaluation Patient Details Name: Thomas Matthews MRN: [id number] DOB: Apr 10, 1923 Today's Date: 01/14/2021   History of Present Illness presented ot ER secondary fall in STR, acute onset of L hip pain; admitted for management of L femur fracture, s/p ORIF (01/12/21), WBAT.   Clinical Impression   Thomas Matthews was seen for OT evaluation this date. Prior to hospital admission, per chart review, pt was modified independent and lives alone but was planning on transitioning to an ALF. Currently pt demonstrates impairments as described below (See OT problem list) which functionally limit his ability to perform ADL/self-care tasks. Pt currently requires MAX A + 2 for functional mobility. Pt stood 2X with 2 person HHA, however on second stand pt was unable to initiate or hold stand. Pt would benefit from skilled OT services to address noted impairments and functional limitations (see below for any additional details) in order to maximize safety and independence while minimizing falls risk and caregiver burden. Upon hospital discharge, recommend STR to maximize pt safety and return to PLOF.       Recommendations for follow up therapy are one component of a multi-disciplinary discharge planning process, led by the attending physician.  Recommendations may be updated based on patient status, additional functional criteria and insurance authorization.   Follow Up Recommendations  Skilled nursing-short term rehab (<3 hours/day)    Assistance Recommended at Discharge Frequent or constant Supervision/Assistance  Functional Status Assessment  Patient has had a recent decline in their functional status and demonstrates the ability to make significant improvements in function in a reasonable and predictable amount of time.  Equipment Recommendations  Other (comment) (defer to next venue of care)    Recommendations for Other Services       Precautions / Restrictions  Precautions Precautions: Fall Restrictions Weight Bearing Restrictions: Yes LLE Weight Bearing: Weight bearing as tolerated      Mobility Bed Mobility Overal bed mobility: Needs Assistance Bed Mobility: Supine to Sit;Sit to Supine     Supine to sit: Max assist;+2 for physical assistance Sit to supine: Max assist;+2 for physical assistance        Transfers Overall transfer level: Needs assistance Equipment used: 2 person hand held assist Transfers: Sit to/from Stand Sit to Stand: Max assist;+2 physical assistance                  Balance Overall balance assessment: Needs assistance Sitting-balance support: Bilateral upper extremity supported;Feet supported Sitting balance-Leahy Scale: Poor (Poor resolving to Fair)     Standing balance support: Bilateral upper extremity supported Standing balance-Leahy Scale: Poor                             ADL either performed or assessed with clinical judgement   ADL Overall ADL's : Needs assistance/impaired                                       General ADL Comments: MAX A + 2 for functional mobility.     Vision         Perception     Praxis      Pertinent Vitals/Pain Pain Assessment: Faces Faces Pain Scale: Hurts little more Pain Location: L hip Pain Descriptors / Indicators: Aching;Grimacing;Guarding Pain Intervention(s): Limited activity within patient's tolerance;Premedicated before session;Repositioned     Hand Dominance     Extremity/Trunk Assessment  Communication Communication Communication: HOH   Cognition Arousal/Alertness: Awake/alert   Overall Cognitive Status: Difficult to assess                                 General Comments: Oriented to self, place and situation     General Comments       Exercises Exercises: Other exercises Other Exercises Other Exercises: Pt educ re: OT role, safety precautions, importance of mvmt for  maintaining functional mobility Other Exercises: Sup<>sit, sit<>stand x2   Shoulder Instructions      Home Living Family/patient expects to be discharged to:: Skilled nursing facility                                 Additional Comments: At baseline, lives home alone, active without assist device.  Since hospitalization approx 4 weeks prior, has been in STR.  Planned for transition to ALF, but fell day prior to planned discharge.      Prior Functioning/Environment Prior Level of Function : Independent/Modified Independent             Mobility Comments: Mod indep at true baseline; has been using RW for mobility since recent hospitalization ADLs Comments: Family assists with meals (although pt able to boil eggs per dtr), set up meds. Pt still drives, is forgetful but does very well with set routines. Indep with bathing, dressing.        OT Problem List: Decreased strength;Decreased range of motion;Decreased activity tolerance;Impaired balance (sitting and/or standing);Decreased coordination;Decreased safety awareness;Decreased knowledge of use of DME or AE;Decreased knowledge of precautions      OT Treatment/Interventions: Self-care/ADL training;Therapeutic exercise;Energy conservation;DME and/or AE instruction;Therapeutic activities    OT Goals(Current goals can be found in the care plan section) Acute Rehab OT Goals Patient Stated Goal: to get better OT Goal Formulation: With patient Time For Goal Achievement: 01/28/21 Potential to Achieve Goals: Good  OT Frequency: Min 2X/week   Barriers to D/C:            Co-evaluation              AM-PAC OT "6 Clicks" Daily Activity     Outcome Measure Help from another person eating meals?: A Little Help from another person taking care of personal grooming?: A Little Help from another person toileting, which includes using toliet, bedpan, or urinal?: A Lot Help from another person bathing (including washing,  rinsing, drying)?: A Lot Help from another person to put on and taking off regular upper body clothing?: A Little Help from another person to put on and taking off regular lower body clothing?: A Lot 6 Click Score: 15   End of Session Equipment Utilized During Treatment: Oxygen  Activity Tolerance: Patient tolerated treatment well Patient left: in bed;with call bell/phone within reach;with bed alarm set  OT Visit Diagnosis: Unsteadiness on feet (R26.81);Muscle weakness (generalized) (M62.81);Other abnormalities of gait and mobility (R26.89)                Time: 1050-1107 OT Time Calculation (min): 17 min Charges:  OT General Charges $OT Visit: 1 Visit OT Evaluation $OT Eval Moderate Complexity: 1 Mod OT Treatments $Therapeutic Activity: 8-22 mins  Nino Glow, Markus Daft 01/14/2021, 11:48 AM

## 2021-01-14 NOTE — Progress Notes (Signed)
PT Cancellation Note  Patient Details Name: DOYAL SARIC MRN: [id number] DOB: Mar 26, 1923   Cancelled Treatment:    Reason Eval/Treat Not Completed: Pain limiting ability to participate (Treatment session attempted.  Patient unable to tolerate due to pain; meds requested per primary RN.  Will re-attempt once meds have been administered and pain better controlled.)  [place] Gipe H. Owens Shark, PT, DPT, NCS 01/14/21, 10:21 AM [phone]

## 2021-01-14 NOTE — Progress Notes (Signed)
Patient has been cathed x3 and current bladder scan at 389. Notified MD. Will place foley per protocol.

## 2021-01-14 NOTE — Progress Notes (Signed)
Pt had to be in and out cath overnight once per standing order. Pt was unable to urinate on his own. Pt was A&O X4 throughout the night without any agitation or confusion.

## 2021-01-15 DIAGNOSIS — Z950 Presence of cardiac pacemaker: Secondary | ICD-10-CM | POA: Diagnosis not present

## 2021-01-15 DIAGNOSIS — I5042 Chronic combined systolic (congestive) and diastolic (congestive) heart failure: Secondary | ICD-10-CM | POA: Diagnosis not present

## 2021-01-15 DIAGNOSIS — D62 Acute posthemorrhagic anemia: Secondary | ICD-10-CM | POA: Diagnosis not present

## 2021-01-15 DIAGNOSIS — I10 Essential (primary) hypertension: Secondary | ICD-10-CM | POA: Diagnosis not present

## 2021-01-15 DIAGNOSIS — I700 Atherosclerosis of aorta: Secondary | ICD-10-CM | POA: Diagnosis not present

## 2021-01-15 DIAGNOSIS — R29818 Other symptoms and signs involving the nervous system: Secondary | ICD-10-CM | POA: Diagnosis not present

## 2021-01-15 DIAGNOSIS — N400 Enlarged prostate without lower urinary tract symptoms: Secondary | ICD-10-CM | POA: Diagnosis not present

## 2021-01-15 DIAGNOSIS — Z888 Allergy status to other drugs, medicaments and biological substances status: Secondary | ICD-10-CM | POA: Diagnosis not present

## 2021-01-15 DIAGNOSIS — E43 Unspecified severe protein-calorie malnutrition: Secondary | ICD-10-CM | POA: Diagnosis not present

## 2021-01-15 DIAGNOSIS — G9340 Encephalopathy, unspecified: Secondary | ICD-10-CM | POA: Diagnosis not present

## 2021-01-15 DIAGNOSIS — I350 Nonrheumatic aortic (valve) stenosis: Secondary | ICD-10-CM | POA: Diagnosis not present

## 2021-01-15 DIAGNOSIS — E876 Hypokalemia: Secondary | ICD-10-CM | POA: Diagnosis present

## 2021-01-15 DIAGNOSIS — G459 Transient cerebral ischemic attack, unspecified: Secondary | ICD-10-CM | POA: Diagnosis not present

## 2021-01-15 DIAGNOSIS — R1313 Dysphagia, pharyngeal phase: Secondary | ICD-10-CM | POA: Diagnosis not present

## 2021-01-15 DIAGNOSIS — S79929A Unspecified injury of unspecified thigh, initial encounter: Secondary | ICD-10-CM | POA: Diagnosis not present

## 2021-01-15 DIAGNOSIS — D509 Iron deficiency anemia, unspecified: Secondary | ICD-10-CM | POA: Diagnosis not present

## 2021-01-15 DIAGNOSIS — W19XXXD Unspecified fall, subsequent encounter: Secondary | ICD-10-CM | POA: Diagnosis not present

## 2021-01-15 DIAGNOSIS — M8580 Other specified disorders of bone density and structure, unspecified site: Secondary | ICD-10-CM | POA: Diagnosis not present

## 2021-01-15 DIAGNOSIS — Z952 Presence of prosthetic heart valve: Secondary | ICD-10-CM | POA: Diagnosis not present

## 2021-01-15 DIAGNOSIS — G9341 Metabolic encephalopathy: Secondary | ICD-10-CM | POA: Diagnosis present

## 2021-01-15 DIAGNOSIS — J449 Chronic obstructive pulmonary disease, unspecified: Secondary | ICD-10-CM | POA: Diagnosis not present

## 2021-01-15 DIAGNOSIS — R404 Transient alteration of awareness: Secondary | ICD-10-CM | POA: Diagnosis not present

## 2021-01-15 DIAGNOSIS — I517 Cardiomegaly: Secondary | ICD-10-CM | POA: Diagnosis not present

## 2021-01-15 DIAGNOSIS — I959 Hypotension, unspecified: Secondary | ICD-10-CM | POA: Diagnosis not present

## 2021-01-15 DIAGNOSIS — F05 Delirium due to known physiological condition: Secondary | ICD-10-CM | POA: Diagnosis present

## 2021-01-15 DIAGNOSIS — I6529 Occlusion and stenosis of unspecified carotid artery: Secondary | ICD-10-CM | POA: Diagnosis not present

## 2021-01-15 DIAGNOSIS — Z20822 Contact with and (suspected) exposure to covid-19: Secondary | ICD-10-CM | POA: Diagnosis present

## 2021-01-15 DIAGNOSIS — R9431 Abnormal electrocardiogram [ECG] [EKG]: Secondary | ICD-10-CM | POA: Diagnosis not present

## 2021-01-15 DIAGNOSIS — E7800 Pure hypercholesterolemia, unspecified: Secondary | ICD-10-CM | POA: Diagnosis not present

## 2021-01-15 DIAGNOSIS — H3530 Unspecified macular degeneration: Secondary | ICD-10-CM | POA: Diagnosis not present

## 2021-01-15 DIAGNOSIS — R059 Cough, unspecified: Secondary | ICD-10-CM | POA: Diagnosis not present

## 2021-01-15 DIAGNOSIS — K219 Gastro-esophageal reflux disease without esophagitis: Secondary | ICD-10-CM | POA: Diagnosis not present

## 2021-01-15 DIAGNOSIS — J90 Pleural effusion, not elsewhere classified: Secondary | ICD-10-CM | POA: Diagnosis not present

## 2021-01-15 DIAGNOSIS — S7292XA Unspecified fracture of left femur, initial encounter for closed fracture: Secondary | ICD-10-CM | POA: Diagnosis not present

## 2021-01-15 DIAGNOSIS — Z7401 Bed confinement status: Secondary | ICD-10-CM | POA: Diagnosis not present

## 2021-01-15 DIAGNOSIS — R4020 Unspecified coma: Secondary | ICD-10-CM | POA: Diagnosis not present

## 2021-01-15 DIAGNOSIS — I672 Cerebral atherosclerosis: Secondary | ICD-10-CM | POA: Diagnosis not present

## 2021-01-15 DIAGNOSIS — I4729 Other ventricular tachycardia: Secondary | ICD-10-CM | POA: Diagnosis not present

## 2021-01-15 DIAGNOSIS — I495 Sick sinus syndrome: Secondary | ICD-10-CM | POA: Diagnosis not present

## 2021-01-15 DIAGNOSIS — I482 Chronic atrial fibrillation, unspecified: Secondary | ICD-10-CM | POA: Diagnosis present

## 2021-01-15 DIAGNOSIS — I2510 Atherosclerotic heart disease of native coronary artery without angina pectoris: Secondary | ICD-10-CM | POA: Diagnosis present

## 2021-01-15 DIAGNOSIS — G629 Polyneuropathy, unspecified: Secondary | ICD-10-CM | POA: Diagnosis not present

## 2021-01-15 DIAGNOSIS — I480 Paroxysmal atrial fibrillation: Secondary | ICD-10-CM | POA: Diagnosis not present

## 2021-01-15 DIAGNOSIS — Z9181 History of falling: Secondary | ICD-10-CM | POA: Diagnosis not present

## 2021-01-15 DIAGNOSIS — I110 Hypertensive heart disease with heart failure: Secondary | ICD-10-CM | POA: Diagnosis present

## 2021-01-15 DIAGNOSIS — R4182 Altered mental status, unspecified: Secondary | ICD-10-CM | POA: Diagnosis not present

## 2021-01-15 DIAGNOSIS — E860 Dehydration: Secondary | ICD-10-CM | POA: Diagnosis present

## 2021-01-15 DIAGNOSIS — Z79899 Other long term (current) drug therapy: Secondary | ICD-10-CM | POA: Diagnosis not present

## 2021-01-15 DIAGNOSIS — K449 Diaphragmatic hernia without obstruction or gangrene: Secondary | ICD-10-CM | POA: Diagnosis not present

## 2021-01-15 DIAGNOSIS — N3281 Overactive bladder: Secondary | ICD-10-CM | POA: Diagnosis not present

## 2021-01-15 DIAGNOSIS — S72142D Displaced intertrochanteric fracture of left femur, subsequent encounter for closed fracture with routine healing: Secondary | ICD-10-CM | POA: Diagnosis not present

## 2021-01-15 DIAGNOSIS — Z87891 Personal history of nicotine dependence: Secondary | ICD-10-CM | POA: Diagnosis not present

## 2021-01-15 DIAGNOSIS — Z66 Do not resuscitate: Secondary | ICD-10-CM | POA: Diagnosis present

## 2021-01-15 DIAGNOSIS — I5032 Chronic diastolic (congestive) heart failure: Secondary | ICD-10-CM | POA: Diagnosis present

## 2021-01-15 DIAGNOSIS — R531 Weakness: Secondary | ICD-10-CM | POA: Diagnosis not present

## 2021-01-15 LAB — CBC
HCT: 22.1 % — ABNORMAL LOW (ref 39.0–52.0)
Hemoglobin: 7.5 g/dL — ABNORMAL LOW (ref 13.0–17.0)
MCH: 33.3 pg (ref 26.0–34.0)
MCHC: 33.9 g/dL (ref 30.0–36.0)
MCV: 98.2 fL (ref 80.0–100.0)
Platelets: 98 10*3/uL — ABNORMAL LOW (ref 150–400)
RBC: 2.25 MIL/uL — ABNORMAL LOW (ref 4.22–5.81)
RDW: 16.9 % — ABNORMAL HIGH (ref 11.5–15.5)
WBC: 7.2 10*3/uL (ref 4.0–10.5)
nRBC: 0 % (ref 0.0–0.2)

## 2021-01-15 LAB — BASIC METABOLIC PANEL
Anion gap: 4 — ABNORMAL LOW (ref 5–15)
BUN: 21 mg/dL (ref 8–23)
CO2: 26 mmol/L (ref 22–32)
Calcium: 7.4 mg/dL — ABNORMAL LOW (ref 8.9–10.3)
Chloride: 99 mmol/L (ref 98–111)
Creatinine, Ser: 1.24 mg/dL (ref 0.61–1.24)
GFR, Estimated: 54 mL/min — ABNORMAL LOW (ref >60)
Glucose, Bld: 97 mg/dL (ref 70–99)
Potassium: 3.4 mmol/L — ABNORMAL LOW (ref 3.5–5.1)
Sodium: 129 mmol/L — ABNORMAL LOW (ref 135–145)

## 2021-01-15 MED ORDER — HYDROCODONE-ACETAMINOPHEN 5-325 MG PO TABS
1.0000 | ORAL_TABLET | Freq: Four times a day (QID) | ORAL | 0 refills | Status: DC | PRN
Start: 2021-01-15 — End: 2021-01-20

## 2021-01-15 MED ORDER — ENOXAPARIN SODIUM 30 MG/0.3ML IJ SOSY
30.0000 mg | PREFILLED_SYRINGE | INTRAMUSCULAR | Status: DC
  Administered 2021-01-15: 30 mg via SUBCUTANEOUS
  Filled 2021-01-15: qty 0.3

## 2021-01-15 MED ORDER — ENOXAPARIN SODIUM 30 MG/0.3ML IJ SOSY: 30.0000 mg | PREFILLED_SYRINGE | INTRAMUSCULAR | 0 refills | Status: DC

## 2021-01-15 NOTE — Discharge Summary (Signed)
Physician Discharge Summary   Patient name: Thomas Matthews  Admit date:     01/12/2021  Discharge date: 01/15/2021  Discharge Physician: Max Sane   PCP: Birdie Sons, MD   Recommendations at discharge: outpt f/up with providers as requested  Discharge Diagnoses Principal Problem:   Femur fracture, left [facility]) Active Problems:   Cardiac pacemaker in situ   CAD (coronary artery disease)   S/P TAVR (transcatheter aortic valve replacement)   Atrial fibrillation, chronic (HCC)   Chronic diastolic CHF (congestive heart failure) (HCC)   COPD (chronic obstructive pulmonary disease) ([place])   H/O sick sinus syndrome   BPH (benign prostatic hyperplasia)   Hospital Course    87 year old male with history of CAD, chronic diastolic CHF, COPD, chronic A. fib not on anticoagulation due to symptomatic anemia, severe AAS status post TAVR 2019, hypertension, SSS s/p PCM, pulmonary hypertension here with hip fracture after falling in the bathroom landing on his left side.  Orthopedic operated on 11/27.  Postop course complicated by delirium and confusion on 11/28 resolved on 11/29   Close displaced intertrochanteric fracture, left hip- status post reduction internal fixation of displacement left hip fracture with [employer] TFN nail. -SNF at D/C  Acute metabolic encephalopathy-likely delirium. Now resolved.   Acute blood loss anemia-postoperatively, hemoglobin 7.5 this morning, no need of transfusion.   Chronic atrial fibrillation-continue amiodarone, metoprolol, not on anticoagulation due to recent GI bleed requiring transfusions  History of CAD-continue metoprolol, statin  Chronic diastolic CHF-stop IV fluids, continue furosemide, metoprolol  History of severe AS status post TAVR-stable  Sick sinus syndrome-pacemaker in place   BPH-continue tamsulosin   History of COPD-stable, no wheezing   Procedures performed: status post reduction internal fixation of displacement left  hip fracture with Bi[employer] Aff[employer]is TFN nail on 11/27   Condition at discharge: fair  Exam Physical Exam   Constitutional: No distress Eyes: Anicteric ENMT: mmm  Neck: normal, supple Respiratory: Clear bilaterally, no wheezing, normal respiratory effort Cardiovascular: Regular rate and rhythm, no murmurs, no peripheral edema Abdomen: Soft, NT, ND, bowel sounds positive.  Musculoskeletal: blood tinged discharge to the proximal hip incision.  Skin: No rash appreciated Neurologic: Nonfocal, equal strength  Disposition: Skilled nursing facility/[facility] with Palliative care to follow  Discharge time: greater than 30 minutes.  Contact information for follow-up providers     Fisher, Kirstie Peri, MD. Schedule an appointment as soon as possible for a visit in 1 week(s).   Specialty: Family Medicine Why: [facility] Discharge F/UP Contact information: [address] [place] [address] [phone]         Lattie Corns, [place]. Schedule an appointment as soon as possible for a visit in 2 week(s).   Specialty: Physician Assistant Why: [facility] Discharge F/UP Contact information: [place] [address] [phone]              Contact information for after-discharge care     [facility] .   Service: Skilled Nursing Contact information: [place]                     Allergies as of 01/15/2021       Reactions   Amlodipine Besylate Swelling   Felodipine Swelling   Oxybutynin Chloride Other (See Comments)   Other reaction(s): Dizziness Tolerates  taking at night   Simvastatin    Other reaction(s): Dizziness   Terazosin    Other reaction(s): Low blood pressure        Medication List     STOP taking these medications     oxybutynin 10 MG 24 hr tablet Commonly known as: DITROPAN-XL       TAKE these medications    amiodarone 200 MG tablet Commonly known as: PACERONE Take 1 tablet (200 mg total) by mouth 2 (two) times daily.   calcium citrate-vitamin D 315-200 MG-UNIT tablet Commonly known as: CITRACAL+D Take 2 tablets by mouth daily.   enoxaparin 30 MG/0.3ML injection Commonly known as: LOVENOX Inject 0.3 mLs (30 mg total) into the skin daily.   ferrous sulfate 325 (65 FE) MG tablet Take 1 tablet (325 mg total) by mouth 2 (two) times daily with a meal.   furosemide 40 MG tablet Commonly known as: LASIX Take 1 tablet (40 mg total) by mouth daily.   HYDROcodone-acetaminophen 5-325 MG tablet Commonly known as: NORCO/VICODIN Take 1 tablet by mouth every 6 (six) hours as needed for moderate pain.   metoprolol tartrate 25 MG tablet Commonly known as: LOPRESSOR Take 2 tablets (50 mg total) by mouth 2 (two) times daily.   pantoprazole 40 MG tablet Commonly known as: Protonix Take 1 tablet (40 mg total) by mouth 2 (two) times daily for 28 days, THEN 1 tablet (40 mg total) 2 (two) times daily. Start taking on: December 17, 2020   pravastatin 20 MG tablet Commonly known as: PRAVACHOL Take 10 mg by mouth daily.   tamsulosin 0.4 MG Caps capsule Commonly known as: FLOMAX Take 0.4 mg by mouth daily after breakfast.   tolterodine 4 MG 24 hr capsule Commonly known as: DETROL LA Take 4 mg by mouth daily.        DG Pelvis 1-2 Views  Result Date: 01/12/2021 CLINICAL DATA:  Fall with hip pain EXAM: PELVIS - 1-2 VIEW COMPARISON:  None. FINDINGS: Acute intertrochanteric left femur fracture with mild varus angulation. No evidence of pelvic ring fracture or diastasis. Generalized osteopenia and atheromatous calcification. Gas over the right groin suggesting hernia. IMPRESSION: 1. Acute intertrochanteric left femur fracture. 2. Generalized osteopenia. 3. Gas over the right groin suggesting bowel  containing hernia. Electronically Signed   By: Jorje Guild M.D.   On: 01/12/2021 09:00   CT Head Wo Contrast  Result Date: 01/12/2021 CLINICAL DATA:  Trauma, fall EXAM: CT HEAD WITHOUT CONTRAST TECHNIQUE: Contiguous axial images were obtained from the base of the skull through the vertex without intravenous contrast. COMPARISON:  12/11/2020 FINDINGS: Brain: No acute intracranial findings are seen. Cortical sulci are prominent. There is decreased density in subcortical and periventricular white matter. Vascular: Scattered arterial calcifications are seen. Skull: Unremarkable. Sinuses/Orbits: There is mucosal thickening in the ethmoid and left maxillary sinuses. Other: No significant interval changes are noted. IMPRESSION: No acute intracranial findings are seen. Atrophy. Small-vessel disease. Mild chronic sinusitis. Electronically Signed   By: Elmer Picker M.D.   On: 01/12/2021 09:51   CT Cervical Spine Wo Contrast  Result Date: 01/12/2021 CLINICAL DATA:  Trauma, fall EXAM: CT CERVICAL SPINE WITHOUT CONTRAST TECHNIQUE: Multidetector CT imaging of the cervical spine was performed without intravenous contrast. Multiplanar CT image reconstructions were also generated. COMPARISON:  12/11/2020 FINDINGS: Alignment: Alignment of posterior margins of vertebral bodies is essentially unremarkable. There is minimal anterolisthesis at C6-C7 level which has not changed significantly. Skull base and vertebrae: No recent fracture is seen. There  are few small smooth marginated calcifications in the soft tissues posterior to the spinous processes of lower cervical and upper thoracic spine, possibly ligament calcification from previous injury. Degenerative changes are noted with disc space narrowing, bony spurs and facet hypertrophy at multiple levels. Soft tissues and spinal canal: There is extrinsic pressure over the ventral margin of thecal sac caused by posterior bony spurs at multiple levels, more so at C6-C7  level with mild-to-moderate spinal stenosis. Disc levels: There is encroachment of neural foramina by uncovertebral spurs from C3-C7 levels. Upper chest: Blebs and bullae are seen in the upper lung fields, more so on the right side. Other: No significant interval changes are noted. IMPRESSION: No recent fracture is seen in the cervical spine. Cervical spondylosis with spinal stenosis and encroachment of neural foramina at multiple levels. Overall, no significant interval changes are noted since 12/11/2020. COPD. Electronically Signed   By: Elmer Picker M.D.   On: 01/12/2021 09:59   DG Chest Port 1 View  Result Date: 01/12/2021 CLINICAL DATA:  Hip pain EXAM: PORTABLE CHEST 1 VIEW COMPARISON:  12/11/2020 FINDINGS: Chronic cardiomegaly with dual-chamber pacer. Transcatheter aortic valve replacement. Trace left pleural effusion. Streaky density bilaterally which is vague and favors atelectasis or scarring. IMPRESSION: 1. Chronic cardiomegaly and trace left pleural effusion. 2. Suspect mild atelectasis or scarring. Electronically Signed   By: Jorje Guild M.D.   On: 01/12/2021 08:59   DG C-Arm 1-60 Min-No Report  Result Date: 01/12/2021 Fluoroscopy was utilized by the requesting physician.  No radiographic interpretation.   DG HIP UNILAT WITH PELVIS 2-3 VIEWS LEFT  Result Date: 01/12/2021 CLINICAL DATA:  Placement of left intramedullary nail. EXAM: DG HIP (WITH OR WITHOUT PELVIS) 2-3V LEFT COMPARISON:  Earlier same day left hip radiograph at 0823 hours FINDINGS: Fluoroscopic images were obtained intraoperatively and submitted for post operative interpretation. 6 images were obtained with 60 seconds of fluoroscopy time. Provided images demonstrate placement of left intramedullary rod with proximal lag screw and distal fixation screw. Hardware is in appropriate position. Please see the performing provider's procedural report for further detail. IMPRESSION: Intraoperative imaging for placement of  left intramedullary nail. Please see performing provider's procedural report for further details. Electronically Signed   By: Ileana Roup M.D.   On: 01/12/2021 16:31   DG FEMUR MIN 2 VIEWS LEFT  Result Date: 01/12/2021 CLINICAL DATA:  Fall in bathroom with hip pain EXAM: LEFT FEMUR 2 VIEWS COMPARISON:  None. FINDINGS: Acute intertrochanteric left femur fracture with mild varus angulation. Generalized osteopenia. The mid to distal femur is intact. Atheromatous calcification. IMPRESSION: Acute intertrochanteric left femur fracture. Electronically Signed   By: Jorje Guild M.D.   On: 01/12/2021 08:56   Results for orders placed or performed during the hospital encounter of 01/12/21  Resp Panel by RT-PCR (Flu A&B, Covid) Nasopharyngeal Swab     Status: None   Collection Time: 01/12/21  9:10 AM   Specimen: Nasopharyngeal Swab; Nasopharyngeal(NP) swabs in vial transport medium  Result Value Ref Range Status   SARS Coronavirus 2 by RT PCR NEGATIVE NEGATIVE Final    Comment: (NOTE) SARS-CoV-2 target nucleic acids are NOT DETECTED.  The SARS-CoV-2 RNA is generally detectable in upper respiratory specimens during the acute phase of infection. The lowest concentration of SARS-CoV-2 viral copies this assay can detect is 138 copies/mL. A negative result does not preclude SARS-Cov-2 infection and should not be used as the sole basis for treatment or other patient management decisions. A negative result may occur  with  improper specimen collection/handling, submission of specimen other than nasopharyngeal swab, presence of viral mutation(s) within the areas targeted by this assay, and inadequate number of viral copies(<138 copies/mL). A negative result must be combined with clinical observations, patient history, and epidemiological information. The expected result is Negative.  Fact Sheet for Patients:  EntrepreneurPulse.com.au  Fact Sheet for Healthcare Providers:   IncredibleEmployment.be  This test is no t yet approved or cleared by the [place] FDA and  has been authorized for detection and/or diagnosis of SARS-CoV-2 by FDA under an Emergency Use Authorization (EUA). This EUA will remain  in effect (meaning this test can be used) for the duration of the COVID-19 declaration under Section 564(b)(1) of the Act, 21 U.S.C.section 360bbb-3(b)(1), unless the authorization is terminated  or revoked sooner.       Influenza A by PCR NEGATIVE NEGATIVE Final   Influenza B by PCR NEGATIVE NEGATIVE Final    Comment: (NOTE) The Xpert Xpress SARS-CoV-2/FLU/RSV plus assay is intended as an aid in the diagnosis of influenza from Nasopharyngeal swab specimens and should not be used as a sole basis for treatment. Nasal washings and aspirates are unacceptable for Xpert Xpress SARS-CoV-2/FLU/RSV testing.  Fact Sheet for Patients: EntrepreneurPulse.com.au  Fact Sheet for Healthcare Providers: IncredibleEmployment.be  This test is not yet approved or cleared by the [place] FDA and has been authorized for detection and/or diagnosis of SARS-CoV-2 by FDA under an Emergency Use Authorization (EUA). This EUA will remain in effect (meaning this test can be used) for the duration of the COVID-19 declaration under Section 564(b)(1) of the Act, 21 U.S.C. section 360bbb-3(b)(1), unless the authorization is terminated or revoked.  Performed at [facility], [address] [place], [place] [address]     Signed:  Max Sane MD.  [facility] 01/15/2021, 9:57 AM

## 2021-01-15 NOTE — Progress Notes (Signed)
PHARMACIST - PHYSICIAN COMMUNICATION  CONCERNING:  Enoxaparin (Lovenox) for DVT Prophylaxis    RECOMMENDATION: Patient was prescribed enoxaprin 40mg  q24 hours for VTE prophylaxis.   Filed Weights   01/12/21 0908  Weight: 61.2 kg (135 lb)    Body mass index is 23.91 kg/m.  Estimated Creatinine Clearance: 28.7 mL/min (by C-G formula based on SCr of 1.24 mg/dL).  Patient is candidate for enoxaparin 30mg  every 24 hours based on CrCl <77ml/min or Weight <45kg  DESCRIPTION: Pharmacy has adjusted enoxaparin dose per [facility] policy.  Patient is now receiving enoxaparin 30 mg every 24 hours   Renda Rolls, PharmD, [facility] 01/15/2021 6:11 AM

## 2021-01-15 NOTE — Progress Notes (Addendum)
  Subjective: 3 Days Post-Op Procedure(s) (LRB): INTRAMEDULLARY (IM) NAIL INTERTROCHANTRIC (Left) Patient reports pain as mild this morning. Patient is confused and [place] unable to give significant history. Plan is to go Skilled nursing facility after hospital stay. Negative for chest pain and shortness of breath Fever: no Gastrointestinal:Negative for nausea and vomiting Reports that he is passing gas and he has had a BM.  Objective: Vital signs in last 24 hours: Temp:  [97.6 F (36.4 C)-98 F (36.7 C)] 97.9 F (36.6 C) (11/30 0724) Pulse Rate:  [65-75] 75 (11/30 0724) Resp:  [15-17] 15 (11/30 0724) BP: (94-133)/(57-73) 133/73 (11/30 0724) SpO2:  [93 %-100 %] 97 % (11/30 0724)  Intake/Output from previous day:  Intake/Output Summary (Last 24 hours) at 01/15/2021 1204 Last data filed at 01/15/2021 1024 Gross per 24 hour  Intake 240 ml  Output 2150 ml  Net -1910 ml    Intake/Output this shift: Total I/O In: 240 [P.O.:240] Out: -   Labs: Recent Labs    01/13/21 0851 01/14/21 0153 01/15/21 0236  HGB 8.5* 7.8* 7.5*   Recent Labs    01/14/21 0153 01/15/21 0236  WBC 8.1 7.2  RBC 2.29* 2.25*  HCT 22.4* 22.1*  PLT 99* 98*   Recent Labs    01/14/21 0153 01/15/21 0236  NA 129* 129*  K 3.7 3.4*  CL 99 99  CO2 23 26  BUN 22 21  CREATININE 1.20 1.24  GLUCOSE 104* 97  CALCIUM 7.5* 7.4*   No results for input(s): LABPT, INR in the last 72 hours.   EXAM General - Patient is Alert and Confused Extremity - Neurovascular intact Dorsiflexion/Plantar flexion intact Incision: moderate bloody drainage to the proximal hip incision No cellulitis present Compartment soft New honeycomb dressings were applied today. Dressing/Incision - blood tinged drainage Motor Function - intact, moving foot and toes well on exam.  Bowel sounds intact.  History reviewed. No pertinent past medical history.  Assessment/Plan: 3 Days Post-Op Procedure(s) (LRB): INTRAMEDULLARY (IM)  NAIL INTERTROCHANTRIC (Left) Principal Problem:   Femur fracture, left (HCC) Active Problems:   Cardiac pacemaker in situ   CAD (coronary artery disease)   S/P TAVR (transcatheter aortic valve replacement)   Atrial fibrillation, chronic (HCC)   Chronic diastolic CHF (congestive heart failure) (HCC)   COPD (chronic obstructive pulmonary disease) (HCC)   H/O sick sinus syndrome   BPH (benign prostatic hyperplasia)  Estimated body mass index is 23.91 kg/m as calculated from the following:   Height as of this encounter: 5\' 3"  (1.6 m).   Weight as of this encounter: 61.2 kg. Advance diet Up with therapy   Labs reviewed this AM, Hg 7.5 this AM.  Will continue on iron. Confusion has improved, no longer combative. Continue with therapy today. Patient has had a BM.  Staples can be removed by SNF on 01/26/21.  Follow-up with [facility] orthopaedics in 6 weeks for x-rays of the left leg. Continue Lovenox 30mg  daily for 14 days following surgery.  DVT Prophylaxis - Lovenox and TED hose Weight-Bearing as tolerated to left leg  J. Cameron Proud, PA-C [facility] Orthopaedic Surgery 01/15/2021, 12:04 PM

## 2021-01-15 NOTE — Care Management Important Message (Signed)
Important Message  Patient Details  Name: Thomas Matthews MRN: [id number] Date of Birth: [birth year]   Medicare Important Message Given:  N/A - LOS <3 / Initial given by admissions     Juliann Pulse A Lema Heinkel 01/15/2021, 10:42 AM

## 2021-01-15 NOTE — Progress Notes (Signed)
Physical Therapy Treatment Patient Details Name: Thomas Matthews MRN: [id number] DOB: 1923-10-26 Today's Date: 01/15/2021   History of Present Illness presented ot ER secondary fall in STR, acute onset of L hip pain; admitted for management of L femur fracture, s/p ORIF (01/12/21), WBAT.    PT Comments    Pt was long sitting in bed upon arriving. He is alert but HOH. Oriented x 3. Agrees to session with encouragement. Required mod-max assist of one to exit R side of bed with increased time and vcs throughout. Was able to sit EOB x ~ 5 minutes while performing several exercises. Improved balance in sitting from previous date. He stood 2 x EOB with bed height only minimally elevated. Was able to advance to ambulation with RW however continues to have severe knee buckling on LLE when advancing RLE. Author had to stabilize LLE when pt took steps with RLE.Pt fatigued quickly but overall is progressing. Author recommends Conservation officer, historic buildings not attempt to ambulate at this time. Recommend +2 assistance to stand pivot to/from recliner> bed. Pt will greatly benefit from SNF at [place] to address deficits while maximizing independence with ADLs.    Recommendations for follow up therapy are one component of a multi-disciplinary discharge planning process, led by the attending physician.  Recommendations may be updated based on patient status, additional functional criteria and insurance authorization.  Follow Up Recommendations  Skilled nursing-short term rehab (<3 hours/day)     Assistance Recommended at Discharge Frequent or constant Supervision/Assistance  Equipment Recommendations  Rolling walker (2 wheels);BSC/3in1       Precautions / Restrictions Precautions Precautions: Fall Restrictions Weight Bearing Restrictions: Yes LLE Weight Bearing: Weight bearing as tolerated     Mobility  Bed Mobility Overal bed mobility: Needs Assistance Bed Mobility: Supine to Sit     Supine to sit: Mod assist;Max  assist     General bed mobility comments: increased time + alot of vcs to progress from long sit to short sit EOB    Transfers Overall transfer level: Needs assistance Equipment used: Rolling walker (2 wheels) Transfers: Sit to/from Stand Sit to Stand: Min assist;From elevated surface           General transfer comment: Min assist to stand however pt continues to struggle with advancement of non operative LE due to buckling on operative LE.    Ambulation/Gait Ambulation/Gait assistance: Max assist Gait Distance (Feet): 8 Feet Assistive device: Rolling walker (2 wheels) Gait Pattern/deviations: Step-to pattern (knee buckling) Gait velocity: decreased     General Gait Details: Pt was able to ambulate short distance however severely limiited bu knee buckling with advancement of non op LE. Author stabilized operative LE while pt advance non op leg.  Recommended RN staff not attempt to ambulate. stand pivot most appropriate with returning to bed. RN tech/RN to contact author for assistance with returning pt to bed once ready     Balance Overall balance assessment: Needs assistance Sitting-balance support: No upper extremity supported;Feet supported Sitting balance-Leahy Scale: Fair Sitting balance - Comments: pt was able to sit EOB x ~ 5 minute with close SBA + occasional CGA   Standing balance support: Reliant on assistive device for balance;During functional activity;Bilateral upper extremity supported Standing balance-Leahy Scale: Poor Standing balance comment: static standing is good. Poor dynamic balance      Cognition Arousal/Alertness: Awake/alert Behavior During Therapy: WFL for tasks assessed/performed Overall Cognitive Status: Within Functional Limits for tasks assessed      General Comments: Pt is extremely HOH.  Oriented to self, place and situation; follows simple commands. Needs increased time to process           General Comments General comments (skin  integrity, edema, etc.): pt performed several ther ex in recliner tyo promote strengthening. Breakfast tray arrived during.      Pertinent Vitals/Pain Pain Assessment: PAINAD Breathing: normal Negative Vocalization: occasional moan/groan, low speech, negative/disapproving quality Facial Expression: smiling or inexpressive Body Language: relaxed Consolability: no need to console PAINAD Score: 1 Pain Location: L hip Pain Descriptors / Indicators: Aching;Grimacing;Guarding Pain Intervention(s): Limited activity within patient's tolerance;Monitored during session;Repositioned     PT Goals (current goals can now be found in the care plan section) Acute Rehab PT Goals Patient Stated Goal: none stated Progress towards PT goals: Progressing toward goals    Frequency    7X/week      PT Plan Current plan remains appropriate       AM-PAC PT "6 Clicks" Mobility   Outcome Measure  Help needed turning from your back to your side while in a flat bed without using bedrails?: A Lot Help needed moving from lying on your back to sitting on the side of a flat bed without using bedrails?: A Lot Help needed moving to and from a bed to a chair (including a wheelchair)?: A Lot Help needed standing up from a chair using your arms (e.g., wheelchair or bedside chair)?: A Lot Help needed to walk in hospital room?: A Lot Help needed climbing 3-5 steps with a railing? : Total 6 Click Score: 11    End of Session Equipment Utilized During Treatment: Gait belt Activity Tolerance: Patient tolerated treatment well Patient left: in chair;with call bell/phone within reach;with chair alarm set;with nursing/sitter in room Nurse Communication: Mobility status PT Visit Diagnosis: Muscle weakness (generalized) (M62.81);Difficulty in walking, not elsewhere classified (R26.2);Pain Pain - Right/Left: Left Pain - part of body: Hip     Time: 0165-5374 PT Time Calculation (min) (ACUTE ONLY): 24  min  Charges:  $Gait Training: 8-22 mins $Therapeutic Exercise: 8-22 mins                     Julaine Fusi PTA 01/15/21, 9:41 AM

## 2021-01-15 NOTE — TOC Progression Note (Addendum)
Transition of Care [facility]) - Progression Note    Patient Details  Name: Thomas Matthews MRN: [id number] Date of Birth: 10/16/1923  Transition [facility] CM/SW [place], RN Phone Number: 01/15/2021, 10:07 AM  Clinical Narrative:      Patient to go to [facility] room 508, Daughter Neoma Laming aware, will transport Via EMS    EMS called 2nd on list  Expected Discharge Plan and Services           Expected Discharge Date: 01/15/21                                     Social Determinants of Health (SDOH) Interventions    Readmission Risk Interventions No flowsheet data found.

## 2021-01-15 NOTE — Discharge Instructions (Signed)

## 2021-01-15 NOTE — Progress Notes (Signed)
Called and gave report to Psychiatrist at [facility].

## 2021-01-15 NOTE — Progress Notes (Signed)
Pt had 1 smear and 1 small liquid brown BM overnight. Pt insisted to get on the bedside commode to have a BM. Multiple attempt made with +2 assist but patient able to stand with assist but unable to progress to pvt. Situation placed patient at higher risk for fall due to patient leaning backward. Pt was placed back in the bed without issue and encouraged to use the bedpan instead but patient stated "I cannot do it in the bedpan, I need to get on the commode"

## 2021-01-16 DIAGNOSIS — I482 Chronic atrial fibrillation, unspecified: Secondary | ICD-10-CM | POA: Diagnosis not present

## 2021-01-16 DIAGNOSIS — Z950 Presence of cardiac pacemaker: Secondary | ICD-10-CM | POA: Diagnosis not present

## 2021-01-16 DIAGNOSIS — I495 Sick sinus syndrome: Secondary | ICD-10-CM | POA: Diagnosis not present

## 2021-01-16 DIAGNOSIS — I5042 Chronic combined systolic (congestive) and diastolic (congestive) heart failure: Secondary | ICD-10-CM | POA: Diagnosis not present

## 2021-01-16 DIAGNOSIS — S72142D Displaced intertrochanteric fracture of left femur, subsequent encounter for closed fracture with routine healing: Secondary | ICD-10-CM | POA: Diagnosis not present

## 2021-01-16 DIAGNOSIS — G9340 Encephalopathy, unspecified: Secondary | ICD-10-CM | POA: Diagnosis not present

## 2021-01-17 ENCOUNTER — Emergency Department: Payer: Medicare Other

## 2021-01-17 ENCOUNTER — Inpatient Hospital Stay: Payer: Medicare Other

## 2021-01-17 ENCOUNTER — Inpatient Hospital Stay
Admission: EM | Admit: 2021-01-17 | Discharge: 2021-01-20 | DRG: 071 | Disposition: A | Discharge status: Skilled Nursing Facility | Payer: Medicare Other | Source: Skilled Nursing Facility | Attending: Internal Medicine | Admitting: Internal Medicine

## 2021-01-17 ENCOUNTER — Other Ambulatory Visit: Payer: Self-pay

## 2021-01-17 DIAGNOSIS — I5032 Chronic diastolic (congestive) heart failure: Secondary | ICD-10-CM | POA: Diagnosis present

## 2021-01-17 DIAGNOSIS — J449 Chronic obstructive pulmonary disease, unspecified: Secondary | ICD-10-CM | POA: Diagnosis present

## 2021-01-17 DIAGNOSIS — R059 Cough, unspecified: Secondary | ICD-10-CM | POA: Diagnosis not present

## 2021-01-17 DIAGNOSIS — G9341 Metabolic encephalopathy: Principal | ICD-10-CM | POA: Diagnosis present

## 2021-01-17 DIAGNOSIS — J90 Pleural effusion, not elsewhere classified: Secondary | ICD-10-CM | POA: Diagnosis not present

## 2021-01-17 DIAGNOSIS — I4729 Other ventricular tachycardia: Secondary | ICD-10-CM | POA: Diagnosis not present

## 2021-01-17 DIAGNOSIS — Z952 Presence of prosthetic heart valve: Secondary | ICD-10-CM | POA: Diagnosis not present

## 2021-01-17 DIAGNOSIS — I482 Chronic atrial fibrillation, unspecified: Secondary | ICD-10-CM | POA: Diagnosis present

## 2021-01-17 DIAGNOSIS — G459 Transient cerebral ischemic attack, unspecified: Secondary | ICD-10-CM

## 2021-01-17 DIAGNOSIS — I2510 Atherosclerotic heart disease of native coronary artery without angina pectoris: Secondary | ICD-10-CM | POA: Diagnosis present

## 2021-01-17 DIAGNOSIS — R1313 Dysphagia, pharyngeal phase: Secondary | ICD-10-CM | POA: Diagnosis not present

## 2021-01-17 DIAGNOSIS — Z7401 Bed confinement status: Secondary | ICD-10-CM | POA: Diagnosis not present

## 2021-01-17 DIAGNOSIS — I672 Cerebral atherosclerosis: Secondary | ICD-10-CM | POA: Diagnosis not present

## 2021-01-17 DIAGNOSIS — Z66 Do not resuscitate: Secondary | ICD-10-CM | POA: Diagnosis present

## 2021-01-17 DIAGNOSIS — G629 Polyneuropathy, unspecified: Secondary | ICD-10-CM | POA: Diagnosis not present

## 2021-01-17 DIAGNOSIS — Z20822 Contact with and (suspected) exposure to covid-19: Secondary | ICD-10-CM | POA: Diagnosis present

## 2021-01-17 DIAGNOSIS — R4020 Unspecified coma: Secondary | ICD-10-CM | POA: Diagnosis not present

## 2021-01-17 DIAGNOSIS — Z8673 Personal history of transient ischemic attack (TIA), and cerebral infarction without residual deficits: Secondary | ICD-10-CM | POA: Diagnosis not present

## 2021-01-17 DIAGNOSIS — R9431 Abnormal electrocardiogram [ECG] [EKG]: Secondary | ICD-10-CM | POA: Diagnosis not present

## 2021-01-17 DIAGNOSIS — I350 Nonrheumatic aortic (valve) stenosis: Secondary | ICD-10-CM | POA: Diagnosis not present

## 2021-01-17 DIAGNOSIS — I6523 Occlusion and stenosis of bilateral carotid arteries: Secondary | ICD-10-CM | POA: Diagnosis not present

## 2021-01-17 DIAGNOSIS — E860 Dehydration: Secondary | ICD-10-CM | POA: Diagnosis present

## 2021-01-17 DIAGNOSIS — D62 Acute posthemorrhagic anemia: Secondary | ICD-10-CM | POA: Diagnosis not present

## 2021-01-17 DIAGNOSIS — I959 Hypotension, unspecified: Secondary | ICD-10-CM | POA: Diagnosis present

## 2021-01-17 DIAGNOSIS — H3530 Unspecified macular degeneration: Secondary | ICD-10-CM | POA: Diagnosis not present

## 2021-01-17 DIAGNOSIS — R29818 Other symptoms and signs involving the nervous system: Secondary | ICD-10-CM | POA: Diagnosis not present

## 2021-01-17 DIAGNOSIS — N3281 Overactive bladder: Secondary | ICD-10-CM | POA: Diagnosis not present

## 2021-01-17 DIAGNOSIS — I5042 Chronic combined systolic (congestive) and diastolic (congestive) heart failure: Secondary | ICD-10-CM | POA: Diagnosis not present

## 2021-01-17 DIAGNOSIS — E43 Unspecified severe protein-calorie malnutrition: Secondary | ICD-10-CM | POA: Diagnosis not present

## 2021-01-17 DIAGNOSIS — W19XXXD Unspecified fall, subsequent encounter: Secondary | ICD-10-CM | POA: Diagnosis not present

## 2021-01-17 DIAGNOSIS — Z8719 Personal history of other diseases of the digestive system: Secondary | ICD-10-CM

## 2021-01-17 DIAGNOSIS — I700 Atherosclerosis of aorta: Secondary | ICD-10-CM | POA: Diagnosis not present

## 2021-01-17 DIAGNOSIS — K449 Diaphragmatic hernia without obstruction or gangrene: Secondary | ICD-10-CM | POA: Diagnosis not present

## 2021-01-17 DIAGNOSIS — Z950 Presence of cardiac pacemaker: Secondary | ICD-10-CM

## 2021-01-17 DIAGNOSIS — I6529 Occlusion and stenosis of unspecified carotid artery: Secondary | ICD-10-CM | POA: Diagnosis not present

## 2021-01-17 DIAGNOSIS — Z79899 Other long term (current) drug therapy: Secondary | ICD-10-CM

## 2021-01-17 DIAGNOSIS — I517 Cardiomegaly: Secondary | ICD-10-CM | POA: Diagnosis not present

## 2021-01-17 DIAGNOSIS — I480 Paroxysmal atrial fibrillation: Secondary | ICD-10-CM | POA: Diagnosis not present

## 2021-01-17 DIAGNOSIS — Z87891 Personal history of nicotine dependence: Secondary | ICD-10-CM | POA: Diagnosis not present

## 2021-01-17 DIAGNOSIS — I110 Hypertensive heart disease with heart failure: Secondary | ICD-10-CM | POA: Diagnosis present

## 2021-01-17 DIAGNOSIS — K219 Gastro-esophageal reflux disease without esophagitis: Secondary | ICD-10-CM | POA: Diagnosis not present

## 2021-01-17 DIAGNOSIS — S72142D Displaced intertrochanteric fracture of left femur, subsequent encounter for closed fracture with routine healing: Secondary | ICD-10-CM | POA: Diagnosis not present

## 2021-01-17 DIAGNOSIS — M8580 Other specified disorders of bone density and structure, unspecified site: Secondary | ICD-10-CM | POA: Diagnosis not present

## 2021-01-17 DIAGNOSIS — E876 Hypokalemia: Secondary | ICD-10-CM | POA: Diagnosis present

## 2021-01-17 DIAGNOSIS — I495 Sick sinus syndrome: Secondary | ICD-10-CM | POA: Diagnosis not present

## 2021-01-17 DIAGNOSIS — R531 Weakness: Secondary | ICD-10-CM | POA: Diagnosis not present

## 2021-01-17 DIAGNOSIS — R4182 Altered mental status, unspecified: Secondary | ICD-10-CM | POA: Diagnosis not present

## 2021-01-17 DIAGNOSIS — Z9181 History of falling: Secondary | ICD-10-CM | POA: Diagnosis not present

## 2021-01-17 DIAGNOSIS — F05 Delirium due to known physiological condition: Secondary | ICD-10-CM | POA: Diagnosis present

## 2021-01-17 DIAGNOSIS — Z888 Allergy status to other drugs, medicaments and biological substances status: Secondary | ICD-10-CM

## 2021-01-17 DIAGNOSIS — D509 Iron deficiency anemia, unspecified: Secondary | ICD-10-CM | POA: Diagnosis not present

## 2021-01-17 DIAGNOSIS — S79929A Unspecified injury of unspecified thigh, initial encounter: Secondary | ICD-10-CM | POA: Diagnosis not present

## 2021-01-17 DIAGNOSIS — I10 Essential (primary) hypertension: Secondary | ICD-10-CM | POA: Diagnosis not present

## 2021-01-17 LAB — URINALYSIS, ROUTINE W REFLEX MICROSCOPIC
Bilirubin Urine: NEGATIVE
Glucose, UA: NEGATIVE mg/dL
Hgb urine dipstick: NEGATIVE
Ketones, ur: NEGATIVE mg/dL
Leukocytes,Ua: NEGATIVE
Nitrite: NEGATIVE
Protein, ur: NEGATIVE mg/dL
Specific Gravity, Urine: 1.009 (ref 1.005–1.030)
pH: 7 (ref 5.0–8.0)

## 2021-01-17 LAB — CBC
HCT: 25.6 % — ABNORMAL LOW (ref 39.0–52.0)
Hemoglobin: 8.4 g/dL — ABNORMAL LOW (ref 13.0–17.0)
MCH: 33.9 pg (ref 26.0–34.0)
MCHC: 32.8 g/dL (ref 30.0–36.0)
MCV: 103.2 fL — ABNORMAL HIGH (ref 80.0–100.0)
Platelets: 129 10*3/uL — ABNORMAL LOW (ref 150–400)
RBC: 2.48 MIL/uL — ABNORMAL LOW (ref 4.22–5.81)
RDW: 17.2 % — ABNORMAL HIGH (ref 11.5–15.5)
WBC: 5.7 10*3/uL (ref 4.0–10.5)
nRBC: 0.4 % — ABNORMAL HIGH (ref 0.0–0.2)

## 2021-01-17 LAB — RESP PANEL BY RT-PCR (FLU A&B, COVID) ARPGX2
Influenza A by PCR: NEGATIVE
Influenza B by PCR: NEGATIVE
SARS Coronavirus 2 by RT PCR: NEGATIVE

## 2021-01-17 LAB — BASIC METABOLIC PANEL
Anion gap: 7 (ref 5–15)
BUN: 24 mg/dL — ABNORMAL HIGH (ref 8–23)
CO2: 26 mmol/L (ref 22–32)
Calcium: 7.7 mg/dL — ABNORMAL LOW (ref 8.9–10.3)
Chloride: 101 mmol/L (ref 98–111)
Creatinine, Ser: 1.11 mg/dL (ref 0.61–1.24)
GFR, Estimated: >60 mL/min (ref >60)
Glucose, Bld: 109 mg/dL — ABNORMAL HIGH (ref 70–99)
Potassium: 3 mmol/L — ABNORMAL LOW (ref 3.5–5.1)
Sodium: 134 mmol/L — ABNORMAL LOW (ref 135–145)

## 2021-01-17 LAB — LACTIC ACID, PLASMA: Lactic Acid, Venous: 1.5 mmol/L (ref 0.5–1.9)

## 2021-01-17 LAB — PROCALCITONIN: Procalcitonin: 0.11 ng/mL

## 2021-01-17 LAB — TROPONIN I (HIGH SENSITIVITY): Troponin I (High Sensitivity): 19 ng/L — ABNORMAL HIGH (ref <18)

## 2021-01-17 MED ORDER — ACETAMINOPHEN 325 MG PO TABS
650.0000 mg | ORAL_TABLET | ORAL | Status: DC | PRN
  Administered 2021-01-18 – 2021-01-19 (×2): 650 mg via ORAL
  Filled 2021-01-17 (×2): qty 2

## 2021-01-17 MED ORDER — IOHEXOL 350 MG/ML SOLN
75.0000 mL | Freq: Once | INTRAVENOUS | Status: AC | PRN
  Administered 2021-01-17: 75 mL via INTRAVENOUS
  Filled 2021-01-17: qty 75

## 2021-01-17 MED ORDER — POTASSIUM CHLORIDE 10 MEQ/100ML IV SOLN
10.0000 meq | INTRAVENOUS | Status: AC
  Administered 2021-01-17 – 2021-01-18 (×2): 10 meq via INTRAVENOUS
  Filled 2021-01-17: qty 100

## 2021-01-17 MED ORDER — ACETAMINOPHEN 650 MG RE SUPP: 650.0000 mg | RECTAL | Status: DC | PRN

## 2021-01-17 MED ORDER — ACETAMINOPHEN 160 MG/5ML PO SOLN
650.0000 mg | ORAL | Status: DC | PRN
  Filled 2021-01-17: qty 20.3

## 2021-01-17 MED ORDER — SODIUM CHLORIDE 0.9 % IV BOLUS (SEPSIS)
2000.0000 mL | Freq: Once | INTRAVENOUS | Status: AC
  Administered 2021-01-17: 2000 mL via INTRAVENOUS

## 2021-01-17 MED ORDER — ENOXAPARIN SODIUM 40 MG/0.4ML IJ SOSY
40.0000 mg | PREFILLED_SYRINGE | INTRAMUSCULAR | Status: DC
  Administered 2021-01-17 – 2021-01-19 (×3): 40 mg via SUBCUTANEOUS
  Filled 2021-01-17 (×3): qty 0.4

## 2021-01-17 MED ORDER — STROKE: EARLY STAGES OF RECOVERY BOOK: Freq: Once | Status: AC

## 2021-01-17 MED ORDER — SODIUM CHLORIDE 0.9 % IV SOLN: INTRAVENOUS | Status: DC

## 2021-01-17 NOTE — ED Triage Notes (Signed)
Pt to ED [facility] from [facility] for "bottoming out" while in bathroom. Pt just recently had surgery for left femur fx. Alert and oriented per EMS

## 2021-01-17 NOTE — H&P (Addendum)
History and Physical    Thomas Matthews RJJ:[id number] DOB: 1924-01-29 DOA: 01/17/2021  PCP: Birdie Sons, MD   Patient coming from: SNF  I have personally briefly reviewed patient's relevant medical records in [place]  Chief Complaint: confusion  HPI: Thomas Matthews is a 87 y.o. male with medical history significant for CAD, diastolic CHF, EF 50 to 01%, chronic A. fib no longer on anticoagulation due to GI bleed requiring transfusions, s/p TAVR 2019 for severe aortic stenosis, HTN, recently hospitalized from 11/27-11/30 with a hip fracture undergoing ORIF on 11/27 with postoperative course complicated by delirium, who was sent in for evaluation of altered mental status, confusion and slurred speech x1 day with concern for CVA.  History is unreliable due to confusion but patient is denying cough or shortness of breath or chest pain, nausea or vomiting or diarrhea.  ED course: On arrival, hypotensive at 90/40 with otherwise normal vitals Blood work mostly unremarkable WBC 5.7 with lactic acid 1.5 and procalcitonin 0.11 Troponin 19 Sodium 134 and potassium 3.0  EKG, personally viewed and interpreted: Atrial fibrillation at a rate of 76 with no acute ST-T wave changes CT head with no acute findings Chest x-ray with minimal blunting left lateral costophrenic angle.  No overt edema  Hospitalist consulted for admission.   Review of Systems: Unable to obtain due to confusion  Assessment/Plan    Acute metabolic encephalopathy -Etiology uncertain, suspecting hypotension related to dehydration. - Differential includes PE though low suspicion for same as patient on prophylactic Lovenox.  Delirium also possible  - No stigmata of infection - CT head with no acute pathology and physical exam is nonfocal - We will get CTA chest given no other etiology identifiable at this time - Neurologic checks, continuous cardiac monitoring - Stroke work-up: Continuous cardiac monitoring and  carotid  Doppler.  Cannot get MRI due to pacemaker-and had echo in October   Hypokalemia - IV repletion  S/p hip repair on 11/27 - Continue PT when improved - Pain control - Consider inpatient Ortho consult    Cardiac pacemaker in situ - We will get pacemaker check    CAD (coronary artery disease) - Troponin slightly elevated at 19 but patient appears comfortable on having no chest pain and EKG is nonacute - Continue to trend troponin -IV hydration  Hypotension -    History of GI bleed - H&H 8.4 which is his baseline - Continue to monitor    S/P TAVR (transcatheter aortic valve replacement) - No acute disease suspected    Atrial fibrillation, chronic (HCC) - Hold metoprolol due to hypotension - Continue amiodarone - Not currently on anticoagulation given history of GI bleed    Chronic diastolic CHF (congestive heart failure) (HCC) - Currently euvolemic to dry - Hold metoprolol due to hypotension    DVT prophylaxis: Lovenox  Code Status: DNR Family Communication:  none  Disposition Plan: Back to previous home environment Consults called: none  Status:At the time of admission, it appears that the appropriate admission status for this patient is INPATIENT. This is judged to be reasonable and necessary in order to provide the required intensity of service to ensure the patient's safety given the presenting symptoms, physical exam findings, and initial radiographic and laboratory data in the context of their  Comorbid conditions.   Patient requires inpatient status due to high intensity of service, high risk for further deterioration and high frequency of surveillance required.   I certify that at the point of admission  it is my clinical judgment that the patient will require inpatient hospital care spanning beyond 2 midnights     Physical Exam: Vitals:   01/17/21 1900 01/17/21 1915 01/17/21 1930 01/17/21 1943  BP: 137/71  139/67   Pulse:  71 70 71  Resp: 12 16 15 19    Temp:      TempSrc:      SpO2:  95% 91% 94%  Weight:      Height:       Constitutional: Frail, undernourished and appears dehydrated, oriented x 2. Not in any apparent distress HEENT:      Head: Normocephalic and atraumatic.         Eyes: PERLA, EOMI, Conjunctivae are normal. Sclera is non-icteric.       Mouth/Throat: Mucous membranes are dry      Neck: Supple with no signs of meningismus. Cardiovascular: Regular rate and rhythm. No murmurs, gallops, or rubs. 2+ symmetrical distal pulses are present . No JVD. No  LE edema Respiratory: Respiratory effort normal .Lungs sounds clear bilaterally. No wheezes, crackles, or rhonchi.  Gastrointestinal: Soft, non tender, non distended. Positive bowel sounds.  Genitourinary: No CVA tenderness. Musculoskeletal: Nontender with normal range of motion in all extremities. No cyanosis, or erythema of extremities. Neurologic:  Face is symmetric. Moving all extremities. No gross focal neurologic deficits . Skin: Skin is warm, dry.  No rash or ulcers Psychiatric: Mood and affect are appropriate     History reviewed. No pertinent past medical history.  Past Surgical History:  Procedure Laterality Date   INTRAMEDULLARY (IM) NAIL INTERTROCHANTERIC Left 01/12/2021   Procedure: INTRAMEDULLARY (IM) NAIL INTERTROCHANTRIC;  Surgeon: Corky Mull, MD;  Location: [facility] ORS;  Service: Orthopedics;  Laterality: Left;     reports that he has quit smoking. He has never used smokeless tobacco. He reports that he does not drink alcohol and does not use drugs.  Allergies  Allergen Reactions   Amlodipine Besylate Swelling   Felodipine Swelling   Oxybutynin Chloride Other (See Comments)    Other reaction(s): Dizziness Tolerates taking at night   Simvastatin     Other reaction(s): Dizziness   Terazosin     Other reaction(s): Low blood pressure    No family history on file.  Family history unable to obtain due to confusion   Prior to Admission medications    Medication Sig Start Date End Date Taking? Authorizing Provider  amiodarone (PACERONE) 200 MG tablet Take 1 tablet (200 mg total) by mouth 2 (two) times daily. 12/17/20  Yes [place] Donnamarie Poag, DO  calcium citrate-vitamin D (CITRACAL+D) 315-200 MG-UNIT tablet Take 2 tablets by mouth daily.   Yes [provider]  cholecalciferol (VITAMIN D3) 25 MCG (1000 UNIT) tablet Take 2,000 Units by mouth daily.   Yes [provider]  enoxaparin (LOVENOX) 30 MG/0.3ML injection Inject 0.3 mLs (30 mg total) into the skin daily. 01/15/21  Yes Lattie Corns, PA-C  ferrous sulfate 325 (65 FE) MG tablet Take 1 tablet (325 mg total) by mouth 2 (two) times daily with a meal. 12/17/20  Yes [place] Eric J, DO  furosemide (LASIX) 40 MG tablet Take 1 tablet (40 mg total) by mouth daily. 12/17/20  Yes [place] Donnamarie Poag, DO  HYDROcodone-acetaminophen (NORCO/VICODIN) 5-325 MG tablet Take 1 tablet by mouth every 6 (six) hours as needed for moderate pain. 01/15/21  Yes Lattie Corns, PA-C  metoprolol tartrate (LOPRESSOR) 25 MG tablet Take 2 tablets (50 mg total) by mouth 2 (two) times daily.  12/17/20 12/17/21 Yes [place] Eric J, DO  pantoprazole (PROTONIX) 40 MG tablet Take 40 mg by mouth 2 (two) times daily.   Yes [provider]  pravastatin (PRAVACHOL) 20 MG tablet Take 10 mg by mouth daily.   Yes [provider]  senna-docusate (SENOKOT-S) 8.6-50 MG tablet Take 1 tablet by mouth daily.   Yes [provider]  tamsulosin (FLOMAX) 0.4 MG CAPS capsule Take 0.4 mg by mouth daily after breakfast. 11/11/11  Yes [provider]  tolterodine (DETROL LA) 4 MG 24 hr capsule Take 4 mg by mouth daily. 03/14/20  Yes [provider]  pantoprazole (PROTONIX) 40 MG tablet Take 1 tablet (40 mg total) by mouth 2 (two) times daily for 28 days, THEN 1 tablet (40 mg total) 2 (two) times daily. Patient not taking: Reported on 01/17/2021 12/17/20 04/14/21  [place] Eric J, DO       Labs on Admission: I have personally reviewed following labs and imaging studies  CBC: Recent Labs  Lab 01/12/21 0817 01/13/21 0851 01/14/21 0153 01/15/21 0236 01/17/21 1501  WBC 6.0 10.9* 8.1 7.2 5.7  HGB 12.4* 8.5* 7.8* 7.5* 8.4*  HCT 37.1* 24.9* 22.4* 22.1* 25.6*  MCV 99.7 100.0 97.8 98.2 103.2*  PLT 145* 108* 99* 98* 681*   Basic Metabolic Panel: Recent Labs  Lab 01/12/21 0817 01/13/21 0851 01/14/21 0153 01/15/21 0236 01/17/21 1501  NA 134* 130* 129* 129* 134*  K 3.9 4.1 3.7 3.4* 3.0*  CL 102 101 99 99 101  CO2 25 25 23 26 26   GLUCOSE 103* 113* 104* 97 109*  BUN 15 18 22 21  24*  CREATININE 1.13 0.99 1.20 1.24 1.11  CALCIUM 8.4* 7.4* 7.5* 7.4* 7.7*   GFR: Estimated Creatinine Clearance: 33.2 mL/min (by C-G formula based on SCr of 1.11 mg/dL). Liver Function Tests: Recent Labs  Lab 01/14/21 0153  AST 16  ALT 5  ALKPHOS 46  BILITOT 0.6  PROT 5.0*  ALBUMIN 2.5*   No results for input(s): LIPASE, AMYLASE in the last 168 hours. No results for input(s): AMMONIA in the last 168 hours. Coagulation Profile: No results for input(s): INR, PROTIME in the last 168 hours. Cardiac Enzymes: No results for input(s): CKTOTAL, CKMB, CKMBINDEX, TROPONINI in the last 168 hours. BNP (last 3 results) No results for input(s): PROBNP in the last 8760 hours. HbA1C: No results for input(s): HGBA1C in the last 72 hours. CBG: No results for input(s): GLUCAP in the last 168 hours. Lipid Profile: No results for input(s): CHOL, HDL, LDLCALC, TRIG, CHOLHDL, LDLDIRECT in the last 72 hours. Thyroid Function Tests: No results for input(s): TSH, T4TOTAL, FREET4, T3FREE, THYROIDAB in the last 72 hours. Anemia Panel: No results for input(s): VITAMINB12, FOLATE, FERRITIN, TIBC, IRON, RETICCTPCT in the last 72 hours. Urine analysis:    Component Value Date/Time   COLORURINE YELLOW (A) 01/17/2021 1629   APPEARANCEUR CLEAR (A) 01/17/2021 1629   APPEARANCEUR Clear 10/13/2011  1105   LABSPEC 1.009 01/17/2021 1629   LABSPEC 1.008 10/13/2011 1105   PHURINE 7.0 01/17/2021 1629   GLUCOSEU NEGATIVE 01/17/2021 1629   GLUCOSEU Negative 10/13/2011 1105   HGBUR NEGATIVE 01/17/2021 1629   BILIRUBINUR NEGATIVE 01/17/2021 1629   BILIRUBINUR Negative 10/13/2011 1105   KETONESUR NEGATIVE 01/17/2021 1629   PROTEINUR NEGATIVE 01/17/2021 1629   NITRITE NEGATIVE 01/17/2021 1629   LEUKOCYTESUR NEGATIVE 01/17/2021 1629   LEUKOCYTESUR Negative 10/13/2011 1105    Radiological Exams on Admission: DG Chest 1 View  Result Date: 01/17/2021 CLINICAL DATA:  Weakness, cough, altered mental status EXAM: CHEST  1 VIEW COMPARISON:  01/12/2021 FINDINGS: Dual lead pacer noted. Aortic valve prosthesis. Mild enlargement of the cardiopericardial silhouette is observed with indistinctness of the pulmonary vasculature potentially reflecting pulmonary venous hypertension. No Kerley B lines or overt interstitial edema observed. Atherosclerotic calcification of the aortic arch. Previous blunting of the left lateral costophrenic angle is less readily apparent on today's exam although there may be some minimal blunting. The area is partially obscured by ECG leads. Reduced acromial humeral distance bilaterally, often seen in the setting of chronic rotator cuff tears. IMPRESSION: 1. Mild enlargement of the cardiopericardial silhouette with pulmonary venous hypertension but no overt edema. 2. Minimal blunting of the left lateral costophrenic angle although improved from 01/12/2021. Slightly represents a trace pleural effusion. 3.  Aortic Atherosclerosis (ICD10-I70.0). 4. Probable bilateral chronic rotator cuff tears. Electronically Signed   By: Van Clines M.D.   On: 01/17/2021 16:50   CT Head Wo Contrast  Result Date: 01/17/2021 CLINICAL DATA:  Neuro deficit, acute, stroke suspected Mental status change, unknown cause EXAM: CT HEAD WITHOUT CONTRAST TECHNIQUE: Contiguous axial images were obtained from  the base of the skull through the vertex without intravenous contrast. COMPARISON:  CT head 01/12/2021 BRAIN: BRAIN Cerebral ventricle sizes are concordant with the degree of cerebral volume loss. Patchy and confluent areas of decreased attenuation are noted throughout the deep and periventricular white matter of the cerebral hemispheres bilaterally, compatible with chronic microvascular ischemic disease. No evidence of large-territorial acute infarction. No parenchymal hemorrhage. No mass lesion. No extra-axial collection. No mass effect or midline shift. No hydrocephalus. Basilar cisterns are patent. Vascular: No hyperdense vessel. Atherosclerotic calcifications are present within the cavernous internal carotid and vertebral arteries. Skull: No acute fracture or focal lesion. Sinuses/Orbits: Paranasal sinuses and mastoid air cells are clear. Bilateral lens replacement. Orbits are unremarkable. Other: None. IMPRESSION: No acute intracranial abnormality. Electronically Signed   By: Iven Finn M.D.   On: 01/17/2021 16:04       Athena Masse MD [facility]   01/17/2021, 8:19 PM

## 2021-01-17 NOTE — ED Provider Notes (Signed)
[facility] Emergency Department Provider Note  ____________________________________________  Time seen: Approximately 5:54 PM  I have reviewed the triage vital signs and the nursing notes.   HISTORY  Chief Complaint Hypotension    Level 5 Caveat: Portions of the History and Physical including HPI and review of systems are unable to be completely obtained due to patient being a poor historian   HPI Thomas Matthews is a 87 y.o. male with a past history of atrial fibrillation, CHF, COPD who was brought to the ED due to syncope while sitting on the toilet today at [employer].  He did not fall.  He was noted by EMS to be hypotensive with a blood pressure of 90/40.  Daughter at bedside notes that he has very little fluid intake over the past few weeks since having surgery for a  femur fracture.    History reviewed. No pertinent past medical history.   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   Acid reflux 07/27/2017   Acne erythematosa 07/27/2017   Flutter-fibrillation 07/27/2017   BP (high blood pressure) 07/27/2017   Cardiac pacemaker in situ 07/27/2017   CAD (coronary artery disease) 07/27/2017   DD (diverticular disease) 07/27/2017   History of GI bleed 04/16/2008   Benign neoplasm of colon 02/27/2004     Past Surgical History:  Procedure Laterality Date    INTRAMEDULLARY (IM) NAIL INTERTROCHANTERIC Left 01/12/2021   Procedure: INTRAMEDULLARY (IM) NAIL INTERTROCHANTRIC;  Surgeon: Corky Mull, MD;  Location: [facility] ORS;  Service: Orthopedics;  Laterality: Left;     Prior to Admission medications   Medication Sig Start Date End Date Taking? Authorizing Provider  amiodarone (PACERONE) 200 MG tablet Take 1 tablet (200 mg total) by mouth 2 (two) times daily. 12/17/20  Yes [place] Donnamarie Poag, DO  calcium citrate-vitamin D (CITRACAL+D) 315-200 MG-UNIT tablet Take 2 tablets by mouth daily.   Yes [provider]  cholecalciferol (VITAMIN D3) 25 MCG (1000 UNIT) tablet Take 2,000 Units by mouth daily.   Yes [provider]  enoxaparin (LOVENOX) 30 MG/0.3ML injection Inject 0.3 mLs (30 mg total) into the skin daily. 01/15/21  Yes Lattie Corns, PA-C  ferrous sulfate 325 (65 FE) MG tablet Take 1 tablet (325 mg total) by mouth 2 (two) times daily with a meal. 12/17/20  Yes [place] Eric J, DO  furosemide (LASIX) 40 MG tablet Take 1 tablet (40 mg total) by mouth daily. 12/17/20  Yes [place] Donnamarie Poag, DO  HYDROcodone-acetaminophen (NORCO/VICODIN) 5-325 MG tablet Take 1 tablet by mouth every 6 (six) hours as needed for moderate pain. 01/15/21  Yes Lattie Corns, PA-C  metoprolol tartrate (LOPRESSOR) 25 MG tablet Take 2 tablets (50 mg total) by mouth 2 (two) times daily. 12/17/20 12/17/21 Yes [place] Eric J, DO  pantoprazole (PROTONIX) 40 MG tablet Take 40 mg by mouth 2 (two) times daily.   Yes [provider]  pravastatin (PRAVACHOL) 20 MG tablet Take 10 mg by mouth daily.  Yes [provider]  senna-docusate (SENOKOT-S) 8.6-50 MG tablet Take 1 tablet by mouth daily.   Yes [provider]  tamsulosin (FLOMAX) 0.4 MG CAPS capsule Take 0.4 mg by mouth daily after breakfast. 11/11/11  Yes [provider]  tolterodine (DETROL LA) 4 MG 24 hr capsule Take 4 mg by mouth daily. 03/14/20  Yes [provider]   pantoprazole (PROTONIX) 40 MG tablet Take 1 tablet (40 mg total) by mouth 2 (two) times daily for 28 days, THEN 1 tablet (40 mg total) 2 (two) times daily. Patient not taking: Reported on 01/17/2021 12/17/20 04/14/21  [place] Eric J, DO     Allergies Amlodipine besylate, Felodipine, Oxybutynin chloride, Simvastatin, and Terazosin   No family history on file.  Social History Social History   Tobacco Use   Smoking status: Former   Smokeless tobacco: Never  Substance Use Topics   Alcohol use: No   Drug use: No    Review of Systems Level 5 Caveat: Portions of the History and Physical including HPI and review of systems are unable to be completely obtained due to patient being a poor historian   Constitutional:   No known fever.  ENT:   No rhinorrhea. Cardiovascular:   No chest pain or syncope. Respiratory:   No dyspnea or cough. Gastrointestinal:   Negative for abdominal pain, vomiting and diarrhea.  Musculoskeletal: Left leg pain ____________________________________________   PHYSICAL EXAM:  VITAL SIGNS: ED Triage Vitals  Enc Vitals Group     BP 01/17/21 1505 (!) 90/40     Pulse Rate 01/17/21 1459 64     Resp 01/17/21 1459 20     Temp 01/17/21 1459 97.9 F (36.6 C)     Temp Source 01/17/21 1459 Oral     SpO2 01/17/21 1459 96 %     Weight 01/17/21 1500 130 lb (59 kg)     Height 01/17/21 1500 5\' 7"  (1.702 m)     Head Circumference --      Peak Flow --      Pain Score 01/17/21 1459 0     Pain Loc --      Pain Edu? --      Excl. in [place]? --     Vital signs reviewed, nursing assessments reviewed.   Constitutional:   Somnolent, arousable.  Not oriented.  Ill-appearing.. Eyes:   Conjunctivae are normal. EOMI. PERRL. ENT      Head:   Normocephalic and atraumatic.      Nose:   No congestion/rhinnorhea.       Mouth/Throat:   Dry mucous membranes, no pharyngeal erythema. No peritonsillar mass.       Neck:   No meningismus. Full  ROM. Hematological/Lymphatic/Immunilogical:   No cervical lymphadenopathy. Cardiovascular:   RRR. Symmetric bilateral radial and DP pulses.  No murmurs. Cap refill less than 2 seconds. Respiratory:   Normal respiratory effort without tachypnea/retractions. Breath sounds are clear and equal bilaterally. No wheezes/rales/rhonchi. Gastrointestinal:   Soft and nontender. Non distended. There is no CVA tenderness.  No rebound, rigidity, or guarding.  Rectal exam revealed brown stool, Hemoccult negative Genitourinary:   deferred Musculoskeletal:   Normal range of motion in all extremities. No joint effusions.  No lower extremity tenderness.  No edema.  No bony point tenderness.  Compartments are soft.  No ecchymosis edema or hematoma of the left thigh Neurologic:   slurred speech  Motor grossly intact. No acute focal neurologic deficits are appreciated.  Skin:    Skin  is warm, dry and intact. No rash noted.  No petechiae, purpura, or bullae.  ____________________________________________    LABS (pertinent positives/negatives) (all labs ordered are listed, but only abnormal results are displayed) Labs Reviewed  BASIC METABOLIC PANEL - Abnormal; Notable for the following components:      Result Value   Sodium 134 (*)    Potassium 3.0 (*)    Glucose, Bld 109 (*)    BUN 24 (*)    Calcium 7.7 (*)    All other components within normal limits  CBC - Abnormal; Notable for the following components:   RBC 2.48 (*)    Hemoglobin 8.4 (*)    HCT 25.6 (*)    MCV 103.2 (*)    RDW 17.2 (*)    Platelets 129 (*)    nRBC 0.4 (*)    All other components within normal limits  URINALYSIS, ROUTINE W REFLEX MICROSCOPIC - Abnormal; Notable for the following components:   Color, Urine YELLOW (*)    APPearance CLEAR (*)    All other components within normal limits  TROPONIN I (HIGH SENSITIVITY) - Abnormal; Notable for the following components:   Troponin I (High Sensitivity) 19 (*)    All other components  within normal limits  RESP PANEL BY RT-PCR (FLU A&B, COVID) ARPGX2  CULTURE, BLOOD (ROUTINE X 2)  CULTURE, BLOOD (ROUTINE X 2)  LACTIC ACID, PLASMA  PROCALCITONIN  HEMOGLOBIN A1C  LIPID PANEL  BASIC METABOLIC PANEL   ____________________________________________   EKG  Interpreted by me Atrial fibrillation rate of 76.  Normal axis and intervals.  Normal QRS ST segments and T waves.  3 PVCs on the strip.  ____________________________________________    RADIOLOGY  DG Chest 1 View  Result Date: 01/17/2021 CLINICAL DATA:  Weakness, cough, altered mental status EXAM: CHEST  1 VIEW COMPARISON:  01/12/2021 FINDINGS: Dual lead pacer noted. Aortic valve prosthesis. Mild enlargement of the cardiopericardial silhouette is observed with indistinctness of the pulmonary vasculature potentially reflecting pulmonary venous hypertension. No Kerley B lines or overt interstitial edema observed. Atherosclerotic calcification of the aortic arch. Previous blunting of the left lateral costophrenic angle is less readily apparent on today's exam although there may be some minimal blunting. The area is partially obscured by ECG leads. Reduced acromial humeral distance bilaterally, often seen in the setting of chronic rotator cuff tears. IMPRESSION: 1. Mild enlargement of the cardiopericardial silhouette with pulmonary venous hypertension but no overt edema. 2. Minimal blunting of the left lateral costophrenic angle although improved from 01/12/2021. Slightly represents a trace pleural effusion. 3.  Aortic Atherosclerosis (ICD10-I70.0). 4. Probable bilateral chronic rotator cuff tears. Electronically Signed   By: Van Clines M.D.   On: 01/17/2021 16:50   CT Head Wo Contrast  Result Date: 01/17/2021 CLINICAL DATA:  Neuro deficit, acute, stroke suspected Mental status change, unknown cause EXAM: CT HEAD WITHOUT CONTRAST TECHNIQUE: Contiguous axial images were obtained from the base of the skull through the  vertex without intravenous contrast. COMPARISON:  CT head 01/12/2021 BRAIN: BRAIN Cerebral ventricle sizes are concordant with the degree of cerebral volume loss. Patchy and confluent areas of decreased attenuation are noted throughout the deep and periventricular white matter of the cerebral hemispheres bilaterally, compatible with chronic microvascular ischemic disease. No evidence of large-territorial acute infarction. No parenchymal hemorrhage. No mass lesion. No extra-axial collection. No mass effect or midline shift. No hydrocephalus. Basilar cisterns are patent. Vascular: No hyperdense vessel. Atherosclerotic calcifications are present within the cavernous internal carotid and vertebral  arteries. Skull: No acute fracture or focal lesion. Sinuses/Orbits: Paranasal sinuses and mastoid air cells are clear. Bilateral lens replacement. Orbits are unremarkable. Other: None. IMPRESSION: No acute intracranial abnormality. Electronically Signed   By: Iven Finn M.D.   On: 01/17/2021 16:04   CT Angio Chest Pulmonary Embolism (PE) W or WO Contrast  Result Date: 01/17/2021 CLINICAL DATA:  Recent syncopal episode EXAM: CT ANGIOGRAPHY CHEST WITH CONTRAST TECHNIQUE: Multidetector CT imaging of the chest was performed using the standard protocol during bolus administration of intravenous contrast. Multiplanar CT image reconstructions and MIPs were obtained to evaluate the vascular anatomy. CONTRAST:  78mL OMNIPAQUE IOHEXOL 350 MG/ML SOLN COMPARISON:  Chest x-ray from earlier in the same day. FINDINGS: Cardiovascular: Atherosclerotic calcifications of the thoracic aorta are noted without evidence of aneurysmal dilatation. Changes of prior TAVR are noted. The pulmonary artery shows a normal branching pattern without intraluminal filling defect to suggest pulmonary embolism. Coronary calcifications are noted. No pericardial effusion is seen. Mild cardiomegaly is noted. Pacing device is noted. Mediastinum/Nodes:  Thoracic inlet is within normal limits. No sizable hilar or mediastinal adenopathy is noted. The esophagus shows evidence of a sliding-type hiatal hernia. No other focal esophageal abnormality is noted. Lungs/Pleura: Emphysematous changes of the lungs are noted bilaterally. Bilateral pleural effusions are noted right greater than left. Bibasilar atelectatic changes are seen as well. No sizable parenchymal nodule is noted. Upper Abdomen: Gallbladder has been surgically removed. The remainder of the visualized upper abdomen appears within normal limits. Musculoskeletal: Degenerative changes of the thoracic spine are noted. No acute rib abnormality is noted. Review of the MIP images confirms the above findings. IMPRESSION: No evidence of pulmonary emboli. Bilateral pleural effusions with bibasilar atelectatic changes. Aortic Atherosclerosis (ICD10-I70.0) and Emphysema (ICD10-J43.9). Electronically Signed   By: Inez Catalina M.D.   On: 01/17/2021 21:03    ____________________________________________   PROCEDURES .Critical Care Performed by: Carrie Mew, MD Authorized by: Carrie Mew, MD   Critical care provider statement:    Critical care time (minutes):  32   Critical care time was exclusive of:  Separately billable procedures and treating other patients   Critical care was necessary to treat or prevent imminent or life-threatening deterioration of the following conditions:  Circulatory failure, CNS failure or compromise and dehydration   Critical care was time spent personally by me on the following activities:  Development of treatment plan with patient or surrogate, discussions with consultants, evaluation of patient's response to treatment, examination of patient, obtaining history from patient or surrogate, ordering and performing treatments and interventions, ordering and review of laboratory studies, ordering and review of radiographic studies, pulse oximetry, re-evaluation of patient's  condition and review of old charts  ____________________________________________  DIFFERENTIAL DIAGNOSIS   Intracranial hemorrhage, pneumonia, UTI, dehydration, electrolyte abnormality, viral illness, stroke  CLINICAL IMPRESSION / ASSESSMENT AND PLAN / ED COURSE  Medications ordered in the ED: Medications  0.9 %  sodium chloride infusion ( Intravenous New Bag/Given 01/17/21 2328)  acetaminophen (TYLENOL) tablet 650 mg (has no administration in time range)    Or  acetaminophen (TYLENOL) 160 MG/5ML solution 650 mg (has no administration in time range)    Or  acetaminophen (TYLENOL) suppository 650 mg (has no administration in time range)  enoxaparin (LOVENOX) injection 40 mg (40 mg Subcutaneous Given 01/17/21 2320)  potassium chloride 10 mEq in 100 mL IVPB (10 mEq Intravenous New Bag/Given 01/17/21 2332)  sodium chloride 0.9 % bolus 2,000 mL (0 mLs Intravenous Stopped 01/17/21 1726)  iohexol (OMNIPAQUE)  350 MG/ML injection 75 mL (75 mLs Intravenous Contrast Given 01/17/21 2048)   stroke: mapping our early stages of recovery book ( Does not apply Given 01/17/21 2334)    Pertinent labs & imaging results that were available during my care of the patient were reviewed by me and considered in my medical decision making (see chart for details).   Thomas Matthews was evaluated in Emergency Department on 01/17/2021 for the symptoms described in the history of present illness. He was evaluated in the context of the global COVID-19 pandemic, which necessitated consideration that the patient might be at risk for infection with the SARS-CoV-2 virus that causes COVID-19. Institutional protocols and algorithms that pertain to the evaluation of patients at risk for COVID-19 are in a state of rapid change based on information released by regulatory bodies including the CDC and federal and state organizations. These policies and algorithms were followed during the patient's care in the ED.   Patient presents  with altered mental status, slurred speech, generalized weakness.  Found to be hypotensive, requiring 2 L IV fluid bolus for resuscitation.  CT scan of the head and chest x-ray unremarkable.  Labs do not show any acute findings.  Baseline chronic anemia.  Clinically he appears dehydrated.  Patient given IV fluids, case discussed with hospitalist for further evaluation.      ____________________________________________   FINAL CLINICAL IMPRESSION(S) / ED DIAGNOSES    Final diagnoses:  Hypotension, unspecified hypotension type  Altered mental status, unspecified altered mental status type     ED Discharge Orders     None       Portions of this note were generated with dragon dictation software. Dictation errors may occur despite best attempts at proofreading.   Carrie Mew, MD 01/17/21 [phone]

## 2021-01-17 NOTE — ED Notes (Signed)
This RN came to draw cultures/lactic and start IVF. Pt not in room at this time.

## 2021-01-17 NOTE — Progress Notes (Signed)
Patient transported to unit by ED staff. Patient alert, oriented to himself which is baseline, NAD, RA, Rate controlled afib on tele.

## 2021-01-18 ENCOUNTER — Inpatient Hospital Stay: Payer: Medicare Other

## 2021-01-18 ENCOUNTER — Encounter: Payer: Self-pay | Admitting: Internal Medicine

## 2021-01-18 LAB — BASIC METABOLIC PANEL
Anion gap: 8 (ref 5–15)
BUN: 20 mg/dL (ref 8–23)
CO2: 25 mmol/L (ref 22–32)
Calcium: 7.3 mg/dL — ABNORMAL LOW (ref 8.9–10.3)
Chloride: 103 mmol/L (ref 98–111)
Creatinine, Ser: 0.94 mg/dL (ref 0.61–1.24)
GFR, Estimated: >60 mL/min (ref >60)
Glucose, Bld: 89 mg/dL (ref 70–99)
Potassium: 3.1 mmol/L — ABNORMAL LOW (ref 3.5–5.1)
Sodium: 136 mmol/L (ref 135–145)

## 2021-01-18 LAB — LIPID PANEL
Cholesterol: 82 mg/dL (ref 0–200)
HDL: 31 mg/dL — ABNORMAL LOW (ref >40)
LDL Cholesterol: 43 mg/dL (ref 0–99)
Total CHOL/HDL Ratio: 2.6 RATIO
Triglycerides: 38 mg/dL (ref <150)
VLDL: 8 mg/dL (ref 0–40)

## 2021-01-18 LAB — MRSA NEXT GEN BY PCR, NASAL: MRSA by PCR Next Gen: NOT DETECTED

## 2021-01-18 MED ORDER — AMIODARONE HCL 200 MG PO TABS
200.0000 mg | ORAL_TABLET | Freq: Two times a day (BID) | ORAL | Status: DC
  Administered 2021-01-18 – 2021-01-20 (×5): 200 mg via ORAL
  Filled 2021-01-18 (×5): qty 1

## 2021-01-18 MED ORDER — FERROUS SULFATE 325 (65 FE) MG PO TABS
325.0000 mg | ORAL_TABLET | Freq: Two times a day (BID) | ORAL | Status: DC
  Administered 2021-01-18 – 2021-01-20 (×4): 325 mg via ORAL
  Filled 2021-01-18 (×4): qty 1

## 2021-01-18 MED ORDER — TAMSULOSIN HCL 0.4 MG PO CAPS
0.4000 mg | ORAL_CAPSULE | Freq: Every day | ORAL | Status: DC
  Administered 2021-01-18 – 2021-01-20 (×3): 0.4 mg via ORAL
  Filled 2021-01-18 (×3): qty 1

## 2021-01-18 MED ORDER — METOPROLOL TARTRATE 50 MG PO TABS
50.0000 mg | ORAL_TABLET | Freq: Two times a day (BID) | ORAL | Status: DC
  Administered 2021-01-18 (×2): 50 mg via ORAL
  Filled 2021-01-18 (×2): qty 1

## 2021-01-18 MED ORDER — HYDROCODONE-ACETAMINOPHEN 5-325 MG PO TABS
1.0000 | ORAL_TABLET | Freq: Four times a day (QID) | ORAL | Status: DC | PRN
  Administered 2021-01-18 – 2021-01-19 (×2): 1 via ORAL
  Filled 2021-01-18 (×2): qty 1

## 2021-01-18 MED ORDER — SENNOSIDES-DOCUSATE SODIUM 8.6-50 MG PO TABS
1.0000 | ORAL_TABLET | Freq: Every day | ORAL | Status: DC
  Administered 2021-01-18 – 2021-01-20 (×3): 1 via ORAL
  Filled 2021-01-18 (×3): qty 1

## 2021-01-18 MED ORDER — FUROSEMIDE 40 MG PO TABS
40.0000 mg | ORAL_TABLET | Freq: Every day | ORAL | Status: DC
  Administered 2021-01-18: 09:00:00 40 mg via ORAL
  Filled 2021-01-18: qty 1

## 2021-01-18 MED ORDER — PANTOPRAZOLE SODIUM 40 MG PO TBEC
40.0000 mg | DELAYED_RELEASE_TABLET | Freq: Every day | ORAL | Status: DC
  Administered 2021-01-18 – 2021-01-20 (×3): 40 mg via ORAL
  Filled 2021-01-18 (×3): qty 1

## 2021-01-18 NOTE — Progress Notes (Signed)
PROGRESS NOTE    Thomas Matthews  CZY:[id number] DOB: [birth year] DOA: 01/17/2021 PCP: Birdie Sons, MD    Brief Narrative:  87 y.o. male with medical history significant for CAD, diastolic CHF, EF 50 to 09%, chronic A. fib no longer on anticoagulation due to GI bleed requiring transfusions, s/p TAVR 2019 for severe aortic stenosis, HTN, recently hospitalized from 11/27-11/30 with a hip fracture undergoing ORIF on 11/27 with postoperative course complicated by delirium, who was sent in for evaluation of altered mental status, confusion and slurred speech x1 day with concern for CVA.  History is unreliable due to confusion but patient is denying cough or shortness of breath or chest pain, nausea or vomiting or diarrhea.  Low clinical suspicion for CVA.  Suspect postoperative delirium secondary to poor p.o. intake and possible narcotic use for pain control.   Assessment & Plan:   Principal Problem:   Acute metabolic encephalopathy Active Problems:   Cardiac pacemaker in situ   CAD (coronary artery disease)   History of GI bleed   S/P TAVR (transcatheter aortic valve replacement)   Atrial fibrillation, chronic (HCC)   Chronic diastolic CHF (congestive heart failure) (HCC)   COPD (chronic obstructive pulmonary disease) (HCC)   Hypokalemia  Acute metabolic encephalopathy Uncertain etiology, low suspicion for CVA PE ruled out No signs of infection Suspect hypotension related to dehydration and poor p.o. intake Possible contribution from pain medication at SNF CT head reassuring Plan: Continue telemetry Check carotid Dopplers Minimize narcotic use Therapy evaluations    Hypokalemia Monitor and replace as necessary   S/p hip repair on 11/27 Therapy evaluations as able Pain control   Cardiac pacemaker in situ Device check     CAD (coronary artery disease) Minimal troponin elevation with no delta  No suspicion for ACS Telemetry monitoring for now gently  hydrate  Hypotension Likely underlying his syncopal event Blood pressure has improved Slowly restart home blood pressure regimen  History of GI bleed Hemoglobin baseline  Severe aortic stenosis status post TAVR Outpatient follow-up     Atrial fibrillation, chronic (HCC) Metoprolol restarted as hypotension is improved Continue home amiodarone Not a candidate for anticoagulation secondary to advanced age and history of GI bleed   Chronic diastolic congestive heart failure Cautiously restart home diuretics PTA metoprolol   DVT prophylaxis: SQ Lovenox Code Status: DNR Family Communication: Daughter Neoma Laming [phone] on 12/3 Disposition Plan: Status is: Inpatient  Remains inpatient appropriate because: Acute syncopal event.  Unclear etiology.  Unable to totally rule out stroke.  Clinical indications of vasovagal syncope and orthostatic hypotension.  Anticipated date of discharge back to skilled nursing facility 12/4       Level of care: Telemetry Medical  Consultants:  None  Procedures:  None  Antimicrobials: None   Subjective: Patient seen and examined.  Resting comfortably in bed.  Alert, oriented x3  Objective: Vitals:   01/18/21 0210 01/18/21 0454 01/18/21 0539 01/18/21 0735  BP: (!) 147/79 132/69 137/68 (!) 157/69  Pulse: 83 85 84 73  Resp: 20 15 18 17   Temp: 98.1 F (36.7 C) 98.3 F (36.8 C) 97.6 F (36.4 C) 98 F (36.7 C)  TempSrc: Oral Oral Oral   SpO2: 96% 95% 93% 98%  Weight:      Height:        Intake/Output Summary (Last 24 hours) at 01/18/2021 1006 Last data filed at 01/18/2021 0318 Gross per 24 hour  Intake 2200.27 ml  Output 1000 ml  Net 1200.27 ml  Filed Weights   01/17/21 1500  Weight: 59 kg    Examination:  General exam: No acute distress.  Appears stated age Respiratory system: Lungs clear.  Normal work of breathing.  Room air Cardiovascular system: S1-S2, regular rate, irregular rhythm, no murmurs, no pedal  edema Gastrointestinal system: Soft, NT/ND, normal bowel sounds Central nervous system: Alert and oriented. No focal neurological deficits. Extremities: Symmetric 5 x 5 power. Skin: No rashes, lesions or ulcers Psychiatry: Judgement and insight appear normal. Mood & affect appropriate.     Data Reviewed: I have personally reviewed following labs and imaging studies  CBC: Recent Labs  Lab 01/12/21 0817 01/13/21 0851 01/14/21 0153 01/15/21 0236 01/17/21 1501  WBC 6.0 10.9* 8.1 7.2 5.7  HGB 12.4* 8.5* 7.8* 7.5* 8.4*  HCT 37.1* 24.9* 22.4* 22.1* 25.6*  MCV 99.7 100.0 97.8 98.2 103.2*  PLT 145* 108* 99* 98* 970*   Basic Metabolic Panel: Recent Labs  Lab 01/13/21 0851 01/14/21 0153 01/15/21 0236 01/17/21 1501 01/18/21 0602  NA 130* 129* 129* 134* 136  K 4.1 3.7 3.4* 3.0* 3.1*  CL 101 99 99 101 103  CO2 25 23 26 26 25   GLUCOSE 113* 104* 97 109* 89  BUN 18 22 21  24* 20  CREATININE 0.99 1.20 1.24 1.11 0.94  CALCIUM 7.4* 7.5* 7.4* 7.7* 7.3*   GFR: Estimated Creatinine Clearance: 39.2 mL/min (by C-G formula based on SCr of 0.94 mg/dL). Liver Function Tests: Recent Labs  Lab 01/14/21 0153  AST 16  ALT 5  ALKPHOS 46  BILITOT 0.6  PROT 5.0*  ALBUMIN 2.5*   No results for input(s): LIPASE, AMYLASE in the last 168 hours. No results for input(s): AMMONIA in the last 168 hours. Coagulation Profile: No results for input(s): INR, PROTIME in the last 168 hours. Cardiac Enzymes: No results for input(s): CKTOTAL, CKMB, CKMBINDEX, TROPONINI in the last 168 hours. BNP (last 3 results) No results for input(s): PROBNP in the last 8760 hours. HbA1C: No results for input(s): HGBA1C in the last 72 hours. CBG: No results for input(s): GLUCAP in the last 168 hours. Lipid Profile: Recent Labs    01/18/21 0602  CHOL 82  HDL 31*  LDLCALC 43  TRIG 38  CHOLHDL 2.6   Thyroid Function Tests: No results for input(s): TSH, T4TOTAL, FREET4, T3FREE, THYROIDAB in the last 72  hours. Anemia Panel: No results for input(s): VITAMINB12, FOLATE, FERRITIN, TIBC, IRON, RETICCTPCT in the last 72 hours. Sepsis Labs: Recent Labs  Lab 01/17/21 1501 01/17/21 1629  PROCALCITON 0.11  --   LATICACIDVEN  --  1.5    Recent Results (from the past 240 hour(s))  Resp Panel by RT-PCR (Flu A&B, Covid) Nasopharyngeal Swab     Status: None   Collection Time: 01/12/21  9:10 AM   Specimen: Nasopharyngeal Swab; Nasopharyngeal(NP) swabs in vial transport medium  Result Value Ref Range Status   SARS Coronavirus 2 by RT PCR NEGATIVE NEGATIVE Final    Comment: (NOTE) SARS-CoV-2 target nucleic acids are NOT DETECTED.  The SARS-CoV-2 RNA is generally detectable in upper respiratory specimens during the acute phase of infection. The lowest concentration of SARS-CoV-2 viral copies this assay can detect is 138 copies/mL. A negative result does not preclude SARS-Cov-2 infection and should not be used as the sole basis for treatment or other patient management decisions. A negative result may occur with  improper specimen collection/handling, submission of specimen other than nasopharyngeal swab, presence of viral mutation(s) within the areas targeted by this assay,  and inadequate number of viral copies(<138 copies/mL). A negative result must be combined with clinical observations, patient history, and epidemiological information. The expected result is Negative.  Fact Sheet for Patients:  EntrepreneurPulse.com.au  Fact Sheet for Healthcare Providers:  IncredibleEmployment.be  This test is no t yet approved or cleared by the [place] FDA and  has been authorized for detection and/or diagnosis of SARS-CoV-2 by FDA under an Emergency Use Authorization (EUA). This EUA will remain  in effect (meaning this test can be used) for the duration of the COVID-19 declaration under Section 564(b)(1) of the Act, 21 U.S.C.section 360bbb-3(b)(1), unless the  authorization is terminated  or revoked sooner.       Influenza A by PCR NEGATIVE NEGATIVE Final   Influenza B by PCR NEGATIVE NEGATIVE Final    Comment: (NOTE) The Xpert Xpress SARS-CoV-2/FLU/RSV plus assay is intended as an aid in the diagnosis of influenza from Nasopharyngeal swab specimens and should not be used as a sole basis for treatment. Nasal washings and aspirates are unacceptable for Xpert Xpress SARS-CoV-2/FLU/RSV testing.  Fact Sheet for Patients: EntrepreneurPulse.com.au  Fact Sheet for Healthcare Providers: IncredibleEmployment.be  This test is not yet approved or cleared by the [place] FDA and has been authorized for detection and/or diagnosis of SARS-CoV-2 by FDA under an Emergency Use Authorization (EUA). This EUA will remain in effect (meaning this test can be used) for the duration of the COVID-19 declaration under Section 564(b)(1) of the Act, 21 U.S.C. section 360bbb-3(b)(1), unless the authorization is terminated or revoked.  Performed at [facility], [address] [place], [place] [address]   Blood Culture (routine x 2)     Status: None (Preliminary result)   Collection Time: 01/17/21  4:20 PM   Specimen: BLOOD  Result Value Ref Range Status   Specimen Description BLOOD [facility]  Final   Special Requests   Final    BOTTLES DRAWN AEROBIC AND ANAEROBIC Blood Culture adequate volume   Culture   Final    NO GROWTH < 24 HOURS Performed at [facility], [address], [place], [place] [address]    Report Status PENDING  Incomplete  Resp Panel by RT-PCR (Flu A&B, Covid)     Status: None   Collection Time: 01/17/21  4:29 PM   Specimen: Nasopharyngeal(NP) swabs in vial transport medium  Result Value Ref Range Status   SARS Coronavirus 2 by RT PCR NEGATIVE NEGATIVE Final    Comment: (NOTE) SARS-CoV-2 target nucleic acids are NOT DETECTED.  The SARS-CoV-2 RNA is generally detectable in upper  respiratory specimens during the acute phase of infection. The lowest concentration of SARS-CoV-2 viral copies this assay can detect is 138 copies/mL. A negative result does not preclude SARS-Cov-2 infection and should not be used as the sole basis for treatment or other patient management decisions. A negative result may occur with  improper specimen collection/handling, submission of specimen other than nasopharyngeal swab, presence of viral mutation(s) within the areas targeted by this assay, and inadequate number of viral copies(<138 copies/mL). A negative result must be combined with clinical observations, patient history, and epidemiological information. The expected result is Negative.  Fact Sheet for Patients:  EntrepreneurPulse.com.au  Fact Sheet for Healthcare Providers:  IncredibleEmployment.be  This test is no t yet approved or cleared by the [place] FDA and  has been authorized for detection and/or diagnosis of SARS-CoV-2 by FDA under an Emergency Use Authorization (EUA). This EUA will remain  in effect (meaning this test  can be used) for the duration of the COVID-19 declaration under Section 564(b)(1) of the Act, 21 U.S.C.section 360bbb-3(b)(1), unless the authorization is terminated  or revoked sooner.       Influenza A by PCR NEGATIVE NEGATIVE Final   Influenza B by PCR NEGATIVE NEGATIVE Final    Comment: (NOTE) The Xpert Xpress SARS-CoV-2/FLU/RSV plus assay is intended as an aid in the diagnosis of influenza from Nasopharyngeal swab specimens and should not be used as a sole basis for treatment. Nasal washings and aspirates are unacceptable for Xpert Xpress SARS-CoV-2/FLU/RSV testing.  Fact Sheet for Patients: EntrepreneurPulse.com.au  Fact Sheet for Healthcare Providers: IncredibleEmployment.be  This test is not yet approved or cleared by the [place] FDA and has been  authorized for detection and/or diagnosis of SARS-CoV-2 by FDA under an Emergency Use Authorization (EUA). This EUA will remain in effect (meaning this test can be used) for the duration of the COVID-19 declaration under Section 564(b)(1) of the Act, 21 U.S.C. section 360bbb-3(b)(1), unless the authorization is terminated or revoked.  Performed at [facility], [address], [place], [place] [address]   Blood Culture (routine x 2)     Status: None (Preliminary result)   Collection Time: 01/17/21  4:29 PM   Specimen: BLOOD  Result Value Ref Range Status   Specimen Description BLOOD RAC  Final   Special Requests BOTTLES DRAWN AEROBIC AND ANAEROBIC BCAV  Final   Culture   Final    NO GROWTH < 24 HOURS Performed at [facility], [address], [place], [place] [address]    Report Status PENDING  Incomplete         Radiology Studies: DG Chest 1 View  Result Date: 01/17/2021 CLINICAL DATA:  Weakness, cough, altered mental status EXAM: CHEST  1 VIEW COMPARISON:  01/12/2021 FINDINGS: Dual lead pacer noted. Aortic valve prosthesis. Mild enlargement of the cardiopericardial silhouette is observed with indistinctness of the pulmonary vasculature potentially reflecting pulmonary venous hypertension. No Kerley B lines or overt interstitial edema observed. Atherosclerotic calcification of the aortic arch. Previous blunting of the left lateral costophrenic angle is less readily apparent on today's exam although there may be some minimal blunting. The area is partially obscured by ECG leads. Reduced acromial humeral distance bilaterally, often seen in the setting of chronic rotator cuff tears. IMPRESSION: 1. Mild enlargement of the cardiopericardial silhouette with pulmonary venous hypertension but no overt edema. 2. Minimal blunting of the left lateral costophrenic angle although improved from 01/12/2021. Slightly represents a trace pleural effusion. 3.  Aortic Atherosclerosis  (ICD10-I70.0). 4. Probable bilateral chronic rotator cuff tears. Electronically Signed   By: Van Clines M.D.   On: 01/17/2021 16:50   CT Head Wo Contrast  Result Date: 01/17/2021 CLINICAL DATA:  Neuro deficit, acute, stroke suspected Mental status change, unknown cause EXAM: CT HEAD WITHOUT CONTRAST TECHNIQUE: Contiguous axial images were obtained from the base of the skull through the vertex without intravenous contrast. COMPARISON:  CT head 01/12/2021 BRAIN: BRAIN Cerebral ventricle sizes are concordant with the degree of cerebral volume loss. Patchy and confluent areas of decreased attenuation are noted throughout the deep and periventricular white matter of the cerebral hemispheres bilaterally, compatible with chronic microvascular ischemic disease. No evidence of large-territorial acute infarction. No parenchymal hemorrhage. No mass lesion. No extra-axial collection. No mass effect or midline shift. No hydrocephalus. Basilar cisterns are patent. Vascular: No hyperdense vessel. Atherosclerotic calcifications are present within the cavernous internal carotid and vertebral arteries. Skull: No acute  fracture or focal lesion. Sinuses/Orbits: Paranasal sinuses and mastoid air cells are clear. Bilateral lens replacement. Orbits are unremarkable. Other: None. IMPRESSION: No acute intracranial abnormality. Electronically Signed   By: Iven Finn M.D.   On: 01/17/2021 16:04   CT Angio Chest Pulmonary Embolism (PE) W or WO Contrast  Result Date: 01/17/2021 CLINICAL DATA:  Recent syncopal episode EXAM: CT ANGIOGRAPHY CHEST WITH CONTRAST TECHNIQUE: Multidetector CT imaging of the chest was performed using the standard protocol during bolus administration of intravenous contrast. Multiplanar CT image reconstructions and MIPs were obtained to evaluate the vascular anatomy. CONTRAST:  72mL OMNIPAQUE IOHEXOL 350 MG/ML SOLN COMPARISON:  Chest x-ray from earlier in the same day. FINDINGS: Cardiovascular:  Atherosclerotic calcifications of the thoracic aorta are noted without evidence of aneurysmal dilatation. Changes of prior TAVR are noted. The pulmonary artery shows a normal branching pattern without intraluminal filling defect to suggest pulmonary embolism. Coronary calcifications are noted. No pericardial effusion is seen. Mild cardiomegaly is noted. Pacing device is noted. Mediastinum/Nodes: Thoracic inlet is within normal limits. No sizable hilar or mediastinal adenopathy is noted. The esophagus shows evidence of a sliding-type hiatal hernia. No other focal esophageal abnormality is noted. Lungs/Pleura: Emphysematous changes of the lungs are noted bilaterally. Bilateral pleural effusions are noted right greater than left. Bibasilar atelectatic changes are seen as well. No sizable parenchymal nodule is noted. Upper Abdomen: Gallbladder has been surgically removed. The remainder of the visualized upper abdomen appears within normal limits. Musculoskeletal: Degenerative changes of the thoracic spine are noted. No acute rib abnormality is noted. Review of the MIP images confirms the above findings. IMPRESSION: No evidence of pulmonary emboli. Bilateral pleural effusions with bibasilar atelectatic changes. Aortic Atherosclerosis (ICD10-I70.0) and Emphysema (ICD10-J43.9). Electronically Signed   By: Inez Catalina M.D.   On: 01/17/2021 21:03        Scheduled Meds:  amiodarone  200 mg Oral BID   enoxaparin (LOVENOX) injection  40 mg Subcutaneous Q24H   ferrous sulfate  325 mg Oral BID WC   furosemide  40 mg Oral Daily   metoprolol tartrate  50 mg Oral BID   pantoprazole  40 mg Oral Daily   senna-docusate  1 tablet Oral Daily   tamsulosin  0.4 mg Oral QPC breakfast   Continuous Infusions:  sodium chloride 75 mL/hr at 01/17/21 2328     LOS: 1 day    Time spent: 25 minutes    Sidney Ace, MD [facility]   If 7PM-7AM, please contact night-coverage  01/18/2021, 10:06 AM

## 2021-01-18 NOTE — Evaluation (Signed)
Physical Therapy Evaluation Patient Details Name: JABRIL PURSELL MRN: [id number] DOB: 1923/06/05 Today's Date: 01/18/2021  History of Present Illness  presented to AMS, slurred speech and syncopal episode at [facility]; admitted for TIA/CVA work up.  CTH negative for acute insult; MRI deferred due to PPM.  Of note, patient with recent L hip fracture and repair (ORIF 01/12/21, WBAT)  Clinical Impression  Patient supine in bed upon arrival to room; alert and oriented to basic information, follows commands, pleasant and cooperative throughout.  Mild pain endorsed to L hip, slightly increased with movement and position change (FACES 4/1).  Tolerating increased ROM to L hip/knee this admission; however, remains generally guarded and limited by pain towards end-ranges.  Currently requiring mod assist for bed mobility; close sup for unsupported sitting.  Does require UE support to maintain balance; tolerates very minimal movement outside immediate BOS.  Standing/OOB deferred due to symptomatic orthostasis (see flowsheet for details); will continue to assess/progress in subsequent sessions as appropriate. Would benefit from skilled PT to address above deficits and promote optimal return to PLOF.; recommend transition to STR upon discharge from acute hospitalization.      Recommendations for follow up therapy are one component of a multi-disciplinary discharge planning process, led by the attending physician.  Recommendations may be updated based on patient status, additional functional criteria and insurance authorization.  Follow Up Recommendations Skilled nursing-short term rehab (<3 hours/day)    Assistance Recommended at Discharge Frequent or constant Supervision/Assistance  Functional Status Assessment Patient has had a recent decline in their functional status and demonstrates the ability to make significant improvements in function in a reasonable and predictable amount of time.  Equipment  Recommendations       Recommendations for Other Services       Precautions / Restrictions Precautions Precautions: Fall Restrictions Weight Bearing Restrictions: Yes LLE Weight Bearing: Weight bearing as tolerated      Mobility  Bed Mobility Overal bed mobility: Needs Assistance Bed Mobility: Supine to Sit;Sit to Supine     Supine to sit: Mod assist Sit to supine: Mod assist   General bed mobility comments: assist for LE management, truncal elevation    Transfers                   General transfer comment: deferred due to symptomatic orthostasis    Ambulation/Gait               General Gait Details: deferred due to symptomatic orthostasis  Stairs            Wheelchair Mobility    Modified Rankin (Stroke Patients Only)       Balance Overall balance assessment: Needs assistance Sitting-balance support: No upper extremity supported;Feet supported Sitting balance-Leahy Scale: Fair Sitting balance - Comments: close sup with bilat UE support; limited movement outside immediate BOS                                     Pertinent Vitals/Pain Pain Assessment: Faces Faces Pain Scale: Hurts little more Pain Location: L hip Pain Descriptors / Indicators: Aching;Grimacing;Guarding Pain Intervention(s): Limited activity within patient's tolerance;Monitored during session;Repositioned    Home Living Family/patient expects to be discharged to:: Skilled nursing facility                   Additional Comments: At baseline, lives home alone, active without assist device.  Since hospitalization approx 4  weeks prior, has been in STR.  Planned for transition to ALF, but fell day prior to planned discharge.    Prior Function Prior Level of Function : Independent/Modified Independent             Mobility Comments: Mod indep at true baseline; has been using RW for mobility since recent hospitalization ADLs Comments: Family assists  with meals (although pt able to boil eggs per dtr), set up meds. Pt still drives, is forgetful but does very well with set routines. Indep with bathing, dressing.     Hand Dominance        Extremity/Trunk Assessment   Upper Extremity Assessment Upper Extremity Assessment: Overall WFL for tasks assessed    Lower Extremity Assessment Lower Extremity Assessment:  (L hip/knee grossly 3-/5, tolerating increased ROM than on previous admission)       Communication   Communication: HOH  Cognition Arousal/Alertness: Awake/alert Behavior During Therapy: WFL for tasks assessed/performed Overall Cognitive Status: Within Functional Limits for tasks assessed                                 General Comments: Extremely HOH; prefers seeing mouth/lips to understand. Oriented ot self, place, situation; follows commands; pleasant and cooperative throughout        General Comments      Exercises Other Exercises Other Exercises: Supine LE therex, 1x10, act assist ROM: ankle pumps, quad sets, SAQs, heel slides, hip abduct/adduct.  Improved fluidity of movement, improved tolerance for ROM compared to previous admission. Other Exercises: Rolling in bed for peri-care, linen change after incontinent bladder, cga/min assist; heavy use of bedrails   Assessment/Plan    PT Assessment Patient needs continued PT services  PT Problem List Decreased strength;Decreased range of motion;Decreased activity tolerance;Decreased balance;Decreased mobility;Decreased knowledge of precautions;Decreased coordination;Decreased cognition;Decreased knowledge of use of DME;Decreased safety awareness;Decreased skin integrity;Pain       PT Treatment Interventions DME instruction;Gait training;Stair training;Functional mobility training;Therapeutic activities;Therapeutic exercise;Balance training;Patient/family education;Cognitive remediation    PT Goals (Current goals can be found in the Care Plan section)   Acute Rehab PT Goals Patient Stated Goal: to return to [facility] PT Goal Formulation: With patient/family Time For Goal Achievement: 02/01/21 Potential to Achieve Goals: Fair    Frequency 7X/week   Barriers to discharge        Co-evaluation               AM-PAC PT "6 Clicks" Mobility  Outcome Measure Help needed turning from your back to your side while in a flat bed without using bedrails?: A Little Help needed moving from lying on your back to sitting on the side of a flat bed without using bedrails?: A Lot Help needed moving to and from a bed to a chair (including a wheelchair)?: A Lot Help needed standing up from a chair using your arms (e.g., wheelchair or bedside chair)?: A Lot Help needed to walk in hospital room?: Total Help needed climbing 3-5 steps with a railing? : Total 6 Click Score: 11    End of Session   Activity Tolerance: Patient tolerated treatment well;Treatment limited secondary to medical complications (Comment) (symptomatic orthostasis) Patient left: in bed;with call bell/phone within reach;with bed alarm set;with family/visitor present Nurse Communication: Mobility status PT Visit Diagnosis: Muscle weakness (generalized) (M62.81);Difficulty in walking, not elsewhere classified (R26.2);Pain Pain - Right/Left: Left Pain - part of body: Hip    Time: 6378-5885 PT Time  Calculation (min) (ACUTE ONLY): 35 min   Charges:   PT Evaluation $PT Eval Moderate Complexity: 1 Mod PT Treatments $Therapeutic Exercise: 8-22 mins $Therapeutic Activity: 8-22 mins       Wilmer Santillo H. Owens Shark, PT, DPT, NCS 01/18/21, 10:20 AM [phone]

## 2021-01-18 NOTE — Evaluation (Signed)
Occupational Therapy Evaluation Patient Details Name: Thomas Matthews MRN: [id number] DOB: 1923-12-25 Today's Date: 01/18/2021   History of Present Illness presented to AMS, slurred speech and syncopal episode at [facility]; admitted for TIA/CVA work up.  CTH negative for acute insult; MRI deferred due to PPM.  Of note, patient with recent L hip fracture and repair (ORIF 01/12/21, WBAT)   Clinical Impression   Pt seen for OT evaluation this date in setting of acute hospitalization d/t TIA. Pt presents this date with decreased fxl activity tolerance. He was apparently MOD I before previous hospitalization, but has been in rehab in recent weeks requiring RW for fxl mobility and some assist for ADLs. Pt presents with c/o L LE pain as well as confusion this date. He requires increased time and MOD A to come to EOB sitting and demos F balance. He requires MIN A for seated g/h tasks EOB. MOD/MAX A for seated LB ADLs such as donning socks as well as cues to sequence throughout for familiar tasks such as oral care. Pt returned to bed end of session with all needs met and in reach. Standing deferred as pt with + orthostatics on PT assessment earlier this date. Will continue to follow acutely. Anticipate pt will require STR f/u OT services.      Recommendations for follow up therapy are one component of a multi-disciplinary discharge planning process, led by the attending physician.  Recommendations may be updated based on patient status, additional functional criteria and insurance authorization.   Follow Up Recommendations  Skilled nursing-short term rehab (<3 hours/day)    Assistance Recommended at Discharge Frequent or constant Supervision/Assistance  Functional Status Assessment  Patient has had a recent decline in their functional status and demonstrates the ability to make significant improvements in function in a reasonable and predictable amount of time.  Equipment Recommendations  Other (comment)  (defer to next level of care)    Recommendations for Other Services       Precautions / Restrictions Precautions Precautions: Fall Restrictions Weight Bearing Restrictions: Yes LLE Weight Bearing: Weight bearing as tolerated      Mobility Bed Mobility Overal bed mobility: Needs Assistance Bed Mobility: Supine to Sit;Sit to Supine     Supine to sit: Mod assist Sit to supine: Mod assist   General bed mobility comments: increased time    Transfers                   General transfer comment: deferred, orthostatic and symptomatic      Balance Overall balance assessment: Needs assistance Sitting-balance support: No upper extremity supported;Feet supported Sitting balance-Leahy Scale: Fair Sitting balance - Comments: close sup with bilat UE support; limited movement outside immediate BOS       Standing balance comment: deferred                           ADL either performed or assessed with clinical judgement   ADL                                         General ADL Comments: requires MOD A for sup<>sit and MIN A for seated g/h tasks EOB. MOD/MAX A for seated LB ADLs such as donning socks.     Vision Patient Visual Report: No change from baseline       Perception  Praxis      Pertinent Vitals/Pain Pain Assessment: Faces Faces Pain Scale: Hurts little more Pain Location: L hip Pain Descriptors / Indicators: Aching;Grimacing;Guarding Pain Intervention(s): Limited activity within patient's tolerance;Monitored during session;Repositioned     Hand Dominance     Extremity/Trunk Assessment Upper Extremity Assessment Upper Extremity Assessment: Generalized weakness   Lower Extremity Assessment Lower Extremity Assessment: Generalized weakness (limited on L side d/t pain)       Communication Communication Communication: HOH   Cognition Arousal/Alertness: Awake/alert Behavior During Therapy: WFL for tasks  assessed/performed Overall Cognitive Status: No family/caregiver present to determine baseline cognitive functioning                                 General Comments: requires increased processing time, oriented to self and "hospital"     General Comments       Exercises Other Exercises Other Exercises: OT engages pt in seated ADLs with cues and MIN A   Shoulder Instructions      Home Living Family/patient expects to be discharged to:: Skilled nursing facility                                 Additional Comments: At baseline, lives home alone, active without assist device.  Since hospitalization approx 4 weeks prior, has been in STR.  Planned for transition to ALF, but fell day prior to planned discharge.      Prior Functioning/Environment Prior Level of Function : Independent/Modified Independent             Mobility Comments: Mod indep at true baseline; has been using RW for mobility since recent hospitalization ADLs Comments: Family assists with meals (although pt able to boil eggs per dtr), set up meds. Pt still drives, is forgetful but does very well with set routines. Indep with bathing, dressing.        OT Problem List: Decreased strength;Decreased range of motion;Decreased activity tolerance;Impaired balance (sitting and/or standing);Decreased coordination;Decreased safety awareness;Decreased knowledge of use of DME or AE;Decreased knowledge of precautions      OT Treatment/Interventions: Self-care/ADL training;Therapeutic exercise;Energy conservation;DME and/or AE instruction;Therapeutic activities    OT Goals(Current goals can be found in the care plan section) Acute Rehab OT Goals Patient Stated Goal: for L LE to stop hurting OT Goal Formulation: With patient Time For Goal Achievement: 02/01/21 Potential to Achieve Goals: Good ADL Goals Pt Will Perform Lower Body Dressing: with min assist;sitting/lateral leans;with adaptive  equipment (with <10% cues) Pt Will Transfer to Toilet: with mod assist;ambulating;bedside commode Pt Will Perform Toileting - Clothing Manipulation and hygiene: with min assist;sitting/lateral leans  OT Frequency: Min 2X/week   Barriers to D/C:            Co-evaluation              AM-PAC OT "6 Clicks" Daily Activity     Outcome Measure Help from another person eating meals?: A Little Help from another person taking care of personal grooming?: A Little Help from another person toileting, which includes using toliet, bedpan, or urinal?: A Lot Help from another person bathing (including washing, rinsing, drying)?: A Lot Help from another person to put on and taking off regular upper body clothing?: A Little Help from another person to put on and taking off regular lower body clothing?: A Lot 6 Click Score: 15   End of  Session Nurse Communication: Mobility status  Activity Tolerance: Patient tolerated treatment well Patient left: in bed;with call bell/phone within reach;with bed alarm set  OT Visit Diagnosis: Unsteadiness on feet (R26.81);Muscle weakness (generalized) (M62.81);Other abnormalities of gait and mobility (R26.89)                Time: 6770-3403 OT Time Calculation (min): 12 min Charges:  OT General Charges $OT Visit: 1 Visit OT Evaluation $OT Eval Moderate Complexity: [place], [place], OTR/L ascom [phone] 01/18/21, 2:21 PM

## 2021-01-18 NOTE — Evaluation (Signed)
Speech Language Pathology Evaluation Patient Details Name: COLEMAN KALAS MRN: [id number] DOB: [birth year] Today's Date: 01/18/2021 Time: 0932-6712 SLP Time Calculation (min) (ACUTE ONLY): 20 min  Problem List:  Patient Active Problem List   Diagnosis Date Noted   Hypokalemia 45/80/9983   Acute metabolic encephalopathy 38/25/0539   H/O sick sinus syndrome 01/13/2021   BPH (benign prostatic hyperplasia) 01/13/2021   Femur fracture, left ([place]) 01/12/2021   Atrial fibrillation, chronic ([place]) 01/12/2021   Chronic diastolic CHF (congestive heart failure) ([place]) 01/12/2021   COPD (chronic obstructive pulmonary disease) ([place]) 01/12/2021   Protein-calorie malnutrition, severe 12/14/2020   Pressure injury of skin 12/13/2020   NSVT (nonsustained ventricular tachycardia) 12/11/2020   S/P TAVR (transcatheter aortic valve replacement) 12/11/2020   Acid reflux 07/27/2017   Acne erythematosa 07/27/2017   Flutter-fibrillation 07/27/2017   BP (high blood pressure) 07/27/2017   Cardiac pacemaker in situ 07/27/2017   CAD (coronary artery disease) 07/27/2017   DD (diverticular disease) 07/27/2017   History of GI bleed 04/16/2008   Benign neoplasm of colon 02/27/2004   Past Medical History: History reviewed. No pertinent past medical history. Past Surgical History:  Past Surgical History:  Procedure Laterality Date   INTRAMEDULLARY (IM) NAIL INTERTROCHANTERIC Left 01/12/2021   Procedure: INTRAMEDULLARY (IM) NAIL INTERTROCHANTRIC;  Surgeon: Corky Mull, MD;  Location: [facility] ORS;  Service: Orthopedics;  Laterality: Left;   HPI:  presented to AMS, slurred speech and syncopal episode at [facility]; admitted for TIA/CVA work up.  CTH negative for acute insult; MRI deferred due to PPM.  Of note, patient with recent L hip fracture and repair (ORIF 01/12/21, WBAT)   Assessment / Plan / Recommendation Clinical Impression  Per chart, pt's current cognitive abilities appear changed from previous admission. Pt  completed SLUMS examination this date scoring 19/30 indicating a positive screen for dementia. The SLUMS is a screening questionnaire that tests orientation, memory, attention, and executive function - a positive screen indicates need for further testing. Pt with noted impairments in short term and working memory, attention, problem solving, and orientation limiting ability to participate functionally in ADLs. Recommend STR with ST services to be provided in that venue of care.    SLP Assessment  SLP Recommendation/Assessment: All further Speech Lanaguage Pathology  needs can be addressed in the next venue of care SLP Visit Diagnosis: Cognitive communication deficit (R41.841)    Recommendations for follow up therapy are one component of a multi-disciplinary discharge planning process, led by the attending physician.  Recommendations may be updated based on patient status, additional functional criteria and insurance authorization.    Follow Up Recommendations  Skilled nursing-short term rehab (<3 hours/day)    Assistance Recommended at Discharge  Frequent or constant Supervision/Assistance  Functional Status Assessment Patient has had a recent decline in their functional status and demonstrates the ability to make significant improvements in function in a reasonable and predictable amount of time.  Frequency and Duration           SLP Evaluation Cognition  Overall Cognitive Status: No family/caregiver present to determine baseline cognitive functioning Arousal/Alertness: Awake/alert Orientation Level: Oriented to person Year: 2022 Month: November Day of Week: Correct       Comprehension  Auditory Comprehension Overall Auditory Comprehension: Appears within functional limits for tasks assessed    Expression Expression Primary Mode of Expression: Verbal Verbal Expression Overall Verbal Expression: Appears within functional limits for tasks assessed   Oral / Motor  Oral  Motor/Sensory Function Overall Oral Motor/Sensory Function: Within functional  limits Motor Speech Overall Motor Speech: Appears within functional limits for tasks assessed   GO                   Frankey Botting B. Rutherford Nail M.S., CCC-SLP, [place] Office [phone]  Stormy Fabian 01/18/2021, 4:07 PM

## 2021-01-19 LAB — BASIC METABOLIC PANEL
Anion gap: 7 (ref 5–15)
BUN: 18 mg/dL (ref 8–23)
CO2: 26 mmol/L (ref 22–32)
Calcium: 7.7 mg/dL — ABNORMAL LOW (ref 8.9–10.3)
Chloride: 100 mmol/L (ref 98–111)
Creatinine, Ser: 0.88 mg/dL (ref 0.61–1.24)
GFR, Estimated: >60 mL/min (ref >60)
Glucose, Bld: 108 mg/dL — ABNORMAL HIGH (ref 70–99)
Potassium: 2.9 mmol/L — ABNORMAL LOW (ref 3.5–5.1)
Sodium: 133 mmol/L — ABNORMAL LOW (ref 135–145)

## 2021-01-19 LAB — MAGNESIUM: Magnesium: 1.7 mg/dL (ref 1.7–2.4)

## 2021-01-19 MED ORDER — POTASSIUM CHLORIDE 10 MEQ/100ML IV SOLN
10.0000 meq | INTRAVENOUS | Status: AC
  Administered 2021-01-20 (×4): 10 meq via INTRAVENOUS
  Filled 2021-01-19: qty 100

## 2021-01-19 MED ORDER — METOPROLOL TARTRATE 25 MG PO TABS
25.0000 mg | ORAL_TABLET | Freq: Two times a day (BID) | ORAL | Status: DC
  Administered 2021-01-19 – 2021-01-20 (×3): 25 mg via ORAL
  Filled 2021-01-19 (×3): qty 1

## 2021-01-19 MED ORDER — FUROSEMIDE 40 MG PO TABS
40.0000 mg | ORAL_TABLET | Freq: Every day | ORAL | Status: DC
  Administered 2021-01-19 – 2021-01-20 (×2): 40 mg via ORAL
  Filled 2021-01-19 (×2): qty 1

## 2021-01-19 MED ORDER — POTASSIUM CHLORIDE 20 MEQ PO PACK
40.0000 meq | PACK | Freq: Once | ORAL | Status: AC
Start: 2021-01-20 — End: 2021-01-20
  Administered 2021-01-20: 40 meq via ORAL
  Filled 2021-01-19: qty 2

## 2021-01-19 MED ORDER — MIDODRINE HCL 5 MG PO TABS
2.5000 mg | ORAL_TABLET | Freq: Three times a day (TID) | ORAL | Status: DC
  Administered 2021-01-19 – 2021-01-20 (×2): 2.5 mg via ORAL
  Filled 2021-01-19 (×2): qty 1

## 2021-01-19 NOTE — TOC Initial Note (Signed)
Transition of Care [facility]) - Initial/Assessment Note    Patient Details  Name: Thomas Matthews MRN: [id number] Date of Birth: October 12, 1923  Transition of Care [facility] CM/SW Contact:    Harriet Masson, RN Phone [phone] 01/19/2021, 9:28 AM  Clinical Narrative:                 Pt was pending to [facility] for ALF however event occurred and pt remained at [facility]. Spoke with daughter Earnest Bailey concerning the request for pt to return to the facility. Spoke with Magda Paganini [facility]) who indicated pt cn return to the facility early Monday morning. Daughter okay with [facility] for transport to the faciilty  tomorrow.  Attending and Daughter Neoma Laming) aware of the delay. TOC will remains available to assist with transport tomorrow.  Expected Discharge Plan: Skilled Nursing Facility Barriers to Discharge: Continued Medical Work up   Patient Goals and CMS Choice        Expected Discharge Plan and Services Expected Discharge Plan: [place]                                              Prior Living Arrangements/Services     Patient language and need for interpreter reviewed:: Yes        Need for Family Participation in Patient Care: Yes (Comment) Care giver support system in place?: Yes (comment)   Criminal Activity/Legal Involvement Pertinent to Current Situation/Hospitalization: No - Comment as needed  Activities of Daily Living Home Assistive Devices/Equipment: Dentures (specify type), Eyeglasses, Walker (specify type) ADL Screening (condition at time of admission) Patient's cognitive ability adequate to safely complete daily activities?: Yes Is the patient deaf or have difficulty hearing?: Yes Does the patient have difficulty seeing, even when wearing glasses/contacts?: No Does the patient have difficulty concentrating, remembering, or making decisions?: Yes Patient able to express need for assistance with ADLs?: Yes Does the  patient have difficulty dressing or bathing?: Yes Independently performs ADLs?: No Communication: Independent Dressing (OT): Needs assistance Grooming: Needs assistance Feeding: Independent Bathing: Needs assistance Toileting: Needs assistance In/Out Bed: Needs assistance Walks in Home: Needs assistance Does the patient have difficulty walking or climbing stairs?: Yes Weakness of Legs: Left Weakness of Arms/Hands: None  Permission Sought/Granted   Permission granted to share information with : Yes, Verbal Permission Granted              Emotional Assessment       Orientation: : Oriented to Place, Oriented to Self Alcohol / Substance Use: Not Applicable Psych Involvement: No (comment)  Admission diagnosis:  TIA (transient ischemic attack) [G45.9] Hypotension, unspecified hypotension type [I95.9] Altered mental status, unspecified altered mental status type [P95.09] Acute metabolic encephalopathy [T26.71] Patient Active Problem List   Diagnosis Date Noted   Hypokalemia 24/58/0998   Acute metabolic encephalopathy 33/82/5053   H/O sick sinus syndrome 01/13/2021   BPH (benign prostatic hyperplasia) 01/13/2021   Femur fracture, left ([place]) 01/12/2021   Atrial fibrillation, chronic ([place]) 01/12/2021   Chronic diastolic CHF (congestive heart failure) ([place]) 01/12/2021   COPD (chronic obstructive pulmonary disease) ([place]) 01/12/2021   Protein-calorie malnutrition, severe 12/14/2020   Pressure injury of skin 12/13/2020   NSVT (nonsustained ventricular tachycardia) 12/11/2020   S/P TAVR (transcatheter aortic valve replacement) 12/11/2020   Acid reflux 07/27/2017   Acne erythematosa 07/27/2017   Flutter-fibrillation 07/27/2017   BP (high  blood pressure) 07/27/2017   Cardiac pacemaker in situ 07/27/2017   CAD (coronary artery disease) 07/27/2017   DD (diverticular disease) 07/27/2017   History of GI bleed 04/16/2008   Benign neoplasm of colon 02/27/2004   PCP:  Birdie Sons, MD Pharmacy:   [facility], [place] [address] [address] Phone: [phone] Fax: [place], [place] [address] [place] [address] Phone: [phone] Fax: [phone]     Social Determinants of Health (SDOH) Interventions    Readmission Risk Interventions No flowsheet data found.

## 2021-01-19 NOTE — Progress Notes (Signed)
Physical Therapy Treatment Patient Details Name: Thomas Matthews MRN: [id number] DOB: Jul 07, 1923 Today's Date: 01/19/2021   History of Present Illness presented to AMS, slurred speech and syncopal episode at [facility]; admitted for TIA/CVA work up.  CTH negative for acute insult; MRI deferred due to PPM.  Of note, patient with recent L hip fracture and repair (ORIF 01/12/21, WBAT)    PT Comments    Agrees with encouragement.  To EOB with mod a x 1. Steady in sitting.  He is able to stand for x 1 othostatic BP but unable to remain up for 3 minute mark.  125/73 supine  97/56  sitting 66/42 standing x 1 min  108/62 when back in supine RN aware.  Will secure chat MD to make him aware of continued orthostatic BP issues.   Recommendations for follow up therapy are one component of a multi-disciplinary discharge planning process, led by the attending physician.  Recommendations may be updated based on patient status, additional functional criteria and insurance authorization.  Follow Up Recommendations  Skilled nursing-short term rehab (<3 hours/day)     Assistance Recommended at Discharge    Equipment Recommendations       Recommendations for Other Services       Precautions / Restrictions Precautions Precautions: Fall Precaution Comments: orthostatic     Mobility  Bed Mobility Overal bed mobility: Needs Assistance Bed Mobility: Supine to Sit;Sit to Supine     Supine to sit: Mod assist Sit to supine: Max assist        Transfers Overall transfer level: Needs assistance Equipment used: Rolling walker (2 wheels) Transfers: Sit to/from Stand Sit to Stand: Min assist                Ambulation/Gait                   Stairs             Wheelchair Mobility    Modified Rankin (Stroke Patients Only)       Balance Overall balance assessment: Needs assistance Sitting-balance support: No upper extremity supported;Feet supported Sitting balance-Leahy  Scale: Fair     Standing balance support: Bilateral upper extremity supported Standing balance-Leahy Scale: Poor                              Cognition Arousal/Alertness: Awake/alert Behavior During Therapy: WFL for tasks assessed/performed Overall Cognitive Status: No family/caregiver present to determine baseline cognitive functioning                                          Exercises      General Comments        Pertinent Vitals/Pain Pain Assessment: No/denies pain    Home Living                          Prior Function            PT Goals (current goals can now be found in the care plan section) Progress towards PT goals: Not progressing toward goals - comment    Frequency    7X/week      PT Plan Current plan remains appropriate    Co-evaluation              AM-PAC PT "6 Clicks" Mobility  Outcome Measure  Help needed turning from your back to your side while in a flat bed without using bedrails?: A Little Help needed moving from lying on your back to sitting on the side of a flat bed without using bedrails?: A Lot Help needed moving to and from a bed to a chair (including a wheelchair)?: A Lot Help needed standing up from a chair using your arms (e.g., wheelchair or bedside chair)?: A Little Help needed to walk in hospital room?: A Lot Help needed climbing 3-5 steps with a railing? : Total 6 Click Score: 13    End of Session Equipment Utilized During Treatment: Gait belt Activity Tolerance: Patient tolerated treatment well;Treatment limited secondary to medical complications (Comment) Patient left: in bed;with call bell/phone within reach;with bed alarm set Nurse Communication: Mobility status;Other (comment) PT Visit Diagnosis: Muscle weakness (generalized) (M62.81);Difficulty in walking, not elsewhere classified (R26.2);Pain Pain - Right/Left: Left Pain - part of body: Hip     Time: 1696-7893 PT  Time Calculation (min) (ACUTE ONLY): 23 min  Charges:  $Therapeutic Activity: 23-37 mins                    Chesley Noon, PTA 01/19/21, 11:42 AM

## 2021-01-19 NOTE — Progress Notes (Signed)
PROGRESS NOTE    Thomas Matthews  NID:[id number] DOB: [birth year] DOA: 01/17/2021 PCP: Birdie Sons, MD    Brief Narrative:  87 y.o. male with medical history significant for CAD, diastolic CHF, EF 50 to 14%, chronic A. fib no longer on anticoagulation due to GI bleed requiring transfusions, s/p TAVR 2019 for severe aortic stenosis, HTN, recently hospitalized from 11/27-11/30 with a hip fracture undergoing ORIF on 11/27 with postoperative course complicated by delirium, who was sent in for evaluation of altered mental status, confusion and slurred speech x1 day with concern for CVA.  History is unreliable due to confusion but patient is denying cough or shortness of breath or chest pain, nausea or vomiting or diarrhea.  Low clinical suspicion for CVA.  Suspect postoperative delirium secondary to poor p.o. intake and possible narcotic use for pain control.   Assessment & Plan:   Principal Problem:   Acute metabolic encephalopathy Active Problems:   Cardiac pacemaker in situ   CAD (coronary artery disease)   History of GI bleed   S/P TAVR (transcatheter aortic valve replacement)   Atrial fibrillation, chronic (HCC)   Chronic diastolic CHF (congestive heart failure) (HCC)   COPD (chronic obstructive pulmonary disease) (HCC)   Hypokalemia  Acute metabolic encephalopathy Uncertain etiology, low suspicion for CVA PE ruled out No signs of infection Suspect hypotension related to dehydration and poor p.o. intake Possible contribution from pain medication at SNF CT head reassuring Mild bilateral carotid stenosis on [place] Plan: Continue telemetry Minimize narcotic use Therapy evaluations Skilled nursing facility disposition Stable for discharge at this time Discharge delayed by lack of staffing at [facility] We will plan on discharge 12/5    Hypokalemia Monitor and replace as necessary   S/p hip repair on 11/27 Therapy evaluations as able Pain control   Cardiac  pacemaker in situ Device check Outpatient follow-up     CAD (coronary artery disease) Minimal troponin elevation with no delta  No suspicion for ACS Telemetry monitoring for now DC IV fluids  Hypotension Likely underlying his syncopal event Blood pressure has improved Decrease home dose of metoprolol to 25 mg twice daily Restart home Lasix  History of GI bleed Hemoglobin baseline  Severe aortic stenosis status post TAVR Outpatient follow-up     Atrial fibrillation, chronic (HCC) Metoprolol restarted as hypotension is improved.  Dose decreased to 25 twice daily Continue home amiodarone Not a candidate for anticoagulation secondary to advanced age and history of GI bleed   Chronic diastolic congestive heart failure Cautiously restart home diuretics Metoprolol at reduced dose of 25 twice daily   DVT prophylaxis: SQ Lovenox Code Status: DNR Family Communication: Daughter Neoma Laming [phone] on 12/3 Disposition Plan: Status is: Inpatient  Remains inpatient appropriate because: Acute syncopal event.  Unclear etiology.  Unable to totally rule out stroke.  Clinical indications of vasovagal syncope and orthostatic hypotension.  Patient medically stable for discharge.  Discharge delayed by lack of staffing elevated comments.  Anticipate discharge 12/5       Level of care: Telemetry Medical  Consultants:  None  Procedures:  None  Antimicrobials: None   Subjective: Patient seen and examined.  Resting comfortably in bed.  Alert, oriented x3  Objective: Vitals:   01/18/21 2324 01/18/21 2324 01/19/21 0417 01/19/21 0731  BP: 129/69 129/69 137/72 133/72  Pulse: 63 71 73 81  Resp: 18 18 19 18   Temp: 97.8 F (36.6 C) 97.8 F (36.6 C) 97.9 F (36.6 C) 97.8 F (36.6 C)  TempSrc:  Oral Oral Oral Oral  SpO2: 96% 98% 97% 95%  Weight:      Height:        Intake/Output Summary (Last 24 hours) at 01/19/2021 1028 Last data filed at 01/19/2021 0949 Gross per 24 hour   Intake 600 ml  Output 1375 ml  Net -775 ml   Filed Weights   01/17/21 1500  Weight: 59 kg    Examination:  General exam: No acute distress.  Resting comfortably in bed Respiratory system: Lungs clear.  Normal work of breathing.  Room air Cardiovascular system: S1-S2, irregular rhythm, no murmurs, no pedal edema Gastrointestinal system: Soft, NT/ND, normal bowel sounds Central nervous system: Alert and oriented. No focal neurological deficits. Extremities: Symmetric 5 x 5 power. Skin: No rashes, lesions or ulcers Psychiatry: Judgement and insight appear normal. Mood & affect appropriate.     Data Reviewed: I have personally reviewed following labs and imaging studies  CBC: Recent Labs  Lab 01/13/21 0851 01/14/21 0153 01/15/21 0236 01/17/21 1501  WBC 10.9* 8.1 7.2 5.7  HGB 8.5* 7.8* 7.5* 8.4*  HCT 24.9* 22.4* 22.1* 25.6*  MCV 100.0 97.8 98.2 103.2*  PLT 108* 99* 98* 350*   Basic Metabolic Panel: Recent Labs  Lab 01/13/21 0851 01/14/21 0153 01/15/21 0236 01/17/21 1501 01/18/21 0602  NA 130* 129* 129* 134* 136  K 4.1 3.7 3.4* 3.0* 3.1*  CL 101 99 99 101 103  CO2 25 23 26 26 25   GLUCOSE 113* 104* 97 109* 89  BUN 18 22 21  24* 20  CREATININE 0.99 1.20 1.24 1.11 0.94  CALCIUM 7.4* 7.5* 7.4* 7.7* 7.3*   GFR: Estimated Creatinine Clearance: 39.2 mL/min (by C-G formula based on SCr of 0.94 mg/dL). Liver Function Tests: Recent Labs  Lab 01/14/21 0153  AST 16  ALT 5  ALKPHOS 46  BILITOT 0.6  PROT 5.0*  ALBUMIN 2.5*   No results for input(s): LIPASE, AMYLASE in the last 168 hours. No results for input(s): AMMONIA in the last 168 hours. Coagulation Profile: No results for input(s): INR, PROTIME in the last 168 hours. Cardiac Enzymes: No results for input(s): CKTOTAL, CKMB, CKMBINDEX, TROPONINI in the last 168 hours. BNP (last 3 results) No results for input(s): PROBNP in the last 8760 hours. HbA1C: No results for input(s): HGBA1C in the last 72  hours. CBG: No results for input(s): GLUCAP in the last 168 hours. Lipid Profile: Recent Labs    01/18/21 0602  CHOL 82  HDL 31*  LDLCALC 43  TRIG 38  CHOLHDL 2.6   Thyroid Function Tests: No results for input(s): TSH, T4TOTAL, FREET4, T3FREE, THYROIDAB in the last 72 hours. Anemia Panel: No results for input(s): VITAMINB12, FOLATE, FERRITIN, TIBC, IRON, RETICCTPCT in the last 72 hours. Sepsis Labs: Recent Labs  Lab 01/17/21 1501 01/17/21 1629  PROCALCITON 0.11  --   LATICACIDVEN  --  1.5    Recent Results (from the past 240 hour(s))  Resp Panel by RT-PCR (Flu A&B, Covid) Nasopharyngeal Swab     Status: None   Collection Time: 01/12/21  9:10 AM   Specimen: Nasopharyngeal Swab; Nasopharyngeal(NP) swabs in vial transport medium  Result Value Ref Range Status   SARS Coronavirus 2 by RT PCR NEGATIVE NEGATIVE Final    Comment: (NOTE) SARS-CoV-2 target nucleic acids are NOT DETECTED.  The SARS-CoV-2 RNA is generally detectable in upper respiratory specimens during the acute phase of infection. The lowest concentration of SARS-CoV-2 viral copies this assay can detect is 138 copies/mL. A negative result  does not preclude SARS-Cov-2 infection and should not be used as the sole basis for treatment or other patient management decisions. A negative result may occur with  improper specimen collection/handling, submission of specimen other than nasopharyngeal swab, presence of viral mutation(s) within the areas targeted by this assay, and inadequate number of viral copies(<138 copies/mL). A negative result must be combined with clinical observations, patient history, and epidemiological information. The expected result is Negative.  Fact Sheet for Patients:  EntrepreneurPulse.com.au  Fact Sheet for Healthcare Providers:  IncredibleEmployment.be  This test is no t yet approved or cleared by the [place] FDA and  has been authorized for  detection and/or diagnosis of SARS-CoV-2 by FDA under an Emergency Use Authorization (EUA). This EUA will remain  in effect (meaning this test can be used) for the duration of the COVID-19 declaration under Section 564(b)(1) of the Act, 21 U.S.C.section 360bbb-3(b)(1), unless the authorization is terminated  or revoked sooner.       Influenza A by PCR NEGATIVE NEGATIVE Final   Influenza B by PCR NEGATIVE NEGATIVE Final    Comment: (NOTE) The Xpert Xpress SARS-CoV-2/FLU/RSV plus assay is intended as an aid in the diagnosis of influenza from Nasopharyngeal swab specimens and should not be used as a sole basis for treatment. Nasal washings and aspirates are unacceptable for Xpert Xpress SARS-CoV-2/FLU/RSV testing.  Fact Sheet for Patients: EntrepreneurPulse.com.au  Fact Sheet for Healthcare Providers: IncredibleEmployment.be  This test is not yet approved or cleared by the [place] FDA and has been authorized for detection and/or diagnosis of SARS-CoV-2 by FDA under an Emergency Use Authorization (EUA). This EUA will remain in effect (meaning this test can be used) for the duration of the COVID-19 declaration under Section 564(b)(1) of the Act, 21 U.S.C. section 360bbb-3(b)(1), unless the authorization is terminated or revoked.  Performed at [facility], [address] [place], [place] [address]   Blood Culture (routine x 2)     Status: None (Preliminary result)   Collection Time: 01/17/21  4:20 PM   Specimen: BLOOD  Result Value Ref Range Status   Specimen Description BLOOD [facility]  Final   Special Requests   Final    BOTTLES DRAWN AEROBIC AND ANAEROBIC Blood Culture adequate volume   Culture   Final    NO GROWTH 2 DAYS Performed at [facility], [address], [place], [place] [address]    Report Status PENDING  Incomplete  Resp Panel by RT-PCR (Flu A&B, Covid)     Status: None   Collection Time: 01/17/21  4:29  PM   Specimen: Nasopharyngeal(NP) swabs in vial transport medium  Result Value Ref Range Status   SARS Coronavirus 2 by RT PCR NEGATIVE NEGATIVE Final    Comment: (NOTE) SARS-CoV-2 target nucleic acids are NOT DETECTED.  The SARS-CoV-2 RNA is generally detectable in upper respiratory specimens during the acute phase of infection. The lowest concentration of SARS-CoV-2 viral copies this assay can detect is 138 copies/mL. A negative result does not preclude SARS-Cov-2 infection and should not be used as the sole basis for treatment or other patient management decisions. A negative result may occur with  improper specimen collection/handling, submission of specimen other than nasopharyngeal swab, presence of viral mutation(s) within the areas targeted by this assay, and inadequate number of viral copies(<138 copies/mL). A negative result must be combined with clinical observations, patient history, and epidemiological information. The expected result is Negative.  Fact Sheet for Patients:  EntrepreneurPulse.com.au  Fact Sheet for  Healthcare Providers:  IncredibleEmployment.be  This test is no t yet approved or cleared by the [place] and  has been authorized for detection and/or diagnosis of SARS-CoV-2 by FDA under an Emergency Use Authorization (EUA). This EUA will remain  in effect (meaning this test can be used) for the duration of the COVID-19 declaration under Section 564(b)(1) of the Act, 21 U.S.C.section 360bbb-3(b)(1), unless the authorization is terminated  or revoked sooner.       Influenza A by PCR NEGATIVE NEGATIVE Final   Influenza B by PCR NEGATIVE NEGATIVE Final    Comment: (NOTE) The Xpert Xpress SARS-CoV-2/FLU/RSV plus assay is intended as an aid in the diagnosis of influenza from Nasopharyngeal swab specimens and should not be used as a sole basis for treatment. Nasal washings and aspirates are unacceptable for Xpert  Xpress SARS-CoV-2/FLU/RSV testing.  Fact Sheet for Patients: EntrepreneurPulse.com.au  Fact Sheet for Healthcare Providers: IncredibleEmployment.be  This test is not yet approved or cleared by the [place] FDA and has been authorized for detection and/or diagnosis of SARS-CoV-2 by FDA under an Emergency Use Authorization (EUA). This EUA will remain in effect (meaning this test can be used) for the duration of the COVID-19 declaration under Section 564(b)(1) of the Act, 21 U.S.C. section 360bbb-3(b)(1), unless the authorization is terminated or revoked.  Performed at [facility], [address], [place], [place] [address]   Blood Culture (routine x 2)     Status: None (Preliminary result)   Collection Time: 01/17/21  4:29 PM   Specimen: BLOOD  Result Value Ref Range Status   Specimen Description BLOOD RAC  Final   Special Requests BOTTLES DRAWN AEROBIC AND ANAEROBIC BCAV  Final   Culture   Final    NO GROWTH 2 DAYS Performed at [facility], [address], [place], [place] [address]    Report Status PENDING  Incomplete  MRSA Next Gen by PCR, Nasal     Status: None   Collection Time: 01/18/21 11:54 AM   Specimen: Nasal Mucosa; Nasal Swab  Result Value Ref Range Status   MRSA by PCR Next Gen NOT DETECTED NOT DETECTED Final    Comment: (NOTE) The GeneXpert MRSA Assay (FDA approved for NASAL specimens only), is one component of a comprehensive MRSA colonization surveillance program. It is not intended to diagnose MRSA infection nor to guide or monitor treatment for MRSA infections. Test performance is not FDA approved in patients less than 54 years old. Performed at [facility], [address], [place], [place] [address]          Radiology Studies: DG Chest 1 View  Result Date: 01/17/2021 CLINICAL DATA:  Weakness, cough, altered mental status EXAM: CHEST  1 VIEW COMPARISON:  01/12/2021 FINDINGS:  Dual lead pacer noted. Aortic valve prosthesis. Mild enlargement of the cardiopericardial silhouette is observed with indistinctness of the pulmonary vasculature potentially reflecting pulmonary venous hypertension. No Kerley B lines or overt interstitial edema observed. Atherosclerotic calcification of the aortic arch. Previous blunting of the left lateral costophrenic angle is less readily apparent on today's exam although there may be some minimal blunting. The area is partially obscured by ECG leads. Reduced acromial humeral distance bilaterally, often seen in the setting of chronic rotator cuff tears. IMPRESSION: 1. Mild enlargement of the cardiopericardial silhouette with pulmonary venous hypertension but no overt edema. 2. Minimal blunting of the left lateral costophrenic angle although improved from 01/12/2021. Slightly represents a trace pleural effusion. 3.  Aortic Atherosclerosis (  ICD10-I70.0). 4. Probable bilateral chronic rotator cuff tears. Electronically Signed   By: Van Clines M.D.   On: 01/17/2021 16:50   CT Head Wo Contrast  Result Date: 01/17/2021 CLINICAL DATA:  Neuro deficit, acute, stroke suspected Mental status change, unknown cause EXAM: CT HEAD WITHOUT CONTRAST TECHNIQUE: Contiguous axial images were obtained from the base of the skull through the vertex without intravenous contrast. COMPARISON:  CT head 01/12/2021 BRAIN: BRAIN Cerebral ventricle sizes are concordant with the degree of cerebral volume loss. Patchy and confluent areas of decreased attenuation are noted throughout the deep and periventricular white matter of the cerebral hemispheres bilaterally, compatible with chronic microvascular ischemic disease. No evidence of large-territorial acute infarction. No parenchymal hemorrhage. No mass lesion. No extra-axial collection. No mass effect or midline shift. No hydrocephalus. Basilar cisterns are patent. Vascular: No hyperdense vessel. Atherosclerotic calcifications are  present within the cavernous internal carotid and vertebral arteries. Skull: No acute fracture or focal lesion. Sinuses/Orbits: Paranasal sinuses and mastoid air cells are clear. Bilateral lens replacement. Orbits are unremarkable. Other: None. IMPRESSION: No acute intracranial abnormality. Electronically Signed   By: Iven Finn M.D.   On: 01/17/2021 16:04   CT Angio Chest Pulmonary Embolism (PE) W or WO Contrast  Result Date: 01/17/2021 CLINICAL DATA:  Recent syncopal episode EXAM: CT ANGIOGRAPHY CHEST WITH CONTRAST TECHNIQUE: Multidetector CT imaging of the chest was performed using the standard protocol during bolus administration of intravenous contrast. Multiplanar CT image reconstructions and MIPs were obtained to evaluate the vascular anatomy. CONTRAST:  9mL OMNIPAQUE IOHEXOL 350 MG/ML SOLN COMPARISON:  Chest x-ray from earlier in the same day. FINDINGS: Cardiovascular: Atherosclerotic calcifications of the thoracic aorta are noted without evidence of aneurysmal dilatation. Changes of prior TAVR are noted. The pulmonary artery shows a normal branching pattern without intraluminal filling defect to suggest pulmonary embolism. Coronary calcifications are noted. No pericardial effusion is seen. Mild cardiomegaly is noted. Pacing device is noted. Mediastinum/Nodes: Thoracic inlet is within normal limits. No sizable hilar or mediastinal adenopathy is noted. The esophagus shows evidence of a sliding-type hiatal hernia. No other focal esophageal abnormality is noted. Lungs/Pleura: Emphysematous changes of the lungs are noted bilaterally. Bilateral pleural effusions are noted right greater than left. Bibasilar atelectatic changes are seen as well. No sizable parenchymal nodule is noted. Upper Abdomen: Gallbladder has been surgically removed. The remainder of the visualized upper abdomen appears within normal limits. Musculoskeletal: Degenerative changes of the thoracic spine are noted. No acute rib  abnormality is noted. Review of the MIP images confirms the above findings. IMPRESSION: No evidence of pulmonary emboli. Bilateral pleural effusions with bibasilar atelectatic changes. Aortic Atherosclerosis (ICD10-I70.0) and Emphysema (ICD10-J43.9). Electronically Signed   By: Inez Catalina M.D.   On: 01/17/2021 21:03   US Carotid Bilateral (at [facility] and [facility] only)  Result Date: 01/19/2021 CLINICAL DATA:  Transient ischemic attack EXAM: BILATERAL CAROTID DUPLEX ULTRASOUND TECHNIQUE: Pearline Cables scale imaging, color Doppler and duplex ultrasound were performed of bilateral carotid and vertebral arteries in the neck. COMPARISON:  None. FINDINGS: Criteria: Quantification of carotid stenosis is based on velocity parameters that correlate the residual internal carotid diameter with NASCET-based stenosis levels, using the diameter of the distal internal carotid lumen as the denominator for stenosis measurement. The following velocity measurements were obtained: RIGHT ICA: 75/11 cm/sec CCA: 09/6 cm/sec SYSTOLIC ICA/CCA RATIO:  1.3 ECA:  134 cm/sec LEFT ICA: 82/19 cm/sec CCA: 28/3 cm/sec SYSTOLIC ICA/CCA RATIO:  1.2 ECA:  96 cm/sec RIGHT CAROTID ARTERY: Heterogeneous atherosclerotic plaque  in the proximal internal carotid artery. By peak systolic velocity criteria, the estimated stenosis is less than 50%. RIGHT VERTEBRAL ARTERY:  Patent with normal antegrade flow. LEFT CAROTID ARTERY: Heterogeneous and irregular atherosclerotic plaque in the proximal internal carotid artery. By peak systolic velocity criteria, the estimated stenosis is less than 50%. LEFT VERTEBRAL ARTERY:  Patent with normal antegrade flow. IMPRESSION: 1. Mild (1-49%) stenosis proximal right internal carotid artery secondary to heterogenous atherosclerotic plaque. 2. Mild (1-49%) stenosis proximal left internal carotid artery secondary to heterogeneous and irregular/ulcerated atherosclerotic plaque. 3. Vertebral arteries are patent with normal antegrade flow.  Signed, Criselda Peaches, MD, [facility] Electronically Signed   By: Jacqulynn Cadet M.D.   On: 01/19/2021 08:44        Scheduled Meds:  amiodarone  200 mg Oral BID   enoxaparin (LOVENOX) injection  40 mg Subcutaneous Q24H   ferrous sulfate  325 mg Oral BID WC   furosemide  40 mg Oral Daily   metoprolol tartrate  25 mg Oral BID   pantoprazole  40 mg Oral Daily   senna-docusate  1 tablet Oral Daily   tamsulosin  0.4 mg Oral QPC breakfast   Continuous Infusions:     LOS: 2 days    Time spent: 25 minutes    Sidney Ace, MD [facility]   If 7PM-7AM, please contact night-coverage  01/19/2021, 10:28 AM

## 2021-01-20 DIAGNOSIS — I517 Cardiomegaly: Secondary | ICD-10-CM | POA: Diagnosis not present

## 2021-01-20 DIAGNOSIS — I5042 Chronic combined systolic (congestive) and diastolic (congestive) heart failure: Secondary | ICD-10-CM | POA: Diagnosis not present

## 2021-01-20 DIAGNOSIS — D509 Iron deficiency anemia, unspecified: Secondary | ICD-10-CM | POA: Diagnosis present

## 2021-01-20 DIAGNOSIS — Z20822 Contact with and (suspected) exposure to covid-19: Secondary | ICD-10-CM | POA: Diagnosis present

## 2021-01-20 DIAGNOSIS — R0602 Shortness of breath: Secondary | ICD-10-CM | POA: Diagnosis not present

## 2021-01-20 DIAGNOSIS — I495 Sick sinus syndrome: Secondary | ICD-10-CM | POA: Diagnosis present

## 2021-01-20 DIAGNOSIS — J449 Chronic obstructive pulmonary disease, unspecified: Secondary | ICD-10-CM | POA: Diagnosis present

## 2021-01-20 DIAGNOSIS — R338 Other retention of urine: Secondary | ICD-10-CM | POA: Diagnosis present

## 2021-01-20 DIAGNOSIS — N189 Chronic kidney disease, unspecified: Secondary | ICD-10-CM | POA: Diagnosis not present

## 2021-01-20 DIAGNOSIS — R1313 Dysphagia, pharyngeal phase: Secondary | ICD-10-CM | POA: Diagnosis not present

## 2021-01-20 DIAGNOSIS — I2510 Atherosclerotic heart disease of native coronary artery without angina pectoris: Secondary | ICD-10-CM | POA: Diagnosis present

## 2021-01-20 DIAGNOSIS — Z7401 Bed confinement status: Secondary | ICD-10-CM | POA: Diagnosis not present

## 2021-01-20 DIAGNOSIS — R54 Age-related physical debility: Secondary | ICD-10-CM | POA: Diagnosis present

## 2021-01-20 DIAGNOSIS — N400 Enlarged prostate without lower urinary tract symptoms: Secondary | ICD-10-CM | POA: Diagnosis not present

## 2021-01-20 DIAGNOSIS — G9340 Encephalopathy, unspecified: Secondary | ICD-10-CM | POA: Diagnosis not present

## 2021-01-20 DIAGNOSIS — E785 Hyperlipidemia, unspecified: Secondary | ICD-10-CM | POA: Diagnosis present

## 2021-01-20 DIAGNOSIS — N179 Acute kidney failure, unspecified: Secondary | ICD-10-CM | POA: Diagnosis present

## 2021-01-20 DIAGNOSIS — E872 Acidosis, unspecified: Secondary | ICD-10-CM | POA: Diagnosis present

## 2021-01-20 DIAGNOSIS — R1030 Lower abdominal pain, unspecified: Secondary | ICD-10-CM | POA: Diagnosis not present

## 2021-01-20 DIAGNOSIS — N3281 Overactive bladder: Secondary | ICD-10-CM | POA: Diagnosis not present

## 2021-01-20 DIAGNOSIS — Z952 Presence of prosthetic heart valve: Secondary | ICD-10-CM | POA: Diagnosis not present

## 2021-01-20 DIAGNOSIS — K5900 Constipation, unspecified: Secondary | ICD-10-CM | POA: Diagnosis present

## 2021-01-20 DIAGNOSIS — S72142D Displaced intertrochanteric fracture of left femur, subsequent encounter for closed fracture with routine healing: Secondary | ICD-10-CM | POA: Diagnosis not present

## 2021-01-20 DIAGNOSIS — I350 Nonrheumatic aortic (valve) stenosis: Secondary | ICD-10-CM | POA: Diagnosis not present

## 2021-01-20 DIAGNOSIS — I130 Hypertensive heart and chronic kidney disease with heart failure and stage 1 through stage 4 chronic kidney disease, or unspecified chronic kidney disease: Secondary | ICD-10-CM | POA: Diagnosis present

## 2021-01-20 DIAGNOSIS — Z87891 Personal history of nicotine dependence: Secondary | ICD-10-CM | POA: Diagnosis not present

## 2021-01-20 DIAGNOSIS — F0390 Unspecified dementia without behavioral disturbance: Secondary | ICD-10-CM | POA: Diagnosis present

## 2021-01-20 DIAGNOSIS — I4729 Other ventricular tachycardia: Secondary | ICD-10-CM | POA: Diagnosis not present

## 2021-01-20 DIAGNOSIS — G9341 Metabolic encephalopathy: Secondary | ICD-10-CM | POA: Diagnosis not present

## 2021-01-20 DIAGNOSIS — D62 Acute posthemorrhagic anemia: Secondary | ICD-10-CM | POA: Diagnosis not present

## 2021-01-20 DIAGNOSIS — E876 Hypokalemia: Secondary | ICD-10-CM | POA: Diagnosis present

## 2021-01-20 DIAGNOSIS — I482 Chronic atrial fibrillation, unspecified: Secondary | ICD-10-CM | POA: Diagnosis present

## 2021-01-20 DIAGNOSIS — I110 Hypertensive heart disease with heart failure: Secondary | ICD-10-CM | POA: Diagnosis not present

## 2021-01-20 DIAGNOSIS — N401 Enlarged prostate with lower urinary tract symptoms: Secondary | ICD-10-CM | POA: Diagnosis present

## 2021-01-20 DIAGNOSIS — E871 Hypo-osmolality and hyponatremia: Secondary | ICD-10-CM | POA: Diagnosis present

## 2021-01-20 DIAGNOSIS — W19XXXD Unspecified fall, subsequent encounter: Secondary | ICD-10-CM | POA: Diagnosis not present

## 2021-01-20 DIAGNOSIS — M8580 Other specified disorders of bone density and structure, unspecified site: Secondary | ICD-10-CM | POA: Diagnosis not present

## 2021-01-20 DIAGNOSIS — G629 Polyneuropathy, unspecified: Secondary | ICD-10-CM | POA: Diagnosis not present

## 2021-01-20 DIAGNOSIS — Z9181 History of falling: Secondary | ICD-10-CM | POA: Diagnosis not present

## 2021-01-20 DIAGNOSIS — I480 Paroxysmal atrial fibrillation: Secondary | ICD-10-CM | POA: Diagnosis not present

## 2021-01-20 DIAGNOSIS — K219 Gastro-esophageal reflux disease without esophagitis: Secondary | ICD-10-CM | POA: Diagnosis not present

## 2021-01-20 DIAGNOSIS — I471 Supraventricular tachycardia: Secondary | ICD-10-CM | POA: Diagnosis present

## 2021-01-20 DIAGNOSIS — H3530 Unspecified macular degeneration: Secondary | ICD-10-CM | POA: Diagnosis not present

## 2021-01-20 DIAGNOSIS — N1831 Chronic kidney disease, stage 3a: Secondary | ICD-10-CM | POA: Diagnosis present

## 2021-01-20 DIAGNOSIS — I951 Orthostatic hypotension: Secondary | ICD-10-CM | POA: Diagnosis present

## 2021-01-20 DIAGNOSIS — R680 Hypothermia, not associated with low environmental temperature: Secondary | ICD-10-CM | POA: Diagnosis present

## 2021-01-20 DIAGNOSIS — I5032 Chronic diastolic (congestive) heart failure: Secondary | ICD-10-CM | POA: Diagnosis present

## 2021-01-20 DIAGNOSIS — Z950 Presence of cardiac pacemaker: Secondary | ICD-10-CM | POA: Diagnosis not present

## 2021-01-20 DIAGNOSIS — I959 Hypotension, unspecified: Secondary | ICD-10-CM | POA: Diagnosis present

## 2021-01-20 DIAGNOSIS — W1830XD Fall on same level, unspecified, subsequent encounter: Secondary | ICD-10-CM | POA: Diagnosis not present

## 2021-01-20 DIAGNOSIS — J90 Pleural effusion, not elsewhere classified: Secondary | ICD-10-CM | POA: Diagnosis not present

## 2021-01-20 DIAGNOSIS — E43 Unspecified severe protein-calorie malnutrition: Secondary | ICD-10-CM | POA: Diagnosis not present

## 2021-01-20 DIAGNOSIS — R9431 Abnormal electrocardiogram [ECG] [EKG]: Secondary | ICD-10-CM | POA: Diagnosis not present

## 2021-01-20 LAB — GLUCOSE, CAPILLARY
Glucose-Capillary: 95 mg/dL (ref 70–99)
Glucose-Capillary: 138 mg/dL — ABNORMAL HIGH (ref 70–99)

## 2021-01-20 LAB — RESP PANEL BY RT-PCR (FLU A&B, COVID) ARPGX2
Influenza A by PCR: NEGATIVE
Influenza B by PCR: NEGATIVE
SARS Coronavirus 2 by RT PCR: NEGATIVE

## 2021-01-20 LAB — HEMOGLOBIN A1C
Hgb A1c MFr Bld: 5.1 % (ref 4.8–5.6)
Mean Plasma Glucose: 100 mg/dL

## 2021-01-20 MED ORDER — HYDROCODONE-ACETAMINOPHEN 5-325 MG PO TABS: 1.0000 | ORAL_TABLET | Freq: Four times a day (QID) | ORAL | 0 refills | Status: DC | PRN

## 2021-01-20 MED ORDER — METOPROLOL TARTRATE 25 MG PO TABS: 25.0000 mg | ORAL_TABLET | Freq: Two times a day (BID) | ORAL | Status: DC

## 2021-01-20 MED ORDER — MIDODRINE HCL 2.5 MG PO TABS: 2.5000 mg | ORAL_TABLET | Freq: Three times a day (TID) | ORAL | Status: DC

## 2021-01-20 NOTE — Progress Notes (Signed)
Physical Therapy Treatment Patient Details Name: Thomas Matthews MRN: [id number] DOB: 1923-03-11 Today's Date: 01/20/2021   History of Present Illness presented to [facility], slurred speech and syncopal episode at [facility]; admitted for TIA/CVA work up.  CTH negative for acute insult; MRI deferred due to PPM.  Of note, patient with recent L hip fracture and repair (ORIF 01/12/21, WBAT)    PT Comments    Supine 114/92 P 77 Sit 114/72 P 85 Stand 111/56 P 88 Stand 107/59 P 79  Mod assist for bed mobility in and out of bed.  He is able to stand to complete orthostatic BP's today and take 2 sidesteps up in bed to reposition before sitting.  LLE buckling with each step.  No c/o dizziness today with improved activity tolerance.  Discussed with MD.   Recommendations for follow up therapy are one component of a multi-disciplinary discharge planning process, led by the attending physician.  Recommendations may be updated based on patient status, additional functional criteria and insurance authorization.  Follow Up Recommendations  Skilled nursing-short term rehab (<3 hours/day)     Assistance Recommended at Discharge    Equipment Recommendations  Other (comment)    Recommendations for Other Services       Precautions / Restrictions Precautions Precautions: Fall Precaution Comments: orthostatic Restrictions Weight Bearing Restrictions: Yes LLE Weight Bearing: Weight bearing as tolerated     Mobility  Bed Mobility Overal bed mobility: Needs Assistance Bed Mobility: Supine to Sit;Sit to Supine     Supine to sit: Mod assist Sit to supine: Mod assist        Transfers Overall transfer level: Needs assistance Equipment used: Rolling walker (2 wheels) Transfers: Sit to/from Stand Sit to Stand: Min assist                Ambulation/Gait Ambulation/Gait assistance: Mod assist Gait Distance (Feet): 2 Feet Assistive device: Rolling walker (2 wheels) Gait Pattern/deviations:  Step-to pattern Gait velocity: decreased     General Gait Details: lateral steps to reposition before returning to supine.  LLE buckling with each step   Stairs             Wheelchair Mobility    Modified Rankin (Stroke Patients Only)       Balance Overall balance assessment: Needs assistance Sitting-balance support: No upper extremity supported;Feet supported Sitting balance-Leahy Scale: Fair     Standing balance support: Bilateral upper extremity supported Standing balance-Leahy Scale: Poor                              Cognition Arousal/Alertness: Awake/alert Behavior During Therapy: WFL for tasks assessed/performed Overall Cognitive Status: No family/caregiver present to determine baseline cognitive functioning                                          Exercises      General Comments        Pertinent Vitals/Pain Pain Assessment: Faces Faces Pain Scale: Hurts little more Pain Location: L hip Pain Descriptors / Indicators: Aching;Grimacing;Guarding    Home Living                          Prior Function            PT Goals (current goals can now be found in the care  plan section) Progress towards PT goals: Progressing toward goals    Frequency    7X/week      PT Plan Current plan remains appropriate    Co-evaluation              AM-PAC PT "6 Clicks" Mobility   Outcome Measure  Help needed turning from your back to your side while in a flat bed without using bedrails?: A Little Help needed moving from lying on your back to sitting on the side of a flat bed without using bedrails?: A Lot Help needed moving to and from a bed to a chair (including a wheelchair)?: A Lot Help needed standing up from a chair using your arms (e.g., wheelchair or bedside chair)?: A Little Help needed to walk in hospital room?: A Lot Help needed climbing 3-5 steps with a railing? : Total 6 Click Score: 13    End of  Session Equipment Utilized During Treatment: Gait belt Activity Tolerance: Patient tolerated treatment well Patient left: in bed;with call bell/phone within reach;with bed alarm set Nurse Communication: Mobility status;Other (comment) PT Visit Diagnosis: Muscle weakness (generalized) (M62.81);Difficulty in walking, not elsewhere classified (R26.2);Pain Pain - Right/Left: Left Pain - part of body: Hip     Time: 6578-4696 PT Time Calculation (min) (ACUTE ONLY): 12 min  Charges:  $Therapeutic Activity: 8-22 mins                    Chesley Noon, PTA 01/20/21, 9:50 AM

## 2021-01-20 NOTE — Progress Notes (Signed)
Pt d/c back to facility via EMS. All belongings sent with pt. IV removed intact. VSS. Report called to facility.

## 2021-01-20 NOTE — Care Management Important Message (Signed)
Important Message  Patient Details  Name: Thomas Matthews MRN: [id number] Date of Birth: 07-29-1923   Medicare Important Message Given:  Yes  I reviewed the Important Message from Medicare with the patient's daughter, Earnest Bailey ([phone]). She is in agreement with the discharge plan. I thanked her for her time.  Juliann Pulse A Irving Bloor 01/20/2021, 11:12 AM

## 2021-01-20 NOTE — Discharge Summary (Signed)
Physician Discharge Summary  Thomas Matthews OVF:[id number] DOB: [birth year] DOA: 01/17/2021  PCP: Birdie Sons, MD  Admit date: 01/17/2021 Discharge date: 01/20/2021  Admitted From: SNF Disposition: SNF, [facility]  Recommendations for Outpatient Follow-up:  Follow up with PCP in 1-2 weeks   Home Health: No Equipment/Devices: None  Discharge Condition: Stable CODE STATUS: DNR Diet recommendation: Regular  Brief/Interim Summary: 87 y.o. male with medical history significant for CAD, diastolic CHF, EF 50 to 84%, chronic A. fib no longer on anticoagulation due to GI bleed requiring transfusions, s/p TAVR 2019 for severe aortic stenosis, HTN, recently hospitalized from 11/27-11/30 with a hip fracture undergoing ORIF on 11/27 with postoperative course complicated by delirium, who was sent in for evaluation of altered mental status, confusion and slurred speech x1 day with concern for CVA.  History is unreliable due to confusion but patient is denying cough or shortness of breath or chest pain, nausea or vomiting or diarrhea.   Low clinical suspicion for CVA.  Suspect postoperative delirium secondary to poor p.o. intake and possible narcotic use for pain control.  Patient was seen by physical therapy and house.  Noted to have marked orthostasis.  On review of home medications patient is on metoprolol for atrial fibrillation and blood pressure control.  Reduce dose of metoprolol.  Add midodrine 2.5 3 times daily.  Orthostatics improved at time of discharge on reevaluation by physical therapy.  Stable for discharge.  Will return to [facility] facility.    Discharge Diagnoses:  Principal Problem:   Acute metabolic encephalopathy Active Problems:   Cardiac pacemaker in situ   CAD (coronary artery disease)   History of GI bleed   S/P TAVR (transcatheter aortic valve replacement)   Atrial fibrillation, chronic (HCC)   Chronic diastolic CHF (congestive heart  failure) (HCC)   COPD (chronic obstructive pulmonary disease) (HCC)   Hypokalemia  Acute metabolic encephalopathy Uncertain etiology, low suspicion for CVA PE ruled out No signs of infection Suspect hypotension related to dehydration and poor p.o. intake Possible contribution from pain medication at SNF CT head reassuring Mild bilateral carotid stenosis on [place] Plan: Continue telemetry discharge back to skilled nursing facility.  Avoid hypotension.  Suspect vasovagal effect.  Decrease dose of metoprolol.  Add midodrine 2.5 mg 3 times daily.  Reassess orthostatics and blood pressure while at facility. Minimize narcotic use     Hypokalemia Monitor and replace as necessary   S/p hip repair on 11/27 Continue therapy evaluations and pain control   Cardiac pacemaker in situ Device check Outpatient follow-up     CAD (coronary artery disease) Minimal troponin elevation with no delta  No suspicion for ACS    Hypotension Likely underlying his syncopal event Blood pressure has improved Decrease home dose of metoprolol to 25 mg twice daily Restart home Lasix Add midodrine 2.5 3 times daily   History of GI bleed Hemoglobin baseline   Severe aortic stenosis status post TAVR Outpatient follow-up     Atrial fibrillation, chronic (HCC) Metoprolol restarted as hypotension is improved.  Dose decreased to 25 twice daily Continue home amiodarone.  Midodrine added Not a candidate for anticoagulation secondary to advanced age and history of GI bleed   Chronic diastolic congestive heart failure Cautiously restart home diuretics Metoprolol at reduced dose of 25 twice daily  Discharge Instructions  Discharge Instructions     Diet - low sodium heart healthy   Complete by: As directed    Increase activity slowly   Complete  by: As directed    No wound care   Complete by: As directed       Allergies as of 01/20/2021       Reactions   Amlodipine Besylate Swelling   Felodipine  Swelling   Oxybutynin Chloride Other (See Comments)   Other reaction(s): Dizziness Tolerates taking at night   Simvastatin    Other reaction(s): Dizziness   Terazosin    Other reaction(s): Low blood pressure        Medication List     TAKE these medications    amiodarone 200 MG tablet Commonly known as: PACERONE Take 1 tablet (200 mg total) by mouth 2 (two) times daily.   calcium citrate-vitamin D 315-200 MG-UNIT tablet Commonly known as: CITRACAL+D Take 2 tablets by mouth daily.   cholecalciferol 25 MCG (1000 UNIT) tablet Commonly known as: VITAMIN D3 Take 2,000 Units by mouth daily.   enoxaparin 30 MG/0.3ML injection Commonly known as: LOVENOX Inject 0.3 mLs (30 mg total) into the skin daily.   ferrous sulfate 325 (65 FE) MG tablet Take 1 tablet (325 mg total) by mouth 2 (two) times daily with a meal.   furosemide 40 MG tablet Commonly known as: LASIX Take 1 tablet (40 mg total) by mouth daily.   HYDROcodone-acetaminophen 5-325 MG tablet Commonly known as: NORCO/VICODIN Take 1 tablet by mouth every 6 (six) hours as needed for up to 2 days for moderate pain. SNF use only What changed: additional instructions   metoprolol tartrate 25 MG tablet Commonly known as: LOPRESSOR Take 1 tablet (25 mg total) by mouth 2 (two) times daily. What changed: how much to take   midodrine 2.5 MG tablet Commonly known as: PROAMATINE Take 1 tablet (2.5 mg total) by mouth 3 (three) times daily with meals.   pantoprazole 40 MG tablet Commonly known as: PROTONIX Take 40 mg by mouth 2 (two) times daily. What changed: Another medication with the same name was removed. Continue taking this medication, and follow the directions you see here.   pravastatin 20 MG tablet Commonly known as: PRAVACHOL Take 10 mg by mouth daily.   senna-docusate 8.6-50 MG tablet Commonly known as: Senokot-S Take 1 tablet by mouth daily.   tamsulosin 0.4 MG Caps capsule Commonly known as:  FLOMAX Take 0.4 mg by mouth daily after breakfast.   tolterodine 4 MG 24 hr capsule Commonly known as: DETROL LA Take 4 mg by mouth daily.        Allergies  Allergen Reactions   Amlodipine Besylate Swelling   Felodipine Swelling   Oxybutynin Chloride Other (See Comments)    Other reaction(s): Dizziness Tolerates taking at night   Simvastatin     Other reaction(s): Dizziness   Terazosin     Other reaction(s): Low blood pressure    Consultations: None   Procedures/Studies: DG Chest 1 View  Result Date: 01/17/2021 CLINICAL DATA:  Weakness, cough, altered mental status EXAM: CHEST  1 VIEW COMPARISON:  01/12/2021 FINDINGS: Dual lead pacer noted. Aortic valve prosthesis. Mild enlargement of the cardiopericardial silhouette is observed with indistinctness of the pulmonary vasculature potentially reflecting pulmonary venous hypertension. No Kerley B lines or overt interstitial edema observed. Atherosclerotic calcification of the aortic arch. Previous blunting of the left lateral costophrenic angle is less readily apparent on today's exam although there may be some minimal blunting. The area is partially obscured by ECG leads. Reduced acromial humeral distance bilaterally, often seen in the setting of chronic rotator cuff tears. IMPRESSION: 1. Mild enlargement of  the cardiopericardial silhouette with pulmonary venous hypertension but no overt edema. 2. Minimal blunting of the left lateral costophrenic angle although improved from 01/12/2021. Slightly represents a trace pleural effusion. 3.  Aortic Atherosclerosis (ICD10-I70.0). 4. Probable bilateral chronic rotator cuff tears. Electronically Signed   By: Van Clines M.D.   On: 01/17/2021 16:50   DG Pelvis 1-2 Views  Result Date: 01/12/2021 CLINICAL DATA:  Fall with hip pain EXAM: PELVIS - 1-2 VIEW COMPARISON:  None. FINDINGS: Acute intertrochanteric left femur fracture with mild varus angulation. No evidence of pelvic ring fracture  or diastasis. Generalized osteopenia and atheromatous calcification. Gas over the right groin suggesting hernia. IMPRESSION: 1. Acute intertrochanteric left femur fracture. 2. Generalized osteopenia. 3. Gas over the right groin suggesting bowel containing hernia. Electronically Signed   By: Jorje Guild M.D.   On: 01/12/2021 09:00   CT Head Wo Contrast  Result Date: 01/17/2021 CLINICAL DATA:  Neuro deficit, acute, stroke suspected Mental status change, unknown cause EXAM: CT HEAD WITHOUT CONTRAST TECHNIQUE: Contiguous axial images were obtained from the base of the skull through the vertex without intravenous contrast. COMPARISON:  CT head 01/12/2021 BRAIN: BRAIN Cerebral ventricle sizes are concordant with the degree of cerebral volume loss. Patchy and confluent areas of decreased attenuation are noted throughout the deep and periventricular white matter of the cerebral hemispheres bilaterally, compatible with chronic microvascular ischemic disease. No evidence of large-territorial acute infarction. No parenchymal hemorrhage. No mass lesion. No extra-axial collection. No mass effect or midline shift. No hydrocephalus. Basilar cisterns are patent. Vascular: No hyperdense vessel. Atherosclerotic calcifications are present within the cavernous internal carotid and vertebral arteries. Skull: No acute fracture or focal lesion. Sinuses/Orbits: Paranasal sinuses and mastoid air cells are clear. Bilateral lens replacement. Orbits are unremarkable. Other: None. IMPRESSION: No acute intracranial abnormality. Electronically Signed   By: Iven Finn M.D.   On: 01/17/2021 16:04   CT Head Wo Contrast  Result Date: 01/12/2021 CLINICAL DATA:  Trauma, fall EXAM: CT HEAD WITHOUT CONTRAST TECHNIQUE: Contiguous axial images were obtained from the base of the skull through the vertex without intravenous contrast. COMPARISON:  12/11/2020 FINDINGS: Brain: No acute intracranial findings are seen. Cortical sulci are  prominent. There is decreased density in subcortical and periventricular white matter. Vascular: Scattered arterial calcifications are seen. Skull: Unremarkable. Sinuses/Orbits: There is mucosal thickening in the ethmoid and left maxillary sinuses. Other: No significant interval changes are noted. IMPRESSION: No acute intracranial findings are seen. Atrophy. Small-vessel disease. Mild chronic sinusitis. Electronically Signed   By: Elmer Picker M.D.   On: 01/12/2021 09:51   CT Angio Chest Pulmonary Embolism (PE) W or WO Contrast  Result Date: 01/17/2021 CLINICAL DATA:  Recent syncopal episode EXAM: CT ANGIOGRAPHY CHEST WITH CONTRAST TECHNIQUE: Multidetector CT imaging of the chest was performed using the standard protocol during bolus administration of intravenous contrast. Multiplanar CT image reconstructions and MIPs were obtained to evaluate the vascular anatomy. CONTRAST:  62mL OMNIPAQUE IOHEXOL 350 MG/ML SOLN COMPARISON:  Chest x-ray from earlier in the same day. FINDINGS: Cardiovascular: Atherosclerotic calcifications of the thoracic aorta are noted without evidence of aneurysmal dilatation. Changes of prior TAVR are noted. The pulmonary artery shows a normal branching pattern without intraluminal filling defect to suggest pulmonary embolism. Coronary calcifications are noted. No pericardial effusion is seen. Mild cardiomegaly is noted. Pacing device is noted. Mediastinum/Nodes: Thoracic inlet is within normal limits. No sizable hilar or mediastinal adenopathy is noted. The esophagus shows evidence of a sliding-type hiatal hernia. No other  focal esophageal abnormality is noted. Lungs/Pleura: Emphysematous changes of the lungs are noted bilaterally. Bilateral pleural effusions are noted right greater than left. Bibasilar atelectatic changes are seen as well. No sizable parenchymal nodule is noted. Upper Abdomen: Gallbladder has been surgically removed. The remainder of the visualized upper abdomen  appears within normal limits. Musculoskeletal: Degenerative changes of the thoracic spine are noted. No acute rib abnormality is noted. Review of the MIP images confirms the above findings. IMPRESSION: No evidence of pulmonary emboli. Bilateral pleural effusions with bibasilar atelectatic changes. Aortic Atherosclerosis (ICD10-I70.0) and Emphysema (ICD10-J43.9). Electronically Signed   By: Inez Catalina M.D.   On: 01/17/2021 21:03   CT Cervical Spine Wo Contrast  Result Date: 01/12/2021 CLINICAL DATA:  Trauma, fall EXAM: CT CERVICAL SPINE WITHOUT CONTRAST TECHNIQUE: Multidetector CT imaging of the cervical spine was performed without intravenous contrast. Multiplanar CT image reconstructions were also generated. COMPARISON:  12/11/2020 FINDINGS: Alignment: Alignment of posterior margins of vertebral bodies is essentially unremarkable. There is minimal anterolisthesis at C6-C7 level which has not changed significantly. Skull base and vertebrae: No recent fracture is seen. There are few small smooth marginated calcifications in the soft tissues posterior to the spinous processes of lower cervical and upper thoracic spine, possibly ligament calcification from previous injury. Degenerative changes are noted with disc space narrowing, bony spurs and facet hypertrophy at multiple levels. Soft tissues and spinal canal: There is extrinsic pressure over the ventral margin of thecal sac caused by posterior bony spurs at multiple levels, more so at C6-C7 level with mild-to-moderate spinal stenosis. Disc levels: There is encroachment of neural foramina by uncovertebral spurs from C3-C7 levels. Upper chest: Blebs and bullae are seen in the upper lung fields, more so on the right side. Other: No significant interval changes are noted. IMPRESSION: No recent fracture is seen in the cervical spine. Cervical spondylosis with spinal stenosis and encroachment of neural foramina at multiple levels. Overall, no significant interval  changes are noted since 12/11/2020. COPD. Electronically Signed   By: Elmer Picker M.D.   On: 01/12/2021 09:59   US Carotid Bilateral (at [facility] and [facility] only)  Result Date: 01/19/2021 CLINICAL DATA:  Transient ischemic attack EXAM: BILATERAL CAROTID DUPLEX ULTRASOUND TECHNIQUE: Pearline Cables scale imaging, color Doppler and duplex ultrasound were performed of bilateral carotid and vertebral arteries in the neck. COMPARISON:  None. FINDINGS: Criteria: Quantification of carotid stenosis is based on velocity parameters that correlate the residual internal carotid diameter with NASCET-based stenosis levels, using the diameter of the distal internal carotid lumen as the denominator for stenosis measurement. The following velocity measurements were obtained: RIGHT ICA: 75/11 cm/sec CCA: 91/6 cm/sec SYSTOLIC ICA/CCA RATIO:  1.3 ECA:  134 cm/sec LEFT ICA: 82/19 cm/sec CCA: 94/5 cm/sec SYSTOLIC ICA/CCA RATIO:  1.2 ECA:  96 cm/sec RIGHT CAROTID ARTERY: Heterogeneous atherosclerotic plaque in the proximal internal carotid artery. By peak systolic velocity criteria, the estimated stenosis is less than 50%. RIGHT VERTEBRAL ARTERY:  Patent with normal antegrade flow. LEFT CAROTID ARTERY: Heterogeneous and irregular atherosclerotic plaque in the proximal internal carotid artery. By peak systolic velocity criteria, the estimated stenosis is less than 50%. LEFT VERTEBRAL ARTERY:  Patent with normal antegrade flow. IMPRESSION: 1. Mild (1-49%) stenosis proximal right internal carotid artery secondary to heterogenous atherosclerotic plaque. 2. Mild (1-49%) stenosis proximal left internal carotid artery secondary to heterogeneous and irregular/ulcerated atherosclerotic plaque. 3. Vertebral arteries are patent with normal antegrade flow. Signed, Criselda Peaches, MD, [facility] Electronically  Signed   By: Jacqulynn Cadet M.D.   On: 01/19/2021 08:44   DG Chest Port 1  View  Result Date: 01/12/2021 CLINICAL DATA:  Hip pain EXAM: PORTABLE CHEST 1 VIEW COMPARISON:  12/11/2020 FINDINGS: Chronic cardiomegaly with dual-chamber pacer. Transcatheter aortic valve replacement. Trace left pleural effusion. Streaky density bilaterally which is vague and favors atelectasis or scarring. IMPRESSION: 1. Chronic cardiomegaly and trace left pleural effusion. 2. Suspect mild atelectasis or scarring. Electronically Signed   By: Jorje Guild M.D.   On: 01/12/2021 08:59   DG C-Arm 1-60 Min-No Report  Result Date: 01/12/2021 Fluoroscopy was utilized by the requesting physician.  No radiographic interpretation.   DG HIP UNILAT WITH PELVIS 2-3 VIEWS LEFT  Result Date: 01/12/2021 CLINICAL DATA:  Placement of left intramedullary nail. EXAM: DG HIP (WITH OR WITHOUT PELVIS) 2-3V LEFT COMPARISON:  Earlier same day left hip radiograph at 0823 hours FINDINGS: Fluoroscopic images were obtained intraoperatively and submitted for post operative interpretation. 6 images were obtained with 60 seconds of fluoroscopy time. Provided images demonstrate placement of left intramedullary rod with proximal lag screw and distal fixation screw. Hardware is in appropriate position. Please see the performing provider's procedural report for further detail. IMPRESSION: Intraoperative imaging for placement of left intramedullary nail. Please see performing provider's procedural report for further details. Electronically Signed   By: Ileana Roup M.D.   On: 01/12/2021 16:31   DG FEMUR MIN 2 VIEWS LEFT  Result Date: 01/12/2021 CLINICAL DATA:  Fall in bathroom with hip pain EXAM: LEFT FEMUR 2 VIEWS COMPARISON:  None. FINDINGS: Acute intertrochanteric left femur fracture with mild varus angulation. Generalized osteopenia. The mid to distal femur is intact. Atheromatous calcification. IMPRESSION: Acute intertrochanteric left femur fracture. Electronically Signed   By: Jorje Guild M.D.   On: 01/12/2021 08:56       Subjective: Seen and examined on day of discharge.  Stable no distress.  Stable for discharge.  Will return to [facility].  Discharge Exam: Vitals:   01/20/21 0522 01/20/21 0745  BP: (!) 148/74 (!) 152/82  Pulse: 74 74  Resp: 18 18  Temp: 97.6 F (36.4 C) 97.8 F (36.6 C)  SpO2: 95% 95%   Vitals:   01/19/21 2157 01/20/21 0037 01/20/21 0522 01/20/21 0745  BP: (!) 154/97 (!) 133/59 (!) 148/74 (!) 152/82  Pulse: 83 67 74 74  Resp: 18  18 18   Temp: 98.1 F (36.7 C)  97.6 F (36.4 C) 97.8 F (36.6 C)  TempSrc: Oral  Oral   SpO2: 95% 98% 95% 95%  Weight:      Height:        General: Pt is alert, awake, not in acute distress Cardiovascular: RRR, S1/S2 +, no rubs, no gallops Respiratory: CTA bilaterally, no wheezing, no rhonchi Abdominal: Soft, NT, ND, bowel sounds + Extremities: no edema, no cyanosis    The results of significant diagnostics from this hospitalization (including imaging, microbiology, ancillary and laboratory) are listed below for reference.     Microbiology: Recent Results (from the past 240 hour(s))  Resp Panel by RT-PCR (Flu A&B, Covid) Nasopharyngeal Swab     Status: None   Collection Time: 01/12/21  9:10 AM   Specimen: Nasopharyngeal Swab; Nasopharyngeal(NP) swabs in vial transport medium  Result Value Ref Range Status   SARS Coronavirus 2 by RT PCR NEGATIVE NEGATIVE Final    Comment: (NOTE) SARS-CoV-2 target nucleic acids are NOT DETECTED.  The SARS-CoV-2 RNA is generally detectable in upper respiratory specimens during  the acute phase of infection. The lowest concentration of SARS-CoV-2 viral copies this assay can detect is 138 copies/mL. A negative result does not preclude SARS-Cov-2 infection and should not be used as the sole basis for treatment or other patient management decisions. A negative result may occur with  improper specimen collection/handling, submission of specimen other than nasopharyngeal swab, presence of viral  mutation(s) within the areas targeted by this assay, and inadequate number of viral copies(<138 copies/mL). A negative result must be combined with clinical observations, patient history, and epidemiological information. The expected result is Negative.  Fact Sheet for Patients:  EntrepreneurPulse.com.au  Fact Sheet for Healthcare Providers:  IncredibleEmployment.be  This test is no t yet approved or cleared by the [place] FDA and  has been authorized for detection and/or diagnosis of SARS-CoV-2 by FDA under an Emergency Use Authorization (EUA). This EUA will remain  in effect (meaning this test can be used) for the duration of the COVID-19 declaration under Section 564(b)(1) of the Act, 21 U.S.C.section 360bbb-3(b)(1), unless the authorization is terminated  or revoked sooner.       Influenza A by PCR NEGATIVE NEGATIVE Final   Influenza B by PCR NEGATIVE NEGATIVE Final    Comment: (NOTE) The Xpert Xpress SARS-CoV-2/FLU/RSV plus assay is intended as an aid in the diagnosis of influenza from Nasopharyngeal swab specimens and should not be used as a sole basis for treatment. Nasal washings and aspirates are unacceptable for Xpert Xpress SARS-CoV-2/FLU/RSV testing.  Fact Sheet for Patients: EntrepreneurPulse.com.au  Fact Sheet for Healthcare Providers: IncredibleEmployment.be  This test is not yet approved or cleared by the [place] FDA and has been authorized for detection and/or diagnosis of SARS-CoV-2 by FDA under an Emergency Use Authorization (EUA). This EUA will remain in effect (meaning this test can be used) for the duration of the COVID-19 declaration under Section 564(b)(1) of the Act, 21 U.S.C. section 360bbb-3(b)(1), unless the authorization is terminated or revoked.  Performed at [facility], [address] [place], [place] [address]   Blood Culture (routine x 2)      Status: None (Preliminary result)   Collection Time: 01/17/21  4:20 PM   Specimen: BLOOD  Result Value Ref Range Status   Specimen Description BLOOD [facility]  Final   Special Requests   Final    BOTTLES DRAWN AEROBIC AND ANAEROBIC Blood Culture adequate volume   Culture   Final    NO GROWTH 3 DAYS Performed at [facility], [address] [place], [place] [address]    Report Status PENDING  Incomplete  Resp Panel by RT-PCR (Flu A&B, Covid)     Status: None   Collection Time: 01/17/21  4:29 PM   Specimen: Nasopharyngeal(NP) swabs in vial transport medium  Result Value Ref Range Status   SARS Coronavirus 2 by RT PCR NEGATIVE NEGATIVE Final    Comment: (NOTE) SARS-CoV-2 target nucleic acids are NOT DETECTED.  The SARS-CoV-2 RNA is generally detectable in upper respiratory specimens during the acute phase of infection. The lowest concentration of SARS-CoV-2 viral copies this assay can detect is 138 copies/mL. A negative result does not preclude SARS-Cov-2 infection and should not be used as the sole basis for treatment or other patient management decisions. A negative result may occur with  improper specimen collection/handling, submission of specimen other than nasopharyngeal swab, presence of viral mutation(s) within the areas targeted by this assay, and inadequate number of viral copies(<138 copies/mL). A negative result must be combined with clinical  observations, patient history, and epidemiological information. The expected result is Negative.  Fact Sheet for Patients:  EntrepreneurPulse.com.au  Fact Sheet for Healthcare Providers:  IncredibleEmployment.be  This test is no t yet approved or cleared by the [place] FDA and  has been authorized for detection and/or diagnosis of SARS-CoV-2 by FDA under an Emergency Use Authorization (EUA). This EUA will remain  in effect (meaning this test can be used) for the duration of  the COVID-19 declaration under Section 564(b)(1) of the Act, 21 U.S.C.section 360bbb-3(b)(1), unless the authorization is terminated  or revoked sooner.       Influenza A by PCR NEGATIVE NEGATIVE Final   Influenza B by PCR NEGATIVE NEGATIVE Final    Comment: (NOTE) The Xpert Xpress SARS-CoV-2/FLU/RSV plus assay is intended as an aid in the diagnosis of influenza from Nasopharyngeal swab specimens and should not be used as a sole basis for treatment. Nasal washings and aspirates are unacceptable for Xpert Xpress SARS-CoV-2/FLU/RSV testing.  Fact Sheet for Patients: EntrepreneurPulse.com.au  Fact Sheet for Healthcare Providers: IncredibleEmployment.be  This test is not yet approved or cleared by the [place] FDA and has been authorized for detection and/or diagnosis of SARS-CoV-2 by FDA under an Emergency Use Authorization (EUA). This EUA will remain in effect (meaning this test can be used) for the duration of the COVID-19 declaration under Section 564(b)(1) of the Act, 21 U.S.C. section 360bbb-3(b)(1), unless the authorization is terminated or revoked.  Performed at [facility] [facility], [address], [place], [place] [address]   Blood Culture (routine x 2)     Status: None (Preliminary result)   Collection Time: 01/17/21  4:29 PM   Specimen: BLOOD  Result Value Ref Range Status   Specimen Description BLOOD RAC  Final   Special Requests BOTTLES DRAWN AEROBIC AND ANAEROBIC BCAV  Final   Culture   Final    NO GROWTH 3 DAYS Performed at [facility], [address] [place], [place] [address]    Report Status PENDING  Incomplete  MRSA Next Gen by PCR, Nasal     Status: None   Collection Time: 01/18/21 11:54 AM   Specimen: Nasal Mucosa; Nasal Swab  Result Value Ref Range Status   MRSA by PCR Next Gen NOT DETECTED NOT DETECTED Final    Comment: (NOTE) The GeneXpert MRSA Assay (FDA approved for NASAL specimens  only), is one component of a comprehensive MRSA colonization surveillance program. It is not intended to diagnose MRSA infection nor to guide or monitor treatment for MRSA infections. Test performance is not FDA approved in patients less than 49 years old. Performed at [facility], [address] [place], [place] [address]      Labs: BNP (last 3 results) Recent Labs    12/11/20 1302  BNP 932.3*   Basic Metabolic Panel: Recent Labs  Lab 01/14/21 0153 01/15/21 0236 01/17/21 1501 01/18/21 0602 01/19/21 2238  NA 129* 129* 134* 136 133*  K 3.7 3.4* 3.0* 3.1* 2.9*  CL 99 99 101 103 100  CO2 23 26 26 25 26   GLUCOSE 104* 97 109* 89 108*  BUN 22 21 24* 20 18  CREATININE 1.20 1.24 1.11 0.94 0.88  CALCIUM 7.5* 7.4* 7.7* 7.3* 7.7*  MG  --   --   --   --  1.7   Liver Function Tests: Recent Labs  Lab 01/14/21 0153  AST 16  ALT 5  ALKPHOS 46  BILITOT 0.6  PROT 5.0*  ALBUMIN 2.5*  No results for input(s): LIPASE, AMYLASE in the last 168 hours. No results for input(s): AMMONIA in the last 168 hours. CBC: Recent Labs  Lab 01/14/21 0153 01/15/21 0236 01/17/21 1501  WBC 8.1 7.2 5.7  HGB 7.8* 7.5* 8.4*  HCT 22.4* 22.1* 25.6*  MCV 97.8 98.2 103.2*  PLT 99* 98* 129*   Cardiac Enzymes: No results for input(s): CKTOTAL, CKMB, CKMBINDEX, TROPONINI in the last 168 hours. BNP: Invalid input(s): POCBNP CBG: Recent Labs  Lab 01/20/21 0748  GLUCAP 95   D-Dimer No results for input(s): DDIMER in the last 72 hours. Hgb A1c No results for input(s): HGBA1C in the last 72 hours. Lipid Profile Recent Labs    01/18/21 0602  CHOL 82  HDL 31*  LDLCALC 43  TRIG 38  CHOLHDL 2.6   Thyroid function studies No results for input(s): TSH, T4TOTAL, T3FREE, THYROIDAB in the last 72 hours.  Invalid input(s): FREET3 Anemia work up No results for input(s): VITAMINB12, FOLATE, FERRITIN, TIBC, IRON, RETICCTPCT in the last 72 hours. Urinalysis    Component Value  Date/Time   COLORURINE YELLOW (A) 01/17/2021 1629   APPEARANCEUR CLEAR (A) 01/17/2021 1629   APPEARANCEUR Clear 10/13/2011 1105   LABSPEC 1.009 01/17/2021 1629   LABSPEC 1.008 10/13/2011 1105   PHURINE 7.0 01/17/2021 1629   GLUCOSEU NEGATIVE 01/17/2021 1629   GLUCOSEU Negative 10/13/2011 1105   HGBUR NEGATIVE 01/17/2021 1629   BILIRUBINUR NEGATIVE 01/17/2021 1629   BILIRUBINUR Negative 10/13/2011 1105   KETONESUR NEGATIVE 01/17/2021 1629   PROTEINUR NEGATIVE 01/17/2021 1629   NITRITE NEGATIVE 01/17/2021 1629   LEUKOCYTESUR NEGATIVE 01/17/2021 1629   LEUKOCYTESUR Negative 10/13/2011 1105   Sepsis Labs Invalid input(s): PROCALCITONIN,  WBC,  LACTICIDVEN Microbiology Recent Results (from the past 240 hour(s))  Resp Panel by RT-PCR (Flu A&B, Covid) Nasopharyngeal Swab     Status: None   Collection Time: 01/12/21  9:10 AM   Specimen: Nasopharyngeal Swab; Nasopharyngeal(NP) swabs in vial transport medium  Result Value Ref Range Status   SARS Coronavirus 2 by RT PCR NEGATIVE NEGATIVE Final    Comment: (NOTE) SARS-CoV-2 target nucleic acids are NOT DETECTED.  The SARS-CoV-2 RNA is generally detectable in upper respiratory specimens during the acute phase of infection. The lowest concentration of SARS-CoV-2 viral copies this assay can detect is 138 copies/mL. A negative result does not preclude SARS-Cov-2 infection and should not be used as the sole basis for treatment or other patient management decisions. A negative result may occur with  improper specimen collection/handling, submission of specimen other than nasopharyngeal swab, presence of viral mutation(s) within the areas targeted by this assay, and inadequate number of viral copies(<138 copies/mL). A negative result must be combined with clinical observations, patient history, and epidemiological information. The expected result is Negative.  Fact Sheet for Patients:  EntrepreneurPulse.com.au  Fact Sheet  for Healthcare Providers:  IncredibleEmployment.be  This test is no t yet approved or cleared by the [place] FDA and  has been authorized for detection and/or diagnosis of SARS-CoV-2 by FDA under an Emergency Use Authorization (EUA). This EUA will remain  in effect (meaning this test can be used) for the duration of the COVID-19 declaration under Section 564(b)(1) of the Act, 21 U.S.C.section 360bbb-3(b)(1), unless the authorization is terminated  or revoked sooner.       Influenza A by PCR NEGATIVE NEGATIVE Final   Influenza B by PCR NEGATIVE NEGATIVE Final    Comment: (NOTE) The Xpert Xpress SARS-CoV-2/FLU/RSV plus assay is intended as an  aid in the diagnosis of influenza from Nasopharyngeal swab specimens and should not be used as a sole basis for treatment. Nasal washings and aspirates are unacceptable for Xpert Xpress SARS-CoV-2/FLU/RSV testing.  Fact Sheet for Patients: EntrepreneurPulse.com.au  Fact Sheet for Healthcare Providers: IncredibleEmployment.be  This test is not yet approved or cleared by the [place] FDA and has been authorized for detection and/or diagnosis of SARS-CoV-2 by FDA under an Emergency Use Authorization (EUA). This EUA will remain in effect (meaning this test can be used) for the duration of the COVID-19 declaration under Section 564(b)(1) of the Act, 21 U.S.C. section 360bbb-3(b)(1), unless the authorization is terminated or revoked.  Performed at [facility], [address] [place], [place] [address]   Blood Culture (routine x 2)     Status: None (Preliminary result)   Collection Time: 01/17/21  4:20 PM   Specimen: BLOOD  Result Value Ref Range Status   Specimen Description BLOOD BR[facility]  Final   Special Requests   Final    BOTTLES DRAWN AEROBIC AND ANAEROBIC Blood Culture adequate volume   Culture   Final    NO GROWTH 3 DAYS Performed at [facility], [address] [place], [place] [address]    Report Status PENDING  Incomplete  Resp Panel by RT-PCR (Flu A&B, Covid)     Status: None   Collection Time: 01/17/21  4:29 PM   Specimen: Nasopharyngeal(NP) swabs in vial transport medium  Result Value Ref Range Status   SARS Coronavirus 2 by RT PCR NEGATIVE NEGATIVE Final    Comment: (NOTE) SARS-CoV-2 target nucleic acids are NOT DETECTED.  The SARS-CoV-2 RNA is generally detectable in upper respiratory specimens during the acute phase of infection. The lowest concentration of SARS-CoV-2 viral copies this assay can detect is 138 copies/mL. A negative result does not preclude SARS-Cov-2 infection and should not be used as the sole basis for treatment or other patient management decisions. A negative result may occur with  improper specimen collection/handling, submission of specimen other than nasopharyngeal swab, presence of viral mutation(s) within the areas targeted by this assay, and inadequate number of viral copies(<138 copies/mL). A negative result must be combined with clinical observations, patient history, and epidemiological information. The expected result is Negative.  Fact Sheet for Patients:  EntrepreneurPulse.com.au  Fact Sheet for Healthcare Providers:  IncredibleEmployment.be  This test is no t yet approved or cleared by the [place] FDA and  has been authorized for detection and/or diagnosis of SARS-CoV-2 by FDA under an Emergency Use Authorization (EUA). This EUA will remain  in effect (meaning this test can be used) for the duration of the COVID-19 declaration under Section 564(b)(1) of the Act, 21 U.S.C.section 360bbb-3(b)(1), unless the authorization is terminated  or revoked sooner.       Influenza A by PCR NEGATIVE NEGATIVE Final   Influenza B by PCR NEGATIVE NEGATIVE Final    Comment: (NOTE) The Xpert Xpress SARS-CoV-2/FLU/RSV plus assay is intended as an aid in the  diagnosis of influenza from Nasopharyngeal swab specimens and should not be used as a sole basis for treatment. Nasal washings and aspirates are unacceptable for Xpert Xpress SARS-CoV-2/FLU/RSV testing.  Fact Sheet for Patients: EntrepreneurPulse.com.au  Fact Sheet for Healthcare Providers: IncredibleEmployment.be  This test is not yet approved or cleared by the [place] FDA and has been authorized for detection and/or diagnosis of SARS-CoV-2 by FDA under an Emergency Use Authorization (EUA). This EUA will remain in effect (meaning this test  can be used) for the duration of the COVID-19 declaration under Section 564(b)(1) of the Act, 21 U.S.C. section 360bbb-3(b)(1), unless the authorization is terminated or revoked.  Performed at [facility], [address] [place], [place] [address]   Blood Culture (routine x 2)     Status: None (Preliminary result)   Collection Time: 01/17/21  4:29 PM   Specimen: BLOOD  Result Value Ref Range Status   Specimen Description BLOOD RAC  Final   Special Requests BOTTLES DRAWN AEROBIC AND ANAEROBIC BCAV  Final   Culture   Final    NO GROWTH 3 DAYS Performed at [facility], [address], [place], [place] [address]    Report Status PENDING  Incomplete  MRSA Next Gen by PCR, Nasal     Status: None   Collection Time: 01/18/21 11:54 AM   Specimen: Nasal Mucosa; Nasal Swab  Result Value Ref Range Status   MRSA by PCR Next Gen NOT DETECTED NOT DETECTED Final    Comment: (NOTE) The GeneXpert MRSA Assay (FDA approved for NASAL specimens only), is one component of a comprehensive MRSA colonization surveillance program. It is not intended to diagnose MRSA infection nor to guide or monitor treatment for MRSA infections. Test performance is not FDA approved in patients less than 75 years old. Performed at [facility], [address] [place], [place] [address]      Time  coordinating discharge: Over 30 minutes  SIGNED:   Sidney Ace, MD  [facility] 01/20/2021, 9:17 AM Pager   If 7PM-7AM, please contact night-coverage

## 2021-01-20 NOTE — NC FL2 (Signed)
[place] LEVEL OF CARE SCREENING TOOL     IDENTIFICATION  Patient Name: Thomas Matthews Birthdate: 09/26/1923 Sex: male Admission Date (Current Location): 01/17/2021  [facility] and [place] Number:      Facility and Address:         Provider Number:    Attending Physician Name and Address:  Sidney Ace, MD  Relative Name and Phone Number:       Current Level of Care:   Recommended Level of Care:   Prior Approval Number:    Date Approved/Denied:   PASRR Number:    Discharge Plan:      Current Diagnoses: Patient Active Problem List   Diagnosis Date Noted   Hypokalemia 33/82/5053   Acute metabolic encephalopathy 97/67/3419   H/O sick sinus syndrome 01/13/2021   BPH (benign prostatic hyperplasia) 01/13/2021   Femur fracture, left ([place]) 01/12/2021   Atrial fibrillation, chronic ([place]) 01/12/2021   Chronic diastolic CHF (congestive heart failure) ([place]) 01/12/2021   COPD (chronic obstructive pulmonary disease) ([place]) 01/12/2021   Protein-calorie malnutrition, severe 12/14/2020   Pressure injury of skin 12/13/2020   NSVT (nonsustained ventricular tachycardia) 12/11/2020   S/P TAVR (transcatheter aortic valve replacement) 12/11/2020   Acid reflux 07/27/2017   Acne erythematosa 07/27/2017   Flutter-fibrillation 07/27/2017   BP (high blood pressure) 07/27/2017   Cardiac pacemaker in situ 07/27/2017   CAD (coronary artery disease) 07/27/2017   DD (diverticular disease) 07/27/2017   History of GI bleed 04/16/2008   Benign neoplasm of colon 02/27/2004    Orientation RESPIRATION BLADDER Height & Weight        Normal Continent Weight: 59 kg Height:  5\' 7"  (170.2 cm)  BEHAVIORAL SYMPTOMS/MOOD NEUROLOGICAL BOWEL NUTRITION STATUS      Continent Diet (Heart Healthy)  AMBULATORY STATUS COMMUNICATION OF NEEDS Skin   Limited Assist (Sit to supine moderate) Verbally                         Personal Care Assistance Level of Assistance     Bathing Assistance: Limited assistance Feeding assistance: Limited assistance Dressing Assistance: Limited assistance     Functional Limitations Info  Sight, Hearing, Speech          SPECIAL CARE FACTORS FREQUENCY  PT (By licensed PT), OT (By licensed OT)     PT Frequency: 5x weekly OT Frequency: 5x weekly            Contractures      Additional Factors Info  Code Status, Allergies               Current Medications (01/20/2021):  This is the current hospital active medication list Current Facility-Administered Medications  Medication Dose Route Frequency Provider Last Rate Last Admin   acetaminophen (TYLENOL) tablet 650 mg  650 mg Oral Q4H PRN Athena Masse, MD   650 mg at 01/19/21 1714   Or   acetaminophen (TYLENOL) 160 MG/5ML solution 650 mg  650 mg Per Tube Q4H PRN Athena Masse, MD       Or   acetaminophen (TYLENOL) suppository 650 mg  650 mg Rectal Q4H PRN Athena Masse, MD       amiodarone (PACERONE) tablet 200 mg  200 mg Oral BID Ralene Muskrat B, MD   200 mg at 01/20/21 0948   enoxaparin (LOVENOX) injection 40 mg  40 mg Subcutaneous Q24H Athena Masse, MD   40 mg at 01/19/21 2207   ferrous  sulfate tablet 325 mg  325 mg Oral BID WC Ralene Muskrat B, MD   325 mg at 01/20/21 0948   furosemide (LASIX) tablet 40 mg  40 mg Oral Daily Ralene Muskrat B, MD   40 mg at 01/20/21 0948   HYDROcodone-acetaminophen (NORCO/VICODIN) 5-325 MG per tablet 1 tablet  1 tablet Oral Q6H PRN Ralene Muskrat B, MD   1 tablet at 01/19/21 2206   metoprolol tartrate (LOPRESSOR) tablet 25 mg  25 mg Oral BID Ralene Muskrat B, MD   25 mg at 01/20/21 0948   midodrine (PROAMATINE) tablet 2.5 mg  2.5 mg Oral TID WC Sreenath, Sudheer B, MD   2.5 mg at 01/20/21 0948   pantoprazole (PROTONIX) EC tablet 40 mg  40 mg Oral Daily Ralene Muskrat B, MD   40 mg at 01/20/21 9326   senna-docusate (Senokot-S) tablet 1 tablet  1 tablet Oral Daily Ralene Muskrat B, MD   1 tablet at  01/20/21 0948   tamsulosin (FLOMAX) capsule 0.4 mg  0.4 mg Oral QPC breakfast Ralene Muskrat B, MD   0.4 mg at 01/20/21 7124     Discharge Medications: Please see discharge summary for a list of discharge medications.  Relevant Imaging Results:  Relevant Lab Results:   Additional Information    Pete Pelt, RN

## 2021-01-20 NOTE — TOC Progression Note (Addendum)
Transition of Care [facility]) - Progression Note    Patient Details  Name: Thomas Matthews MRN: [id number] Date of Birth: April 03, 1923  [facility]) CM/SW [place], RN Phone Number: 01/20/2021, 11:12 AM  Clinical Narrative:   Patient is accepted back to [facility] today.  COVID test ordered, As per Magda Paganini, patient is able to return today.  TOC will follow for COVID test results. Addendum: patient is being transported by EMS 3rd onlist to room 6[facility] at [facility] patient and family aware.   Expected Discharge Plan: [place] Barriers to Discharge: Continued Medical Work up  Expected Discharge Plan and Services Expected Discharge Plan: [place]         Expected Discharge Date: 01/20/21                                     Social Determinants of Health (SDOH) Interventions    Readmission Risk Interventions No flowsheet data found.

## 2021-01-21 ENCOUNTER — Telehealth: Payer: Self-pay

## 2021-01-21 DIAGNOSIS — I951 Orthostatic hypotension: Secondary | ICD-10-CM | POA: Diagnosis not present

## 2021-01-21 DIAGNOSIS — I5042 Chronic combined systolic (congestive) and diastolic (congestive) heart failure: Secondary | ICD-10-CM | POA: Diagnosis not present

## 2021-01-21 DIAGNOSIS — G9340 Encephalopathy, unspecified: Secondary | ICD-10-CM | POA: Diagnosis not present

## 2021-01-21 DIAGNOSIS — S72142D Displaced intertrochanteric fracture of left femur, subsequent encounter for closed fracture with routine healing: Secondary | ICD-10-CM | POA: Diagnosis not present

## 2021-01-21 DIAGNOSIS — I482 Chronic atrial fibrillation, unspecified: Secondary | ICD-10-CM | POA: Diagnosis not present

## 2021-01-21 NOTE — Telephone Encounter (Signed)
none

## 2021-01-22 ENCOUNTER — Emergency Department: Payer: Medicare Other

## 2021-01-22 ENCOUNTER — Encounter: Payer: Self-pay | Admitting: Emergency Medicine

## 2021-01-22 ENCOUNTER — Inpatient Hospital Stay
Admission: EM | Admit: 2021-01-22 | Discharge: 2021-01-28 | DRG: 312 | Disposition: A | Discharge status: Skilled Nursing Facility | Payer: Medicare Other | Attending: Internal Medicine | Admitting: Internal Medicine

## 2021-01-22 ENCOUNTER — Other Ambulatory Visit: Payer: Self-pay

## 2021-01-22 DIAGNOSIS — I959 Hypotension, unspecified: Secondary | ICD-10-CM | POA: Diagnosis not present

## 2021-01-22 DIAGNOSIS — E876 Hypokalemia: Secondary | ICD-10-CM | POA: Diagnosis present

## 2021-01-22 DIAGNOSIS — S72142D Displaced intertrochanteric fracture of left femur, subsequent encounter for closed fracture with routine healing: Secondary | ICD-10-CM

## 2021-01-22 DIAGNOSIS — N1831 Chronic kidney disease, stage 3a: Secondary | ICD-10-CM | POA: Diagnosis present

## 2021-01-22 DIAGNOSIS — R1030 Lower abdominal pain, unspecified: Secondary | ICD-10-CM | POA: Diagnosis not present

## 2021-01-22 DIAGNOSIS — Z952 Presence of prosthetic heart valve: Secondary | ICD-10-CM

## 2021-01-22 DIAGNOSIS — Z888 Allergy status to other drugs, medicaments and biological substances status: Secondary | ICD-10-CM

## 2021-01-22 DIAGNOSIS — N179 Acute kidney failure, unspecified: Secondary | ICD-10-CM | POA: Diagnosis present

## 2021-01-22 DIAGNOSIS — R0602 Shortness of breath: Secondary | ICD-10-CM | POA: Diagnosis not present

## 2021-01-22 DIAGNOSIS — I4729 Other ventricular tachycardia: Secondary | ICD-10-CM | POA: Diagnosis not present

## 2021-01-22 DIAGNOSIS — Z79899 Other long term (current) drug therapy: Secondary | ICD-10-CM

## 2021-01-22 DIAGNOSIS — I2510 Atherosclerotic heart disease of native coronary artery without angina pectoris: Secondary | ICD-10-CM | POA: Diagnosis present

## 2021-01-22 DIAGNOSIS — Z7401 Bed confinement status: Secondary | ICD-10-CM | POA: Diagnosis not present

## 2021-01-22 DIAGNOSIS — R680 Hypothermia, not associated with low environmental temperature: Secondary | ICD-10-CM | POA: Diagnosis present

## 2021-01-22 DIAGNOSIS — I5032 Chronic diastolic (congestive) heart failure: Secondary | ICD-10-CM | POA: Diagnosis present

## 2021-01-22 DIAGNOSIS — D509 Iron deficiency anemia, unspecified: Secondary | ICD-10-CM | POA: Diagnosis present

## 2021-01-22 DIAGNOSIS — I130 Hypertensive heart and chronic kidney disease with heart failure and stage 1 through stage 4 chronic kidney disease, or unspecified chronic kidney disease: Secondary | ICD-10-CM | POA: Diagnosis present

## 2021-01-22 DIAGNOSIS — I495 Sick sinus syndrome: Secondary | ICD-10-CM | POA: Diagnosis present

## 2021-01-22 DIAGNOSIS — Z87891 Personal history of nicotine dependence: Secondary | ICD-10-CM

## 2021-01-22 DIAGNOSIS — N401 Enlarged prostate with lower urinary tract symptoms: Secondary | ICD-10-CM | POA: Diagnosis present

## 2021-01-22 DIAGNOSIS — M8580 Other specified disorders of bone density and structure, unspecified site: Secondary | ICD-10-CM | POA: Diagnosis not present

## 2021-01-22 DIAGNOSIS — W19XXXD Unspecified fall, subsequent encounter: Secondary | ICD-10-CM | POA: Diagnosis not present

## 2021-01-22 DIAGNOSIS — D62 Acute posthemorrhagic anemia: Secondary | ICD-10-CM | POA: Diagnosis not present

## 2021-01-22 DIAGNOSIS — J449 Chronic obstructive pulmonary disease, unspecified: Secondary | ICD-10-CM | POA: Diagnosis present

## 2021-01-22 DIAGNOSIS — F0390 Unspecified dementia without behavioral disturbance: Secondary | ICD-10-CM | POA: Diagnosis present

## 2021-01-22 DIAGNOSIS — E872 Acidosis, unspecified: Secondary | ICD-10-CM | POA: Diagnosis present

## 2021-01-22 DIAGNOSIS — I5042 Chronic combined systolic (congestive) and diastolic (congestive) heart failure: Secondary | ICD-10-CM | POA: Diagnosis not present

## 2021-01-22 DIAGNOSIS — R54 Age-related physical debility: Secondary | ICD-10-CM | POA: Diagnosis present

## 2021-01-22 DIAGNOSIS — I471 Supraventricular tachycardia: Secondary | ICD-10-CM | POA: Diagnosis present

## 2021-01-22 DIAGNOSIS — Z950 Presence of cardiac pacemaker: Secondary | ICD-10-CM | POA: Diagnosis not present

## 2021-01-22 DIAGNOSIS — R338 Other retention of urine: Secondary | ICD-10-CM | POA: Diagnosis present

## 2021-01-22 DIAGNOSIS — N189 Chronic kidney disease, unspecified: Secondary | ICD-10-CM

## 2021-01-22 DIAGNOSIS — E871 Hypo-osmolality and hyponatremia: Secondary | ICD-10-CM | POA: Diagnosis present

## 2021-01-22 DIAGNOSIS — R404 Transient alteration of awareness: Secondary | ICD-10-CM | POA: Diagnosis not present

## 2021-01-22 DIAGNOSIS — I951 Orthostatic hypotension: Secondary | ICD-10-CM | POA: Diagnosis present

## 2021-01-22 DIAGNOSIS — K6289 Other specified diseases of anus and rectum: Secondary | ICD-10-CM | POA: Diagnosis not present

## 2021-01-22 DIAGNOSIS — I350 Nonrheumatic aortic (valve) stenosis: Secondary | ICD-10-CM | POA: Diagnosis not present

## 2021-01-22 DIAGNOSIS — K449 Diaphragmatic hernia without obstruction or gangrene: Secondary | ICD-10-CM | POA: Diagnosis not present

## 2021-01-22 DIAGNOSIS — Z8719 Personal history of other diseases of the digestive system: Secondary | ICD-10-CM | POA: Diagnosis not present

## 2021-01-22 DIAGNOSIS — E785 Hyperlipidemia, unspecified: Secondary | ICD-10-CM | POA: Diagnosis present

## 2021-01-22 DIAGNOSIS — I517 Cardiomegaly: Secondary | ICD-10-CM | POA: Diagnosis not present

## 2021-01-22 DIAGNOSIS — W1830XD Fall on same level, unspecified, subsequent encounter: Secondary | ICD-10-CM

## 2021-01-22 DIAGNOSIS — I10 Essential (primary) hypertension: Secondary | ICD-10-CM | POA: Diagnosis present

## 2021-01-22 DIAGNOSIS — K5900 Constipation, unspecified: Secondary | ICD-10-CM | POA: Diagnosis present

## 2021-01-22 DIAGNOSIS — G629 Polyneuropathy, unspecified: Secondary | ICD-10-CM | POA: Diagnosis not present

## 2021-01-22 DIAGNOSIS — N400 Enlarged prostate without lower urinary tract symptoms: Secondary | ICD-10-CM | POA: Diagnosis present

## 2021-01-22 DIAGNOSIS — H9190 Unspecified hearing loss, unspecified ear: Secondary | ICD-10-CM | POA: Diagnosis present

## 2021-01-22 DIAGNOSIS — Z20822 Contact with and (suspected) exposure to covid-19: Secondary | ICD-10-CM | POA: Diagnosis present

## 2021-01-22 DIAGNOSIS — G9341 Metabolic encephalopathy: Secondary | ICD-10-CM | POA: Diagnosis not present

## 2021-01-22 DIAGNOSIS — K4090 Unilateral inguinal hernia, without obstruction or gangrene, not specified as recurrent: Secondary | ICD-10-CM | POA: Diagnosis not present

## 2021-01-22 DIAGNOSIS — K6389 Other specified diseases of intestine: Secondary | ICD-10-CM | POA: Diagnosis not present

## 2021-01-22 DIAGNOSIS — R9431 Abnormal electrocardiogram [ECG] [EKG]: Secondary | ICD-10-CM | POA: Diagnosis not present

## 2021-01-22 DIAGNOSIS — I482 Chronic atrial fibrillation, unspecified: Secondary | ICD-10-CM | POA: Diagnosis present

## 2021-01-22 DIAGNOSIS — Z9181 History of falling: Secondary | ICD-10-CM

## 2021-01-22 DIAGNOSIS — E43 Unspecified severe protein-calorie malnutrition: Secondary | ICD-10-CM | POA: Diagnosis not present

## 2021-01-22 DIAGNOSIS — Z466 Encounter for fitting and adjustment of urinary device: Secondary | ICD-10-CM | POA: Diagnosis not present

## 2021-01-22 DIAGNOSIS — W19XXXA Unspecified fall, initial encounter: Secondary | ICD-10-CM | POA: Diagnosis not present

## 2021-01-22 DIAGNOSIS — N182 Chronic kidney disease, stage 2 (mild): Secondary | ICD-10-CM | POA: Diagnosis not present

## 2021-01-22 DIAGNOSIS — J90 Pleural effusion, not elsewhere classified: Secondary | ICD-10-CM | POA: Diagnosis not present

## 2021-01-22 DIAGNOSIS — Z8616 Personal history of COVID-19: Secondary | ICD-10-CM | POA: Diagnosis not present

## 2021-01-22 LAB — RESP PANEL BY RT-PCR (FLU A&B, COVID) ARPGX2
Influenza A by PCR: NEGATIVE
Influenza B by PCR: NEGATIVE
SARS Coronavirus 2 by RT PCR: NEGATIVE

## 2021-01-22 LAB — CBC WITH DIFFERENTIAL/PLATELET
Abs Immature Granulocytes: 0.19 10*3/uL — ABNORMAL HIGH (ref 0.00–0.07)
Basophils Absolute: 0 10*3/uL (ref 0.0–0.1)
Basophils Relative: 0 %
Eosinophils Absolute: 0.1 10*3/uL (ref 0.0–0.5)
Eosinophils Relative: 1 %
HCT: 27.5 % — ABNORMAL LOW (ref 39.0–52.0)
Hemoglobin: 9 g/dL — ABNORMAL LOW (ref 13.0–17.0)
Immature Granulocytes: 2 %
Lymphocytes Relative: 6 %
Lymphs Abs: 0.5 10*3/uL — ABNORMAL LOW (ref 0.7–4.0)
MCH: 33.5 pg (ref 26.0–34.0)
MCHC: 32.7 g/dL (ref 30.0–36.0)
MCV: 102.2 fL — ABNORMAL HIGH (ref 80.0–100.0)
Monocytes Absolute: 0.5 10*3/uL (ref 0.1–1.0)
Monocytes Relative: 6 %
Neutro Abs: 6.8 10*3/uL (ref 1.7–7.7)
Neutrophils Relative %: 85 %
Platelets: 263 10*3/uL (ref 150–400)
RBC: 2.69 MIL/uL — ABNORMAL LOW (ref 4.22–5.81)
RDW: 17.1 % — ABNORMAL HIGH (ref 11.5–15.5)
WBC: 8.2 10*3/uL (ref 4.0–10.5)
nRBC: 0.5 % — ABNORMAL HIGH (ref 0.0–0.2)

## 2021-01-22 LAB — COMPREHENSIVE METABOLIC PANEL
ALT: 12 U/L (ref 0–44)
AST: 28 U/L (ref 15–41)
Albumin: 2.8 g/dL — ABNORMAL LOW (ref 3.5–5.0)
Alkaline Phosphatase: 67 U/L (ref 38–126)
Anion gap: 10 (ref 5–15)
BUN: 19 mg/dL (ref 8–23)
CO2: 22 mmol/L (ref 22–32)
Calcium: 8.4 mg/dL — ABNORMAL LOW (ref 8.9–10.3)
Chloride: 96 mmol/L — ABNORMAL LOW (ref 98–111)
Creatinine, Ser: 1.15 mg/dL (ref 0.61–1.24)
GFR, Estimated: 59 mL/min — ABNORMAL LOW (ref >60)
Glucose, Bld: 117 mg/dL — ABNORMAL HIGH (ref 70–99)
Potassium: 3.5 mmol/L (ref 3.5–5.1)
Sodium: 128 mmol/L — ABNORMAL LOW (ref 135–145)
Total Bilirubin: 1.4 mg/dL — ABNORMAL HIGH (ref 0.3–1.2)
Total Protein: 5.8 g/dL — ABNORMAL LOW (ref 6.5–8.1)

## 2021-01-22 LAB — CULTURE, BLOOD (ROUTINE X 2)
Culture: NO GROWTH
Culture: NO GROWTH
Special Requests: ADEQUATE

## 2021-01-22 LAB — URINALYSIS, COMPLETE (UACMP) WITH MICROSCOPIC
Bacteria, UA: NONE SEEN
Bilirubin Urine: NEGATIVE
Glucose, UA: NEGATIVE mg/dL
Hgb urine dipstick: NEGATIVE
Ketones, ur: NEGATIVE mg/dL
Leukocytes,Ua: NEGATIVE
Nitrite: NEGATIVE
Protein, ur: NEGATIVE mg/dL
Specific Gravity, Urine: 1.015 (ref 1.005–1.030)
Squamous Epithelial / HPF: NONE SEEN (ref 0–5)
pH: 7 (ref 5.0–8.0)

## 2021-01-22 LAB — LACTIC ACID, PLASMA
Lactic Acid, Venous: 3 mmol/L (ref 0.5–1.9)
Lactic Acid, Venous: 1 mmol/L (ref 0.5–1.9)

## 2021-01-22 LAB — PROCALCITONIN: Procalcitonin: <0.1 ng/mL

## 2021-01-22 LAB — TYPE AND SCREEN
ABO/RH(D): O POS
Antibody Screen: NEGATIVE

## 2021-01-22 MED ORDER — HEPARIN SODIUM (PORCINE) 5000 UNIT/ML IJ SOLN
5000.0000 [IU] | Freq: Three times a day (TID) | INTRAMUSCULAR | Status: DC
  Administered 2021-01-22 – 2021-01-28 (×16): 5000 [IU] via SUBCUTANEOUS
  Filled 2021-01-22 (×17): qty 1

## 2021-01-22 MED ORDER — PANTOPRAZOLE SODIUM 40 MG IV SOLR
40.0000 mg | INTRAVENOUS | Status: DC
  Administered 2021-01-22: 40 mg via INTRAVENOUS
  Filled 2021-01-22: qty 40

## 2021-01-22 MED ORDER — VANCOMYCIN HCL 1250 MG/250ML IV SOLN
1250.0000 mg | Freq: Once | INTRAVENOUS | Status: AC
  Administered 2021-01-22: 1250 mg via INTRAVENOUS
  Filled 2021-01-22: qty 250

## 2021-01-22 MED ORDER — SODIUM CHLORIDE 0.9 % IV SOLN
2.0000 g | Freq: Once | INTRAVENOUS | Status: AC
  Administered 2021-01-22: 2 g via INTRAVENOUS
  Filled 2021-01-22: qty 2

## 2021-01-22 MED ORDER — VANCOMYCIN HCL 750 MG/150ML IV SOLN
750.0000 mg | INTRAVENOUS | Status: DC
  Filled 2021-01-22: qty 150

## 2021-01-22 MED ORDER — PRAVASTATIN SODIUM 20 MG PO TABS
10.0000 mg | ORAL_TABLET | Freq: Every day | ORAL | Status: DC
  Administered 2021-01-22 – 2021-01-26 (×5): 10 mg via ORAL
  Filled 2021-01-22 (×5): qty 1

## 2021-01-22 MED ORDER — HYDROCODONE-ACETAMINOPHEN 5-325 MG PO TABS
1.0000 | ORAL_TABLET | Freq: Four times a day (QID) | ORAL | Status: DC | PRN
  Administered 2021-01-25 (×2): 1 via ORAL
  Filled 2021-01-22 (×2): qty 1

## 2021-01-22 MED ORDER — SODIUM CHLORIDE 0.9 % IV SOLN
2.0000 g | Freq: Two times a day (BID) | INTRAVENOUS | Status: DC
  Administered 2021-01-23: 2 g via INTRAVENOUS
  Filled 2021-01-22 (×2): qty 2

## 2021-01-22 MED ORDER — PANTOPRAZOLE SODIUM 40 MG PO TBEC
40.0000 mg | DELAYED_RELEASE_TABLET | Freq: Two times a day (BID) | ORAL | Status: DC
  Administered 2021-01-23 – 2021-01-28 (×11): 40 mg via ORAL
  Filled 2021-01-22 (×11): qty 1

## 2021-01-22 MED ORDER — VITAMIN D 25 MCG (1000 UNIT) PO TABS
2000.0000 [IU] | ORAL_TABLET | Freq: Every day | ORAL | Status: DC
  Administered 2021-01-23 – 2021-01-28 (×6): 2000 [IU] via ORAL
  Filled 2021-01-22 (×6): qty 2

## 2021-01-22 MED ORDER — MIDODRINE HCL 5 MG PO TABS
2.5000 mg | ORAL_TABLET | Freq: Three times a day (TID) | ORAL | Status: DC
  Administered 2021-01-23: 2.5 mg via ORAL
  Filled 2021-01-22 (×2): qty 1

## 2021-01-22 MED ORDER — AMIODARONE HCL 200 MG PO TABS
200.0000 mg | ORAL_TABLET | Freq: Two times a day (BID) | ORAL | Status: DC
  Administered 2021-01-22 – 2021-01-28 (×12): 200 mg via ORAL
  Filled 2021-01-22 (×12): qty 1

## 2021-01-22 MED ORDER — ENOXAPARIN SODIUM 30 MG/0.3ML IJ SOSY: 30.0000 mg | PREFILLED_SYRINGE | INTRAMUSCULAR | Status: DC

## 2021-01-22 MED ORDER — TAMSULOSIN HCL 0.4 MG PO CAPS
0.4000 mg | ORAL_CAPSULE | Freq: Every day | ORAL | Status: DC
  Administered 2021-01-23 – 2021-01-28 (×6): 0.4 mg via ORAL
  Filled 2021-01-22 (×6): qty 1

## 2021-01-22 MED ORDER — FESOTERODINE FUMARATE ER 8 MG PO TB24
8.0000 mg | ORAL_TABLET | Freq: Every day | ORAL | Status: DC
  Administered 2021-01-23 – 2021-01-24 (×2): 8 mg via ORAL
  Filled 2021-01-22 (×2): qty 1

## 2021-01-22 MED ORDER — SODIUM CHLORIDE 0.9 % IV BOLUS
1000.0000 mL | Freq: Once | INTRAVENOUS | Status: AC
  Administered 2021-01-22: 1000 mL via INTRAVENOUS

## 2021-01-22 NOTE — Consult Note (Signed)
Pharmacy Antibiotic Note  Thomas Matthews is a 87 y.o. male admitted on 01/22/2021 with sepsis.  Pharmacy has been consulted for Vancomycin dosing.  Plan: Vancomycin 750 mg IV Q 24 hrs. Goal AUC 400-550. Expected AUC: 516 Expected Css: 14.3 SCr used: 1.15   Height: 5\' 7"  (170.2 cm) IBW/kg (Calculated) : 66.1  Temp (24hrs), Avg:97.4 F (36.3 C), Min:96.9 F (36.1 C), Max:98.1 F (36.7 C)  Recent Labs  Lab 01/17/21 1501 01/17/21 1629 01/18/21 0602 01/19/21 2238 01/22/21 1317 01/22/21 1355  WBC 5.7  --   --   --  8.2  --   CREATININE 1.11  --  0.94 0.88 1.15  --   LATICACIDVEN  --  1.5  --   --   --  3.0*    Estimated Creatinine Clearance: 32.1 mL/min (by C-G formula based on SCr of 1.15 mg/dL).    Allergies  Allergen Reactions   Amlodipine Besylate Swelling   Felodipine Swelling   Oxybutynin Chloride Other (See Comments)    Other reaction(s): Dizziness Tolerates taking at night   Simvastatin     Other reaction(s): Dizziness   Terazosin     Other reaction(s): Low blood pressure    Antimicrobials this admission: Vancomycin 12/7 >>  Cefepime 12/7 >>   Microbiology results: 12/7 BCx: pending 12/7 UCx: pending  12/7 MRSA PCR: pending  Thank you for allowing pharmacy to be a part of this patient's care.  Pearla Dubonnet 01/22/2021 8:52 PM

## 2021-01-22 NOTE — ED Provider Notes (Signed)
[facility]  ____________________________________________   Event Date/Time   First MD Initiated Contact with Patient 01/22/21 1309     (approximate)  I have reviewed the triage vital signs and the nursing notes.   HISTORY  Chief Complaint No chief complaint on file.    HPI Thomas Matthews is a 87 y.o. male pmh CAD, diastolic CHF, EF 50 to 94%, chronic A. fib no longer on anticoagulation due to GI bleed requiring transfusions, s/p TAVR 2019 for severe aortic stenosis, HTN who presents with hypotension.  Patient presents from the rehab facility for low blood pressure.  Apparently he was 60s over 63s with EMS but on arrival to the ED he is 92/52 prior to receiving any fluids.  Patient denies complaints currently.  He denies chest pain shortness of breath abdominal pain nausea vomiting or dysuria.  Currently feels at his baseline.  Patient was recently admitted to the hospital after a syncopal event.  He was found to be hypotensive and this was thought to be due to dehydration and poor p.o. intake.  His metoprolol was decreased and he was continued on midodrine.         History reviewed. No pertinent past medical history.  Patient Active Problem List   Diagnosis Date Noted   Hypokalemia 49/67/5916   Acute metabolic encephalopathy 38/46/6599   H/O sick sinus syndrome 01/13/2021   BPH (benign prostatic hyperplasia) 01/13/2021   Femur fracture, left ([place]) 01/12/2021   Atrial fibrillation, chronic (HCC) 01/12/2021   Chronic diastolic CHF (congestive heart failure) ([place]) 01/12/2021   COPD (chronic obstructive pulmonary disease) ([place]) 01/12/2021   Protein-calorie malnutrition, severe 12/14/2020   Pressure injury of skin 12/13/2020   NSVT (nonsustained ventricular tachycardia) 12/11/2020   S/P TAVR (transcatheter aortic valve replacement) 12/11/2020   Acid reflux 07/27/2017   Acne erythematosa 07/27/2017   Flutter-fibrillation 07/27/2017   BP (high blood  pressure) 07/27/2017   Cardiac pacemaker in situ 07/27/2017   CAD (coronary artery disease) 07/27/2017   DD (diverticular disease) 07/27/2017   History of GI bleed 04/16/2008   Benign neoplasm of colon 02/27/2004    Past Surgical History:  Procedure Laterality Date   INTRAMEDULLARY (IM) NAIL INTERTROCHANTERIC Left 01/12/2021   Procedure: INTRAMEDULLARY (IM) NAIL INTERTROCHANTRIC;  Surgeon: Corky Mull, MD;  Location: [facility] ORS;  Service: Orthopedics;  Laterality: Left;    Prior to Admission medications   Medication Sig Start Date End Date Taking? Authorizing Provider  amiodarone (PACERONE) 200 MG tablet Take 1 tablet (200 mg total) by mouth 2 (two) times daily. 12/17/20   [place] Donnamarie Poag, DO  calcium citrate-vitamin D (CITRACAL+D) 315-200 MG-UNIT tablet Take 2 tablets by mouth daily.    [provider]  cholecalciferol (VITAMIN D3) 25 MCG (1000 UNIT) tablet Take 2,000 Units by mouth daily.    [provider]  enoxaparin (LOVENOX) 30 MG/0.3ML injection Inject 0.3 mLs (30 mg total) into the skin daily. 01/15/21   Lattie Corns, PA-C  ferrous sulfate 325 (65 FE) MG tablet Take 1 tablet (325 mg total) by mouth 2 (two) times daily with a meal. 12/17/20   [place] Donnamarie Poag, DO  furosemide (LASIX) 40 MG tablet Take 1 tablet (40 mg total) by mouth daily. 12/17/20   [place] Donnamarie Poag, DO  HYDROcodone-acetaminophen (NORCO/VICODIN) 5-325 MG tablet Take 1 tablet by mouth every 6 (six) hours as needed for up to 2 days for moderate pain. SNF use only 01/20/21 01/22/21  Sidney Ace, MD  metoprolol  tartrate (LOPRESSOR) 25 MG tablet Take 1 tablet (25 mg total) by mouth 2 (two) times daily. 01/20/21   Sidney Ace, MD  midodrine (PROAMATINE) 2.5 MG tablet Take 1 tablet (2.5 mg total) by mouth 3 (three) times daily with meals. 01/20/21   Sidney Ace, MD  pantoprazole (PROTONIX) 40 MG tablet Take 40 mg by mouth 2 (two) times daily.    [provider]  pravastatin  (PRAVACHOL) 20 MG tablet Take 10 mg by mouth daily.    [provider]  senna-docusate (SENOKOT-S) 8.6-50 MG tablet Take 1 tablet by mouth daily.    [provider]  tamsulosin (FLOMAX) 0.4 MG CAPS capsule Take 0.4 mg by mouth daily after breakfast. 11/11/11   [provider]  tolterodine (DETROL LA) 4 MG 24 hr capsule Take 4 mg by mouth daily. 03/14/20   [provider]    Allergies Amlodipine besylate, Felodipine, Oxybutynin chloride, Simvastatin, and Terazosin  No family history on file.  Social History Social History   Tobacco Use   Smoking status: Former   Smokeless tobacco: Never  Substance Use Topics   Alcohol use: No   Drug use: No    Review of Systems   Review of Systems  Constitutional:  Negative for chills and fever.  Respiratory:  Negative for shortness of breath.   Cardiovascular:  Negative for chest pain.  Gastrointestinal:  Negative for abdominal pain, nausea and vomiting.  All other systems reviewed and are negative.  Physical Exam Updated Vital Signs BP (!) 118/54 (BP Location: Left Arm)   Pulse 62   Temp (!) 97.2 F (36.2 C) (Oral)   Resp 20   Ht 5\' 7"  (1.702 m)   SpO2 100%   BMI 20.36 kg/m   Physical Exam Vitals and nursing note reviewed.  Constitutional:      General: He is not in acute distress.    Appearance: Normal appearance.  HENT:     Head: Normocephalic and atraumatic.  Eyes:     General: No scleral icterus.    Conjunctiva/sclera: Conjunctivae normal.  Cardiovascular:     Rate and Rhythm: Normal rate and regular rhythm.  Pulmonary:     Effort: Pulmonary effort is normal. No respiratory distress.     Breath sounds: Normal breath sounds. No wheezing.  Abdominal:     General: Abdomen is flat.     Palpations: Abdomen is soft.     Tenderness: There is no abdominal tenderness. There is no guarding.  Musculoskeletal:        General: No deformity or signs of injury.     Cervical back: Normal range of  motion.  Skin:    Coloration: Skin is not jaundiced or pale.  Neurological:     General: No focal deficit present.     Mental Status: He is alert. Mental status is at baseline.     Comments: Patient is alert, he is oriented to person and place, he moves all extremities spontaneously  Psychiatric:        Mood and Affect: Mood normal.        Behavior: Behavior normal.     LABS (all labs ordered are listed, but only abnormal results are displayed)  Labs Reviewed  COMPREHENSIVE METABOLIC PANEL - Abnormal; Notable for the following components:      Result Value   Sodium 128 (*)    Chloride 96 (*)    Glucose, Bld 117 (*)    Calcium 8.4 (*)    Total  Protein 5.8 (*)    Albumin 2.8 (*)    Total Bilirubin 1.4 (*)    GFR, Estimated 59 (*)    All other components within normal limits  CBC WITH DIFFERENTIAL/PLATELET - Abnormal; Notable for the following components:   RBC 2.69 (*)    Hemoglobin 9.0 (*)    HCT 27.5 (*)    MCV 102.2 (*)    RDW 17.1 (*)    nRBC 0.5 (*)    Lymphs Abs 0.5 (*)    Abs Immature Granulocytes 0.19 (*)    All other components within normal limits  LACTIC ACID, PLASMA - Abnormal; Notable for the following components:   Lactic Acid, Venous 3.0 (*)    All other components within normal limits  CULTURE, BLOOD (ROUTINE X 2)  CULTURE, BLOOD (ROUTINE X 2)  RESP PANEL BY RT-PCR (FLU A&B, COVID) ARPGX2  URINE CULTURE  LACTIC ACID, PLASMA  URINALYSIS, COMPLETE (UACMP) WITH MICROSCOPIC  PROCALCITONIN   ____________________________________________  EKG Atrial fibrillation, rate controlled, frequent PVCs, poor R wave progression, no acute ischemic changes  ____________________________________________  RADIOLOGY Almeta Monas, personally viewed and evaluated these images (plain radiographs) as part of my medical decision making, as well as reviewing the written report by the radiologist.  ED MD interpretation: I reviewed the chest x-ray which shows small  pleural effusions bilaterally    ____________________________________________   PROCEDURES  Procedure(s) performed (including Critical Care):  Procedures   ____________________________________________   INITIAL IMPRESSION / ASSESSMENT AND PLAN / ED COURSE     Is a 87 year old male presents for nursing facility for low blood pressure.  Apparently he was in the 60s over 40s at the facility and somewhat drowsy.  On arrival to the ED initial blood pressure 92/52.  Patient is elderly and frail however he is alert and appears well.  He has no complaints at this time and is asymptomatic.  Looking back at his recent admission, patient was hypotensive in the hospital which was thought to be due to dehydration and poor p.o. intake.  His metoprolol was decreased and he was on midodrine.  We will check a CMP CBC lactate UA and chest x-ray.  Suspect that this is similar to his prior presentation.  We will give a liter of fluids and reassess.  Patient's lactate is 3, CMP and CBC otherwise unremarkable.  His chest x-ray does not show any obvious infiltrate.  He is pending a urine.  Given his mild hypothermia hypotension and elevated lactate we will give a dose of Vanco and cefepime, cultures are being obtained.  He is signed out to the oncoming provider pending a urine and will require admission.  Clinical Course as of 01/22/21 1513  Wed Jan 22, 2021  1400 Hemoglobin(!): 9.0 Baseline [KM]    Clinical Course User Index [KM] Rada Hay, MD     ____________________________________________   FINAL CLINICAL IMPRESSION(S) / ED DIAGNOSES  Final diagnoses:  Hypotension, unspecified hypotension type     ED Discharge Orders     None        Note:  This document was prepared using Dragon voice recognition software and may include unintentional dictation errors.    Rada Hay, MD 01/22/21 [phone]

## 2021-01-22 NOTE — H&P (Addendum)
History and Physical    Thomas Matthews WUJ:[id number] DOB: 1923/11/19 DOA: 01/22/2021  PCP: Birdie Sons, MD    Patient coming from:  [facility]   Chief Complaint:  Hypotension.    HPI:  Thomas Matthews is a 87 y.o. male seen in ed with complaints of low blood pressure copy from rehab today blood pressure was reportedly 95A systolic over 21H, HPI is per chart review as pt  is elderly and has dementia and is poor historian.  Admission request for sepsis rule out, as pt was hypothermic, elevated lactic.  Patient was also recently hospitalized from 11-27 2 11-34 hip fracture status post ORIF on 27 November. Chart review shows that patient was discharged on 01-20-2021 after being admitted for altered mental status.  During his December admission medications were changed as patient was hypotensive and orthostatic and started on midodrine. Pt has past medical history of acute metabolic encephalopathy, history of GI bleed, atrial fibrillation, pacemaker, heart disease, hypotension, COPD.   ED Course:  Vitals:   01/22/21 1606 01/22/21 1722 01/22/21 1828 01/22/21 1946  BP: 128/80 (!) 167/60 126/78 (!) 122/58  Pulse: 74 71 73 70  Resp: 20 20 (!) 23 (!) 21  Temp:    98.1 F (36.7 C)  TempSrc:    Oral  SpO2: 99% 100% 99% 98%  Height:      In the emergency room patient has a temp of 97.2, blood pressure is normal at 118/54.  O2 sats of 100% on room air.  Initial blood work shows hyponatremia with a sodium of 128 glucose of 117 normal kidney function with a creatinine of 1.15 and EGFR of 59.  Elevated total bili of 1.4, normal AST of 28 and ALT of 12, elevated lactic of 3.0.  CBC shows normal white count of 8.2 hemoglobin of 9.0 MCV of 102.2 RDW 17.1 platelet count of 263.  Respiratory panel negative for flu and COVID.  Urinalysis is within normal limits, Patient had a CT angio chest PE protocol on January 17, 2021 showing bilateral pleural effusion with atelectasis no PE.  In the  emergency room there was concern for sepsis secondary to hypotension and patient was given vancomycin and cefepime along with 1 L normal saline bolus.  Initial EKG shows atrial fibrillation with PVCs. Head CT done at that time also was negative for any acute intracranial abnormality.   Pt appears septic however no source is clear. I suspect lactic is from hypotension.   Review of Systems:  Review of Systems  Unable to perform ROS: Age   History reviewed. No pertinent past medical history.  Past Surgical History:  Procedure Laterality Date   INTRAMEDULLARY (IM) NAIL INTERTROCHANTERIC Left 01/12/2021   Procedure: INTRAMEDULLARY (IM) NAIL INTERTROCHANTRIC;  Surgeon: Corky Mull, MD;  Location: [facility] ORS;  Service: Orthopedics;  Laterality: Left;     reports that he has quit smoking. He has never used smokeless tobacco. He reports that he does not drink alcohol and does not use drugs.  Allergies  Allergen Reactions   Amlodipine Besylate Swelling   Felodipine Swelling   Oxybutynin Chloride Other (See Comments)    Other reaction(s): Dizziness Tolerates taking at night   Simvastatin     Other reaction(s): Dizziness   Terazosin     Other reaction(s): Low blood pressure    History reviewed. No pertinent family history.  Prior to Admission medications   Medication Sig Start Date End Date Taking? Authorizing Provider  amiodarone (PACERONE) 200 MG  tablet Take 1 tablet (200 mg total) by mouth 2 (two) times daily. 12/17/20  Yes [place] Donnamarie Poag, DO  calcium citrate-vitamin D (CITRACAL+D) 315-200 MG-UNIT tablet Take 2 tablets by mouth daily.   Yes [provider]  cholecalciferol (VITAMIN D3) 25 MCG (1000 UNIT) tablet Take 2,000 Units by mouth daily.   Yes [provider]  enoxaparin (LOVENOX) 30 MG/0.3ML injection Inject 0.3 mLs (30 mg total) into the skin daily. 01/15/21  Yes Lattie Corns, PA-C  ferrous sulfate 325 (65 FE) MG tablet Take 1 tablet (325 mg total) by  mouth 2 (two) times daily with a meal. 12/17/20  Yes [place] Eric J, DO  furosemide (LASIX) 40 MG tablet Take 1 tablet (40 mg total) by mouth daily. 12/17/20  Yes [place] Donnamarie Poag, DO  HYDROcodone-acetaminophen (NORCO/VICODIN) 5-325 MG tablet Take 1 tablet by mouth every 6 (six) hours as needed for up to 2 days for moderate pain. SNF use only 01/20/21 01/22/21 Yes Sreenath, Sudheer B, MD  metoprolol tartrate (LOPRESSOR) 25 MG tablet Take 1 tablet (25 mg total) by mouth 2 (two) times daily. 01/20/21  Yes Sreenath, Sudheer B, MD  midodrine (PROAMATINE) 2.5 MG tablet Take 1 tablet (2.5 mg total) by mouth 3 (three) times daily with meals. 01/20/21  Yes Sreenath, Sudheer B, MD  pantoprazole (PROTONIX) 40 MG tablet Take 40 mg by mouth 2 (two) times daily.   Yes [provider]  pravastatin (PRAVACHOL) 20 MG tablet Take 10 mg by mouth daily.   Yes [provider]  senna-docusate (SENOKOT-S) 8.6-50 MG tablet Take 1 tablet by mouth daily.   Yes [provider]  tamsulosin (FLOMAX) 0.4 MG CAPS capsule Take 0.4 mg by mouth daily after breakfast. 11/11/11  Yes [provider]  tolterodine (DETROL LA) 4 MG 24 hr capsule Take 4 mg by mouth daily. 03/14/20  Yes [provider]    Physical Exam: Vitals:   01/22/21 1606 01/22/21 1722 01/22/21 1828 01/22/21 1946  BP: 128/80 (!) 167/60 126/78 (!) 122/58  Pulse: 74 71 73 70  Resp: 20 20 (!) 23 (!) 21  Temp:    98.1 F (36.7 C)  TempSrc:    Oral  SpO2: 99% 100% 99% 98%  Height:       Physical Exam Vitals and nursing note reviewed.  Constitutional:      General: He is not in acute distress.    Appearance: He is not ill-appearing.  HENT:     Head: Normocephalic and atraumatic.     Right Ear: External ear normal.     Left Ear: External ear normal.     Nose: Nose normal.     Mouth/Throat:     Mouth: Mucous membranes are moist.  Eyes:     Extraocular Movements: Extraocular movements intact.     Pupils: Pupils are  equal, round, and reactive to light.  Neck:     Vascular: No carotid bruit.  Cardiovascular:     Rate and Rhythm: Normal rate. Rhythm irregular.     Pulses: Normal pulses.     Heart sounds: Normal heart sounds.  Pulmonary:     Effort: Pulmonary effort is normal.     Breath sounds: Normal breath sounds.  Abdominal:     General: Bowel sounds are normal. There is no distension.     Palpations: Abdomen is soft.     Tenderness: There is no abdominal tenderness. There is no guarding.  Musculoskeletal:     Right lower leg: No  edema.     Left lower leg: No edema.  Skin:    General: Skin is warm.  Neurological:     General: No focal deficit present.     Mental Status: He is alert. He is disoriented.  Psychiatric:        Mood and Affect: Mood normal.        Behavior: Behavior normal.    Labs on Admission: I have personally reviewed following labs and imaging studies  No results for input(s): CKTOTAL, CKMB, TROPONINI in the last 72 hours. Lab Results  Component Value Date   WBC 8.2 01/22/2021   HGB 9.0 (L) 01/22/2021   HCT 27.5 (L) 01/22/2021   MCV 102.2 (H) 01/22/2021   PLT 263 01/22/2021    Recent Labs  Lab 01/22/21 1317  NA 128*  K 3.5  CL 96*  CO2 22  BUN 19  CREATININE 1.15  CALCIUM 8.4*  PROT 5.8*  BILITOT 1.4*  ALKPHOS 67  ALT 12  AST 28  GLUCOSE 117*   Lab Results  Component Value Date   CHOL 82 01/18/2021   HDL 31 (L) 01/18/2021   LDLCALC 43 01/18/2021   TRIG 38 01/18/2021   COVID-19 Labs No results for input(s): DDIMER, FERRITIN, LDH, CRP in the last 72 hours. Lab Results  Component Value Date   [place] NEGATIVE 01/22/2021   [place] NEGATIVE 01/20/2021   [place] NEGATIVE 01/17/2021   [place] NEGATIVE 01/12/2021    Radiological Exams on Admission: DG Chest Portable 1 View  Result Date: 01/22/2021 CLINICAL DATA:  Shortness of breath and hypotension. EXAM: PORTABLE CHEST 1 VIEW COMPARISON:  Chest x-ray dated January 17, 2021.  FINDINGS: The patient is rotated to the right. Unchanged left chest wall pacemaker. Unchanged cardiomegaly status post TAVR. Unchanged small bilateral pleural effusions. No consolidation or pneumothorax. No acute osseous abnormality. IMPRESSION: 1. Unchanged small bilateral pleural effusions. Electronically Signed   By: Titus Dubin M.D.   On: 01/22/2021 13:55    EKG: Independently reviewed.  A.fib 65 and qt of 497.   Assessment/Plan: Principal Problem:   Hypotension Active Problems:   CAD (coronary artery disease)   Chronic diastolic CHF (congestive heart failure) (HCC)   Atrial fibrillation, chronic (HCC)   COPD (chronic obstructive pulmonary disease) (HCC)   History of GI bleed   BPH (benign prostatic hyperplasia) Hypotension: Patient is a elderly 87 year old Caucasian male from nursing facility brought for low blood pressure with systolic of 0539J over 67H and on arrival her blood pressure measurements here showed systolic of 92.  Patient is cooperative but has not been able to give history and admission request for concerns of hypotension and sepsis.  Patient had a recent admission earlier this month with similar presentation with altered mental status and was found also to have hypotension and started on midodrine after it medications had been adjusted. Suspect that patient needs to have cardiology evaluation for hypotension, pacemaker check.  Orthostatic hypotension blood pressure measurements.  Blood pressure medication adjustments.  We will hold patient's metoprolol as patient is hypotensive.  Midodrine continued.  CAD: We will continue patient on statin. We will hold patient's metoprolol and diuretic therapy secondary to hypotension.  Chronic diastolic congestive heart failure: Stated patient is euvolemic no JVD no lower extremity edema. I will currently hold patient's diuretic regimen as well.  Chronic atrial fibrillation: We will continue patient on his amiodarone, Lovenox  for DVT prophylaxis,   History of GI bleed /anemia: Hemoglobin trend as follows  01/13/21 08:51  01/14/21 01:53 01/15/21 02:36 01/17/21 15:01 01/22/21 13:17  Hemoglobin 8.5 (L) 7.8 (L) 7.5 (L) 8.4 (L) 9.0 (L)  Anemia panel, PPI, GI consult per a.m. team.  BPH: Continue patient's tamsulosin, Detrol LA,   DVT prophylaxis:  Heparin  Code Status:  Full code  Family Communication:  Thomas Matthews (Daughter)  [phone] (Mobile)   Disposition Plan:  Skilled nursing  Consults called:  Cardiology consult  Admission status: Inpatient.    Para Skeans MD [facility] [phone] How to contact the [facility] Attending or Consulting provider [place] or covering provider during after hours Manuel Garcia, for this patient.    Check the care team in [facility] and look for a) attending/consulting [facility] provider listed and b) the [facility] team listed Log into [email or web address] and use [place]'s universal password to access. If you do not have the password, please contact the hospital operator. Locate the [facility] provider you are looking for under [facility] and page to a number that you can be directly reached. If you still have difficulty reaching the provider, please page the [facility] (Director on Call) for the Hospitalists listed on amion for assistance. [email or web address] Password [email or web address] 01/22/2021, 8:05 PM

## 2021-01-22 NOTE — ED Triage Notes (Signed)
Pt brought in by [facility] with c/o hypertension. His Bp for [facility] was 60/30. Fluid was given his Bp is currently 92/52 and pt is alert and is denying any complaints at this time. He does have a femur FX and staff reports that  they have not been giving him any pain medication.

## 2021-01-23 DIAGNOSIS — I951 Orthostatic hypotension: Principal | ICD-10-CM

## 2021-01-23 DIAGNOSIS — I5032 Chronic diastolic (congestive) heart failure: Secondary | ICD-10-CM | POA: Diagnosis not present

## 2021-01-23 DIAGNOSIS — I471 Supraventricular tachycardia: Secondary | ICD-10-CM

## 2021-01-23 DIAGNOSIS — N400 Enlarged prostate without lower urinary tract symptoms: Secondary | ICD-10-CM

## 2021-01-23 DIAGNOSIS — I4719 Other supraventricular tachycardia: Secondary | ICD-10-CM

## 2021-01-23 DIAGNOSIS — I482 Chronic atrial fibrillation, unspecified: Secondary | ICD-10-CM | POA: Diagnosis not present

## 2021-01-23 DIAGNOSIS — I2510 Atherosclerotic heart disease of native coronary artery without angina pectoris: Secondary | ICD-10-CM

## 2021-01-23 LAB — CK: Total CK: 16 U/L — ABNORMAL LOW (ref 49–397)

## 2021-01-23 LAB — CBC
HCT: 28.1 % — ABNORMAL LOW (ref 39.0–52.0)
Hemoglobin: 9.3 g/dL — ABNORMAL LOW (ref 13.0–17.0)
MCH: 33 pg (ref 26.0–34.0)
MCHC: 33.1 g/dL (ref 30.0–36.0)
MCV: 99.6 fL (ref 80.0–100.0)
Platelets: 277 10*3/uL (ref 150–400)
RBC: 2.82 MIL/uL — ABNORMAL LOW (ref 4.22–5.81)
RDW: 17 % — ABNORMAL HIGH (ref 11.5–15.5)
WBC: 11.1 10*3/uL — ABNORMAL HIGH (ref 4.0–10.5)
nRBC: 0 % (ref 0.0–0.2)

## 2021-01-23 LAB — CREATININE, SERUM
Creatinine, Ser: 1.16 mg/dL (ref 0.61–1.24)
GFR, Estimated: 58 mL/min — ABNORMAL LOW (ref >60)

## 2021-01-23 LAB — URINE CULTURE: Culture: NO GROWTH

## 2021-01-23 MED ORDER — MAGNESIUM OXIDE -MG SUPPLEMENT 400 (240 MG) MG PO TABS
400.0000 mg | ORAL_TABLET | Freq: Two times a day (BID) | ORAL | Status: AC
  Administered 2021-01-23 (×2): 400 mg via ORAL
  Filled 2021-01-23 (×2): qty 1

## 2021-01-23 MED ORDER — MIDODRINE HCL 5 MG PO TABS
5.0000 mg | ORAL_TABLET | Freq: Three times a day (TID) | ORAL | Status: DC
  Administered 2021-01-23 – 2021-01-24 (×3): 5 mg via ORAL
  Filled 2021-01-23 (×3): qty 1

## 2021-01-23 NOTE — Evaluation (Addendum)
Physical Therapy Evaluation Patient Details Name: Thomas Matthews MRN: [id number] DOB: 1923-09-09 Today's Date: 01/23/2021  History of Present Illness  Pt is a 87 yo man that presented to ED from STR due to low BP. Recently discharged from hospital s/p hip ORIF 11/27 ([facility]). PMH of dementia, CHF, COPD, pacemaker, HTN,    Clinical Impression  Patient alert, oriented to self only. Denied pain, but with bed mobility exhibited pain signs/symptoms with LLE movement. Per chart review prior to previous hospitalization, was at home, family assisting as needed, but was most recently at [facility].   The patient was able to follow simple one step commands [facility]). Tech at bedside to assess orthostatics, see flowsheets for details but pt still orthostatic, but denied symptoms. BP in standing 70s/40s, true ambulation deferred due to this. Pt needed maxA to come to EOB, fair sitting balance noted. Sit <> Stand with minA and RW. Exhibited buckling on LLE for a few steps at EOB, modA with RW. Able to return to supine, BP WFLs and patient able to perform several supine exercises with tactile/visual cues.  Overall the patient demonstrated deficits (see "PT Problem List") that impede the patient's functional abilities, safety, and mobility and would benefit from skilled PT intervention. Recommendation is SNF at this time to maximize function and independence.         Recommendations for follow up therapy are one component of a multi-disciplinary discharge planning process, led by the attending physician.  Recommendations may be updated based on patient status, additional functional criteria and insurance authorization.  Follow Up Recommendations Skilled nursing-short term rehab (<3 hours/day)    Assistance Recommended at Discharge Frequent or constant Supervision/Assistance  Functional Status Assessment Patient has had a recent decline in their functional status and demonstrates the ability to make significant improvements  in function in a reasonable and predictable amount of time.  Equipment Recommendations  Other (comment) (TBD)    Recommendations for Other Services       Precautions / Restrictions Precautions Precautions: Fall Precaution Comments: orthostatic Restrictions Weight Bearing Restrictions: Yes LLE Weight Bearing: Weight bearing as tolerated      Mobility  Bed Mobility Overal bed mobility: Needs Assistance Bed Mobility: Supine to Sit     Supine to sit: Max assist;HOB elevated          Transfers Overall transfer level: Needs assistance Equipment used: Rolling walker (2 wheels) Transfers: Sit to/from Stand Sit to Stand: Min assist                Ambulation/Gait         Gait velocity: decreased     General Gait Details: true ambulation deferred due to low BP (70s/40s) but patient was able to take a few steps at EOB with modA and RW, LLE buckling noted  Stairs            Wheelchair Mobility    Modified Rankin (Stroke Patients Only)       Balance Overall balance assessment: Needs assistance Sitting-balance support: No upper extremity supported;Feet supported Sitting balance-Leahy Scale: Fair Sitting balance - Comments: close sup with bilat UE support; limited movement outside immediate BOS   Standing balance support: Bilateral upper extremity supported Standing balance-Leahy Scale: Poor                               Pertinent Vitals/Pain Pain Assessment: Faces Faces Pain Scale: Hurts a little bit Pain Location: L hip  with bed mobility    Home Living Family/patient expects to be discharged to:: Skilled nursing facility                   Additional Comments: At baseline, lives home alone, active without assist device.  Since hospitalization approx 4 weeks prior (and last several days), has been in STR. chart says the plan was to transition to ALF after STR.    Prior Function Prior Level of Function : Independent/Modified  Independent             Mobility Comments: Mod indep at true baseline; has been using RW for mobility since recent hospitalization ADLs Comments: Family assists with meals (although pt able to boil eggs per dtr), set up meds. Pt still drives, is forgetful but does very well with set routines. Indep with bathing, dressing.     Hand Dominance        Extremity/Trunk Assessment   Upper Extremity Assessment Upper Extremity Assessment: Overall WFL for tasks assessed    Lower Extremity Assessment Lower Extremity Assessment: Difficult to assess due to impaired cognition    Cervical / Trunk Assessment Cervical / Trunk Assessment: Kyphotic  Communication   Communication: HOH  Cognition Arousal/Alertness: Awake/alert Behavior During Therapy: WFL for tasks assessed/performed Overall Cognitive Status: No family/caregiver present to determine baseline cognitive functioning                                 General Comments: oriented to self only        General Comments      Exercises General Exercises - Lower Extremity Ankle Circles/Pumps: AROM;Both;5 reps Short Arc Quad: AROM;Both;10 reps;Strengthening Heel Slides: Strengthening;Both;10 reps;AROM Hip ABduction/ADduction: AROM;Strengthening;Both;10 reps   Assessment/Plan    PT Assessment Patient needs continued PT services  PT Problem List Decreased strength;Decreased range of motion;Decreased activity tolerance;Decreased balance;Decreased mobility;Decreased knowledge of precautions;Decreased coordination;Decreased cognition;Decreased knowledge of use of DME;Decreased safety awareness;Decreased skin integrity;Pain       PT Treatment Interventions DME instruction;Gait training;Stair training;Functional mobility training;Therapeutic activities;Therapeutic exercise;Balance training;Patient/family education;Cognitive remediation    PT Goals (Current goals can be found in the Care Plan section)  Acute Rehab PT  Goals PT Goal Formulation: Patient unable to participate in goal setting Time For Goal Achievement: 02/06/21 Potential to Achieve Goals: Fair    Frequency 7X/week   Barriers to discharge        Co-evaluation               AM-PAC PT "6 Clicks" Mobility  Outcome Measure Help needed turning from your back to your side while in a flat bed without using bedrails?: A Lot Help needed moving from lying on your back to sitting on the side of a flat bed without using bedrails?: A Lot Help needed moving to and from a bed to a chair (including a wheelchair)?: A Lot Help needed standing up from a chair using your arms (e.g., wheelchair or bedside chair)?: A Little Help needed to walk in hospital room?: A Lot Help needed climbing 3-5 steps with a railing? : Total 6 Click Score: 12    End of Session Equipment Utilized During Treatment: Gait belt Activity Tolerance: Other (comment) (limited due to low BP) Patient left: in bed;with call bell/phone within reach;with bed alarm set Nurse Communication: Mobility status;Other (comment) (low BP) PT Visit Diagnosis: Muscle weakness (generalized) (M62.81);Difficulty in walking, not elsewhere classified (R26.2);Pain Pain - Right/Left: Left Pain -  part of body: Hip    Time: 1421-1500 PT Time Calculation (min) (ACUTE ONLY): 39 min   Charges:   PT Evaluation $PT Eval Moderate Complexity: 1 Mod PT Treatments $Therapeutic Activity: 23-37 mins       Lieutenant Diego PT, DPT 4:13 PM,01/23/21

## 2021-01-23 NOTE — Consult Note (Addendum)
[facility] Consultation Note  Patient ID: Thomas Matthews, MRN: [id number], DOB/AGE: [birth year] 87 y.o. Admit date: 01/22/2021   Date of Consult: 01/23/2021 Primary Physician: Birdie Sons, MD Primary Cardiologist: Serafina Royals  Chief Complaint: No chief complaint on file.  Reason for Consult:  Hypotension, atrial tachycardia seen on pacemaker interrogation  HPI: 87 y.o. male with a past medical history significant for dementia, CAD, HFpEF (LVEF 50-55% by echo 11/2020), sick sinus syndrome s/p permanent pacemaker placement, chronic atrial fibrillation no longer on anticoagulation due to GI bleed requiring transfusions, status post TAVR 2019 for severe aortic stenosis, hypertension, hyperlipidemia, bilateral mild carotid artery stenosis.  He has multiple recent hospitalizations - for acute encephalopathy 11/2020 where patient had nonsustained V. tach and was started on amiodarone, hip fracture 12/2020 and hypotension 01/17/2021.   He presented to [facility] ED from rehab on 12/7 for hypotension and sepsis rule out.  Cardiology consulted by hospitalist for hypotension and atrial tachycardia seen on pacemaker interrogation today.  Patient examined at bedside sitting up comfortably in bed eating lunch.  He states the reason why he came to the hospital was he was in the bathroom and fell, however he is a rather poor historian through the remainder of the encounter.  He states he feels okay right now and denies any dizziness, chest pain, shortness of breath, or heart racing.  Review of telemetry today shows atrial fibrillation with aberrancy and 5 beats of wide complex tachycardia of uncertain clinical significance  Current labs are significant for a WBCs 11.1, H&H of 9.3/28.1 which is actually higher than 1 week ago.  Creatinine 1.16 and EGFR 58.    His most recent echo 11/2020 revealed an LVEF of 50-55 percent, moderate left atrial dilation, mild to moderate MR, aortic valve  replacement.   History reviewed. No pertinent past medical history.    Surgical History:  Past Surgical History:  Procedure Laterality Date   INTRAMEDULLARY (IM) NAIL INTERTROCHANTERIC Left 01/12/2021   Procedure: INTRAMEDULLARY (IM) NAIL INTERTROCHANTRIC;  Surgeon: Corky Mull, MD;  Location: [facility] ORS;  Service: Orthopedics;  Laterality: Left;     Home Meds: Prior to Admission medications   Medication Sig Start Date End Date Taking? Authorizing Provider  amiodarone (PACERONE) 200 MG tablet Take 1 tablet (200 mg total) by mouth 2 (two) times daily. 12/17/20  Yes [place] Donnamarie Poag, DO  calcium citrate-vitamin D (CITRACAL+D) 315-200 MG-UNIT tablet Take 2 tablets by mouth daily.   Yes [provider]  cholecalciferol (VITAMIN D3) 25 MCG (1000 UNIT) tablet Take 2,000 Units by mouth daily.   Yes [provider]  enoxaparin (LOVENOX) 30 MG/0.3ML injection Inject 0.3 mLs (30 mg total) into the skin daily. 01/15/21  Yes Lattie Corns, PA-C  ferrous sulfate 325 (65 FE) MG tablet Take 1 tablet (325 mg total) by mouth 2 (two) times daily with a meal. 12/17/20  Yes [place] Eric J, DO  furosemide (LASIX) 40 MG tablet Take 1 tablet (40 mg total) by mouth daily. 12/17/20  Yes [place] Eric J, DO  metoprolol tartrate (LOPRESSOR) 25 MG tablet Take 1 tablet (25 mg total) by mouth 2 (two) times daily. 01/20/21  Yes Sreenath, Sudheer B, MD  midodrine (PROAMATINE) 2.5 MG tablet Take 1 tablet (2.5 mg total) by mouth 3 (three) times daily with meals. 01/20/21  Yes Sreenath, Sudheer B, MD  pantoprazole (PROTONIX) 40 MG tablet Take 40 mg by mouth 2 (two) times daily.   Yes [provider]  pravastatin (  PRAVACHOL) 20 MG tablet Take 10 mg by mouth daily.   Yes [provider]  senna-docusate (SENOKOT-S) 8.6-50 MG tablet Take 1 tablet by mouth daily.   Yes [provider]  tamsulosin (FLOMAX) 0.4 MG CAPS capsule Take 0.4 mg by mouth daily after breakfast. 11/11/11  Yes  [provider]  tolterodine (DETROL LA) 4 MG 24 hr capsule Take 4 mg by mouth daily. 03/14/20  Yes [provider]    Inpatient Medications:   amiodarone  200 mg Oral BID   cholecalciferol  2,000 Units Oral Daily   fesoterodine  8 mg Oral Daily   heparin  5,000 Units Subcutaneous Q8H   midodrine  5 mg Oral TID WC   pantoprazole  40 mg Oral BID   pravastatin  10 mg Oral Daily   tamsulosin  0.4 mg Oral QPC breakfast     Allergies:  Allergies  Allergen Reactions   Amlodipine Besylate Swelling   Felodipine Swelling   Oxybutynin Chloride Other (See Comments)    Other reaction(s): Dizziness Tolerates taking at night   Simvastatin     Other reaction(s): Dizziness   Terazosin     Other reaction(s): Low blood pressure    Social History   Socioeconomic History   Marital status: Married    Spouse name: Not on file   Number of children: Not on file   Years of education: Not on file   Highest education level: Not on file  Occupational History   Not on file  Tobacco Use   Smoking status: Former   Smokeless tobacco: Never  Substance and Sexual Activity   Alcohol use: No   Drug use: No   Sexual activity: Not on file  Other Topics Concern   Not on file  Social History Narrative   Not on file   Social Determinants of Health   Financial Resource Strain: Not on file  Food Insecurity: Not on file  Transportation Needs: Not on file  Physical Activity: Not on file  Stress: Not on file  Social Connections: Not on file  Intimate Partner Violence: Not on file     History reviewed. No pertinent family history.   Review of Systems Positive for none Negative for: General:  chills, fever, night sweats or weight changes.  Cardiovascular: Chest pain PND orthopnea syncope dizziness  Dermatological skin lesions rashes Respiratory: Cough congestion Urologic: Frequent urination urination at night and hematuria Abdominal: negative for nausea, vomiting, diarrhea,  bright red blood per rectum, melena, or hematemesis Neurologic: negative for visual changes, and/or hearing changes  All other systems reviewed and are otherwise negative except as noted above.  Labs: Recent Labs    01/23/21 0524  CKTOTAL 16*   Lab Results  Component Value Date   WBC 11.1 (H) 01/23/2021   HGB 9.3 (L) 01/23/2021   HCT 28.1 (L) 01/23/2021   MCV 99.6 01/23/2021   PLT 277 01/23/2021    Recent Labs  Lab 01/22/21 1317 01/23/21 0524  NA 128*  --   K 3.5  --   CL 96*  --   CO2 22  --   BUN 19  --   CREATININE 1.15 1.16  CALCIUM 8.4*  --   PROT 5.8*  --   BILITOT 1.4*  --   ALKPHOS 67  --   ALT 12  --   AST 28  --   GLUCOSE 117*  --    Lab Results  Component Value Date   CHOL 82 01/18/2021  HDL 31 (L) 01/18/2021   LDLCALC 43 01/18/2021   TRIG 38 01/18/2021   No results found for: DDIMER  Radiology/Studies:  DG Chest 1 View  Result Date: 01/17/2021 CLINICAL DATA:  Weakness, cough, altered mental status EXAM: CHEST  1 VIEW COMPARISON:  01/12/2021 FINDINGS: Dual lead pacer noted. Aortic valve prosthesis. Mild enlargement of the cardiopericardial silhouette is observed with indistinctness of the pulmonary vasculature potentially reflecting pulmonary venous hypertension. No Kerley B lines or overt interstitial edema observed. Atherosclerotic calcification of the aortic arch. Previous blunting of the left lateral costophrenic angle is less readily apparent on today's exam although there may be some minimal blunting. The area is partially obscured by ECG leads. Reduced acromial humeral distance bilaterally, often seen in the setting of chronic rotator cuff tears. IMPRESSION: 1. Mild enlargement of the cardiopericardial silhouette with pulmonary venous hypertension but no overt edema. 2. Minimal blunting of the left lateral costophrenic angle although improved from 01/12/2021. Slightly represents a trace pleural effusion. 3.  Aortic Atherosclerosis (ICD10-I70.0). 4.  Probable bilateral chronic rotator cuff tears. Electronically Signed   By: Van Clines M.D.   On: 01/17/2021 16:50   DG Pelvis 1-2 Views  Result Date: 01/12/2021 CLINICAL DATA:  Fall with hip pain EXAM: PELVIS - 1-2 VIEW COMPARISON:  None. FINDINGS: Acute intertrochanteric left femur fracture with mild varus angulation. No evidence of pelvic ring fracture or diastasis. Generalized osteopenia and atheromatous calcification. Gas over the right groin suggesting hernia. IMPRESSION: 1. Acute intertrochanteric left femur fracture. 2. Generalized osteopenia. 3. Gas over the right groin suggesting bowel containing hernia. Electronically Signed   By: Jorje Guild M.D.   On: 01/12/2021 09:00   CT Head Wo Contrast  Result Date: 01/17/2021 CLINICAL DATA:  Neuro deficit, acute, stroke suspected Mental status change, unknown cause EXAM: CT HEAD WITHOUT CONTRAST TECHNIQUE: Contiguous axial images were obtained from the base of the skull through the vertex without intravenous contrast. COMPARISON:  CT head 01/12/2021 BRAIN: BRAIN Cerebral ventricle sizes are concordant with the degree of cerebral volume loss. Patchy and confluent areas of decreased attenuation are noted throughout the deep and periventricular white matter of the cerebral hemispheres bilaterally, compatible with chronic microvascular ischemic disease. No evidence of large-territorial acute infarction. No parenchymal hemorrhage. No mass lesion. No extra-axial collection. No mass effect or midline shift. No hydrocephalus. Basilar cisterns are patent. Vascular: No hyperdense vessel. Atherosclerotic calcifications are present within the cavernous internal carotid and vertebral arteries. Skull: No acute fracture or focal lesion. Sinuses/Orbits: Paranasal sinuses and mastoid air cells are clear. Bilateral lens replacement. Orbits are unremarkable. Other: None. IMPRESSION: No acute intracranial abnormality. Electronically Signed   By: Iven Finn  M.D.   On: 01/17/2021 16:04   CT Head Wo Contrast  Result Date: 01/12/2021 CLINICAL DATA:  Trauma, fall EXAM: CT HEAD WITHOUT CONTRAST TECHNIQUE: Contiguous axial images were obtained from the base of the skull through the vertex without intravenous contrast. COMPARISON:  12/11/2020 FINDINGS: Brain: No acute intracranial findings are seen. Cortical sulci are prominent. There is decreased density in subcortical and periventricular white matter. Vascular: Scattered arterial calcifications are seen. Skull: Unremarkable. Sinuses/Orbits: There is mucosal thickening in the ethmoid and left maxillary sinuses. Other: No significant interval changes are noted. IMPRESSION: No acute intracranial findings are seen. Atrophy. Small-vessel disease. Mild chronic sinusitis. Electronically Signed   By: Elmer Picker M.D.   On: 01/12/2021 09:51   CT Angio Chest Pulmonary Embolism (PE) W or WO Contrast  Result Date: 01/17/2021 CLINICAL DATA:  Recent syncopal episode EXAM: CT ANGIOGRAPHY CHEST WITH CONTRAST TECHNIQUE: Multidetector CT imaging of the chest was performed using the standard protocol during bolus administration of intravenous contrast. Multiplanar CT image reconstructions and MIPs were obtained to evaluate the vascular anatomy. CONTRAST:  63m OMNIPAQUE IOHEXOL 350 MG/ML SOLN COMPARISON:  Chest x-ray from earlier in the same day. FINDINGS: Cardiovascular: Atherosclerotic calcifications of the thoracic aorta are noted without evidence of aneurysmal dilatation. Changes of prior TAVR are noted. The pulmonary artery shows a normal branching pattern without intraluminal filling defect to suggest pulmonary embolism. Coronary calcifications are noted. No pericardial effusion is seen. Mild cardiomegaly is noted. Pacing device is noted. Mediastinum/Nodes: Thoracic inlet is within normal limits. No sizable hilar or mediastinal adenopathy is noted. The esophagus shows evidence of a sliding-type hiatal hernia. No other  focal esophageal abnormality is noted. Lungs/Pleura: Emphysematous changes of the lungs are noted bilaterally. Bilateral pleural effusions are noted right greater than left. Bibasilar atelectatic changes are seen as well. No sizable parenchymal nodule is noted. Upper Abdomen: Gallbladder has been surgically removed. The remainder of the visualized upper abdomen appears within normal limits. Musculoskeletal: Degenerative changes of the thoracic spine are noted. No acute rib abnormality is noted. Review of the MIP images confirms the above findings. IMPRESSION: No evidence of pulmonary emboli. Bilateral pleural effusions with bibasilar atelectatic changes. Aortic Atherosclerosis (ICD10-I70.0) and Emphysema (ICD10-J43.9). Electronically Signed   By: MInez CatalinaM.D.   On: 01/17/2021 21:03   CT Cervical Spine Wo Contrast  Result Date: 01/12/2021 CLINICAL DATA:  Trauma, fall EXAM: CT CERVICAL SPINE WITHOUT CONTRAST TECHNIQUE: Multidetector CT imaging of the cervical spine was performed without intravenous contrast. Multiplanar CT image reconstructions were also generated. COMPARISON:  12/11/2020 FINDINGS: Alignment: Alignment of posterior margins of vertebral bodies is essentially unremarkable. There is minimal anterolisthesis at C6-C7 level which has not changed significantly. Skull base and vertebrae: No recent fracture is seen. There are few small smooth marginated calcifications in the soft tissues posterior to the spinous processes of lower cervical and upper thoracic spine, possibly ligament calcification from previous injury. Degenerative changes are noted with disc space narrowing, bony spurs and facet hypertrophy at multiple levels. Soft tissues and spinal canal: There is extrinsic pressure over the ventral margin of thecal sac caused by posterior bony spurs at multiple levels, more so at C6-C7 level with mild-to-moderate spinal stenosis. Disc levels: There is encroachment of neural foramina by  uncovertebral spurs from C3-C7 levels. Upper chest: Blebs and bullae are seen in the upper lung fields, more so on the right side. Other: No significant interval changes are noted. IMPRESSION: No recent fracture is seen in the cervical spine. Cervical spondylosis with spinal stenosis and encroachment of neural foramina at multiple levels. Overall, no significant interval changes are noted since 12/11/2020. COPD. Electronically Signed   By: PElmer PickerM.D.   On: 01/12/2021 09:59   [facility]  Result Date: 01/19/2021 CLINICAL DATA:  Transient ischemic attack EXAM: BILATERAL CAROTID DUPLEX ULTRASOUND TECHNIQUE: GPearline Cablesscale imaging, color Doppler and duplex ultrasound were performed of bilateral carotid and vertebral arteries in the neck. COMPARISON:  None. FINDINGS: Criteria: Quantification of carotid stenosis is based on velocity parameters that correlate the residual internal carotid diameter with NASCET-based stenosis levels, using the diameter of the distal internal carotid lumen as the denominator for stenosis measurement. The following velocity measurements were obtained: RIGHT ICA: 75/11 cm/sec CCA: 502/4cm/sec SYSTOLIC ICA/CCA RATIO:  1.3 ECA:  134  cm/sec LEFT ICA: 82/19 cm/sec CCA: 41/3 cm/sec SYSTOLIC ICA/CCA RATIO:  1.2 ECA:  96 cm/sec RIGHT CAROTID ARTERY: Heterogeneous atherosclerotic plaque in the proximal internal carotid artery. By peak systolic velocity criteria, the estimated stenosis is less than 50%. RIGHT VERTEBRAL ARTERY:  Patent with normal antegrade flow. LEFT CAROTID ARTERY: Heterogeneous and irregular atherosclerotic plaque in the proximal internal carotid artery. By peak systolic velocity criteria, the estimated stenosis is less than 50%. LEFT VERTEBRAL ARTERY:  Patent with normal antegrade flow. IMPRESSION: 1. Mild (1-49%) stenosis proximal right internal carotid artery secondary to heterogenous atherosclerotic plaque. 2. Mild (1-49%) stenosis  proximal left internal carotid artery secondary to heterogeneous and irregular/ulcerated atherosclerotic plaque. 3. Vertebral arteries are patent with normal antegrade flow. Signed, Criselda Peaches, MD, [facility] Electronically Signed   By: Jacqulynn Cadet M.D.   On: 01/19/2021 08:44   DG Chest Portable 1 View  Result Date: 01/22/2021 CLINICAL DATA:  Shortness of breath and hypotension. EXAM: PORTABLE CHEST 1 VIEW COMPARISON:  Chest x-ray dated January 17, 2021. FINDINGS: The patient is rotated to the right. Unchanged left chest wall pacemaker. Unchanged cardiomegaly status post TAVR. Unchanged small bilateral pleural effusions. No consolidation or pneumothorax. No acute osseous abnormality. IMPRESSION: 1. Unchanged small bilateral pleural effusions. Electronically Signed   By: Titus Dubin M.D.   On: 01/22/2021 13:55   DG Chest Port 1 View  Result Date: 01/12/2021 CLINICAL DATA:  Hip pain EXAM: PORTABLE CHEST 1 VIEW COMPARISON:  12/11/2020 FINDINGS: Chronic cardiomegaly with dual-chamber pacer. Transcatheter aortic valve replacement. Trace left pleural effusion. Streaky density bilaterally which is vague and favors atelectasis or scarring. IMPRESSION: 1. Chronic cardiomegaly and trace left pleural effusion. 2. Suspect mild atelectasis or scarring. Electronically Signed   By: Jorje Guild M.D.   On: 01/12/2021 08:59   DG C-Arm 1-60 Min-No Report  Result Date: 01/12/2021 Fluoroscopy was utilized by the requesting physician.  No radiographic interpretation.   DG HIP UNILAT WITH PELVIS 2-3 VIEWS LEFT  Result Date: 01/12/2021 CLINICAL DATA:  Placement of left intramedullary nail. EXAM: DG HIP (WITH OR WITHOUT PELVIS) 2-3V LEFT COMPARISON:  Earlier same day left hip radiograph at 0823 hours FINDINGS: Fluoroscopic images were obtained intraoperatively and submitted for post operative interpretation. 6 images were obtained with  60 seconds of fluoroscopy time. Provided images demonstrate placement of left intramedullary rod with proximal lag screw and distal fixation screw. Hardware is in appropriate position. Please see the performing provider's procedural report for further detail. IMPRESSION: Intraoperative imaging for placement of left intramedullary nail. Please see performing provider's procedural report for further details. Electronically Signed   By: Ileana Roup M.D.   On: 01/12/2021 16:31   DG FEMUR MIN 2 VIEWS LEFT  Result Date: 01/12/2021 CLINICAL DATA:  Fall in bathroom with hip pain EXAM: LEFT FEMUR 2 VIEWS COMPARISON:  None. FINDINGS: Acute intertrochanteric left femur fracture with mild varus angulation. Generalized osteopenia. The mid to distal femur is intact. Atheromatous calcification. IMPRESSION: Acute intertrochanteric left femur fracture. Electronically Signed   By: Jorje Guild M.D.   On: 01/12/2021 08:56     Weights: Filed Weights   01/22/21 2326 01/23/21 0432  Weight: 63.9 kg 63.7 kg     Physical Exam: Blood pressure 133/75, pulse 91, temperature 97.8 F (36.6 C), temperature source Oral, resp. rate 20, height 5' 7"  (1.702 m), weight 63.7 kg, SpO2 99 %. Body mass index is 21.99 kg/m. General: Elderly appearing Caucasian male, in no  acute distress.  Examined sitting upright in bedside eating lunch. Head eyes ears nose throat: Normocephalic, atraumatic, sclera non-icteric. Neck: supple, no apparent masses Lungs: Normal respiratory effort.  no wheezes, no rales, no rhonchi.  Heart: Tachycardic and irregular bleeding irregular rate with normal S1 S2. no murmur gallop, no rub, jugular venous pressure is normal Abdomen: Non-distended appearing. No apparent hepatomegaly.  Extremities: Trace bilateral edema. no cyanosis, no clubbing, no ulcers  Peripheral : 2+ bilateral radial pulses,  2+ bilateral dorsal pedal pulse Neuro: Alert but disoriented. Moves all extremities spontaneously. Psych:   Responds to questions appropriately with a normal affect.    Assessment: 87 year old male with a past medical history significant for dementia, CAD, HFpEF (LVEF 50-55% by echo 11/2020), chronic atrial fibrillation no longer on anticoagulation due to GI bleed requiring transfusions, status post bioprosthetic TAVR 2019 for severe aortic stenosis, hypertension, hyperlipidemia, bilateral mild carotid artery stenosis, history of multiple recent hospitalizations. He presented to [facility] ED from rehab on 12/7 for hypotension and sepsis rule out.  Cardiology consulted by hospitalist for hypotension and atrial tachycardia seen on pacemaker interrogation today.  Plan: #Hypotension -Agree to hold patient's metoprolol and increase midodrine dose to 5 mg 3 times daily.  #Atrial tachycardia on pacemaker interrogation -Review of telemetry today shows atrial fibrillation with aberrancy and 5 beats of wide complex tachycardia of uncertain clinical significance -Patient is asymptomatic during these episodes and does not endorse any clinically significant symptoms with this rhythm. -Ordered magnesium oxide for today and recheck of mag in the morning.  #History of NSVT during prior hospitalization #Chronic A. fib not on anticoagulation due to age and history of GI bleed #s/p bioprostheticTAVR 2019 for severe aortic stenosis #History of SSS s/p permanent pacemaker placement -Continue amiodarone 200 mg twice daily  #HFpEF (LVEF 50-55% by echo 11/2020) #Hypertension -as above   #Hyperlipidemia -Continue statin therapy  Signed, [place] PA-C [facility] Cardiology 01/23/2021, 12:28 PM

## 2021-01-23 NOTE — Progress Notes (Addendum)
Patient ID: Thomas Matthews, male   DOB: [birth year], 87 y.o.   MRN: [id number] [facility] PROGRESS NOTE  Thomas Matthews EXB:[id number] DOB: [birth year] DOA: 01/22/2021 PCP: Birdie Sons, MD  HPI/Subjective: Patient hard of hearing.  States he came in with slips and falls.  He states he felt okay when standing up.  Admitted with hypotension.  Recent hip fracture repair.  Objective: Vitals:   01/23/21 0822 01/23/21 1125  BP: (!) 141/82 133/75  Pulse: 62 91  Resp:    Temp: 98.1 F (36.7 C) 97.8 F (36.6 C)  SpO2: 98% 99%    Intake/Output Summary (Last 24 hours) at 01/23/2021 1247 Last data filed at 01/22/2021 2326 Gross per 24 hour  Intake 1470 ml  Output --  Net 1470 ml   Filed Weights   01/22/21 2326 01/23/21 0432  Weight: 63.9 kg 63.7 kg    ROS: Review of Systems  Respiratory:  Negative for shortness of breath.   Cardiovascular:  Negative for chest pain.  Gastrointestinal:  Negative for abdominal pain, nausea and vomiting.  Exam: Physical Exam HENT:     Head: Normocephalic.     Mouth/Throat:     Pharynx: No oropharyngeal exudate.  Eyes:     General: Lids are normal.     Conjunctiva/sclera: Conjunctivae normal.  Cardiovascular:     Rate and Rhythm: Normal rate and regular rhythm.     Heart sounds: Normal heart sounds, S1 normal and S2 normal.  Pulmonary:     Breath sounds: No decreased breath sounds, wheezing, rhonchi or rales.  Abdominal:     Palpations: Abdomen is soft.     Comments: Fullness left lower quadrant  Musculoskeletal:     Right lower leg: No swelling.     Left lower leg: No swelling.  Skin:    General: Skin is warm.     Findings: No rash.  Neurological:     Mental Status: He is alert and oriented to person, place, and time.     Comments: Hard of hearing      Scheduled Meds:  amiodarone  200 mg Oral BID   cholecalciferol  2,000 Units Oral Daily   fesoterodine  8 mg Oral Daily   heparin  5,000 Units Subcutaneous Q8H    midodrine  5 mg Oral TID WC   pantoprazole  40 mg Oral BID   pravastatin  10 mg Oral Daily   tamsulosin  0.4 mg Oral QPC breakfast    Assessment/Plan:  Orthostatic hypotension.  Came in hypotensive.  Will increase midodrine to 5 mg 3 times daily.  Continue to check orthostatic vitals. Atrial tachycardia seen on interrogation of pacemaker.  On amiodarone.  With orthostatic hypotension need to hold metoprolol currently. Recent left hip fracture.  Physical therapy evaluation. Chronic atrial fibrillation on amiodarone for rate control. Chronic diastolic congestive heart failure holding diuretic with orthostatic hypotension History of CAD holding metoprolol with hypotension.  On pravastatin BPH on Flomax.  May need to get rid of this medication also if still remains orthostatic. Weakness.  Physical therapy evaluation Left lower quadrant fullness.  Patient does have history of hernia.  Reevaluate tomorrow. Hyponatremia.  Recheck labs tomorrow Chronic kidney disease stage IIIa.  Holding diuretic Lactic acid on presentation.  Will discontinue antibiotics since it does not seem like he has infection at this point Anemia, iron deficiency     Code Status:     Code Status Orders  (From admission, onward)  Start     Ordered   01/22/21 2015  Full code  Continuous        01/22/21 2016           Code Status History     Date Active Date Inactive Code Status Order ID Comments User Context   01/17/2021 2035 01/20/2021 2202 DNR [id number]  Athena Masse, MD ED   01/12/2021 1029 01/15/2021 1814 DNR [id number]  Collier Bullock, MD ED   12/12/2020 1440 12/17/2020 1856 DNR [id number]  [place] Eric J, DO ED   12/11/2020 1637 12/12/2020 1440 Full Code [id number]  Karie Kirks, DO ED      Family Communication: Spoke with daughter on the phone Disposition Plan: Status is: Inpatient  Consultants: Cardiology  Time spent: 28 minutes  [place]

## 2021-01-23 NOTE — Progress Notes (Signed)
Pacemaker interrogated. Results in paper chart.

## 2021-01-23 NOTE — Progress Notes (Signed)
TOC consulted for heart failure screen, CSW has informed heart failure RN Jimsey.   [place], [place]

## 2021-01-23 NOTE — TOC Initial Note (Signed)
Transition of Care [facility]) - Initial/Assessment Note    Patient Details  Name: Thomas Matthews MRN: [id number] Date of Birth: 1923-11-25  Transition of [facility]) CM/SW Contact:    Alberteen Sam, LCSW Phone Number: 01/23/2021, 3:46 PM  Clinical Narrative:                  CSW spoke with patient's daughter Neoma Laming she reports plan is for patient to discharge back to [facility] when medically ready.   TOC will continue to follow for medical readiness.   Expected Discharge Plan: Skilled Nursing Facility Barriers to Discharge: Continued Medical Work up   Patient Goals and CMS Choice Patient states their goals for this hospitalization and ongoing recovery are:: to go ot snf CMS Medicare.gov Compare Post Acute Care list provided to:: Patient Represenative (must comment) (daughter Neoma Laming)    Expected Discharge Plan and Services Expected Discharge Plan: [place] arrangements for the past 2 months: [place]                                      Prior Living Arrangements/Services Living arrangements for the past 2 months: [place] Lives with:: Self                   Activities of Daily Living      Permission Sought/Granted                  Emotional Assessment         Alcohol / Substance Use: Not Applicable Psych Involvement: No (comment)  Admission diagnosis:  Hypotension [I95.9] Hypotension, unspecified hypotension type [I95.9] Patient Active Problem List   Diagnosis Date Noted   Atrial tachycardia ([place])    Hypotension 01/22/2021   Hypokalemia 04/88/8916   Acute metabolic encephalopathy 94/50/3888   H/O sick sinus syndrome 01/13/2021   BPH (benign prostatic hyperplasia) 01/13/2021   Femur fracture, left ([place]) 01/12/2021   Atrial fibrillation, chronic ([place]) 01/12/2021   Chronic diastolic CHF (congestive heart failure) ([place]) 01/12/2021   COPD (chronic obstructive pulmonary  disease) ([place]) 01/12/2021   Protein-calorie malnutrition, severe 12/14/2020   Pressure injury of skin 12/13/2020   NSVT (nonsustained ventricular tachycardia) 12/11/2020   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 [address] Phone: [phone] Fax: [place], [place] - [address] [place] [address] Phone: [phone] Fax: [phone]     Social Determinants of Health (SDOH) Interventions    Readmission Risk Interventions No flowsheet data found.

## 2021-01-24 DIAGNOSIS — R338 Other retention of urine: Secondary | ICD-10-CM | POA: Diagnosis not present

## 2021-01-24 DIAGNOSIS — I471 Supraventricular tachycardia: Secondary | ICD-10-CM | POA: Diagnosis not present

## 2021-01-24 DIAGNOSIS — I951 Orthostatic hypotension: Secondary | ICD-10-CM | POA: Diagnosis not present

## 2021-01-24 DIAGNOSIS — J449 Chronic obstructive pulmonary disease, unspecified: Secondary | ICD-10-CM

## 2021-01-24 DIAGNOSIS — I482 Chronic atrial fibrillation, unspecified: Secondary | ICD-10-CM | POA: Diagnosis not present

## 2021-01-24 LAB — CBC
HCT: 27.3 % — ABNORMAL LOW (ref 39.0–52.0)
Hemoglobin: 9.3 g/dL — ABNORMAL LOW (ref 13.0–17.0)
MCH: 33.5 pg (ref 26.0–34.0)
MCHC: 34.1 g/dL (ref 30.0–36.0)
MCV: 98.2 fL (ref 80.0–100.0)
Platelets: 308 10*3/uL (ref 150–400)
RBC: 2.78 MIL/uL — ABNORMAL LOW (ref 4.22–5.81)
RDW: 17.2 % — ABNORMAL HIGH (ref 11.5–15.5)
WBC: 12.3 10*3/uL — ABNORMAL HIGH (ref 4.0–10.5)
nRBC: 0 % (ref 0.0–0.2)

## 2021-01-24 LAB — BASIC METABOLIC PANEL
Anion gap: 11 (ref 5–15)
BUN: 27 mg/dL — ABNORMAL HIGH (ref 8–23)
CO2: 23 mmol/L (ref 22–32)
Calcium: 8 mg/dL — ABNORMAL LOW (ref 8.9–10.3)
Chloride: 98 mmol/L (ref 98–111)
Creatinine, Ser: 1.33 mg/dL — ABNORMAL HIGH (ref 0.61–1.24)
GFR, Estimated: 49 mL/min — ABNORMAL LOW (ref >60)
Glucose, Bld: 103 mg/dL — ABNORMAL HIGH (ref 70–99)
Potassium: 3.3 mmol/L — ABNORMAL LOW (ref 3.5–5.1)
Sodium: 132 mmol/L — ABNORMAL LOW (ref 135–145)

## 2021-01-24 LAB — HEMOGLOBIN A1C
Hgb A1c MFr Bld: 5.4 % (ref 4.8–5.6)
Mean Plasma Glucose: 108 mg/dL

## 2021-01-24 LAB — MAGNESIUM: Magnesium: 2 mg/dL (ref 1.7–2.4)

## 2021-01-24 MED ORDER — MIDODRINE HCL 5 MG PO TABS
10.0000 mg | ORAL_TABLET | Freq: Three times a day (TID) | ORAL | Status: DC
  Administered 2021-01-24 – 2021-01-28 (×12): 10 mg via ORAL
  Filled 2021-01-24 (×12): qty 2

## 2021-01-24 MED ORDER — POTASSIUM CHLORIDE CRYS ER 20 MEQ PO TBCR
40.0000 meq | EXTENDED_RELEASE_TABLET | Freq: Once | ORAL | Status: AC
  Administered 2021-01-24: 40 meq via ORAL
  Filled 2021-01-24: qty 2

## 2021-01-24 MED ORDER — FINASTERIDE 5 MG PO TABS
5.0000 mg | ORAL_TABLET | Freq: Every day | ORAL | Status: DC
  Administered 2021-01-24 – 2021-01-28 (×5): 5 mg via ORAL
  Filled 2021-01-24 (×5): qty 1

## 2021-01-24 MED ORDER — SODIUM CHLORIDE 0.9 % IV BOLUS
500.0000 mL | Freq: Once | INTRAVENOUS | Status: AC
  Administered 2021-01-24: 500 mL via INTRAVENOUS

## 2021-01-24 NOTE — Progress Notes (Signed)
Physical Therapy Treatment Patient Details Name: Thomas Matthews MRN: [id number] DOB: September 27, 1923 Today's Date: 01/24/2021   History of Present Illness Pt is a 87 yo man that presented to ED from rehab due to low BP. Recently discharged from hospital s/p hip ORIF 11/27 ([facility]). PMH of dementia, CHF, COPD, pacemaker, HTN,    PT Comments    Patient alert, family at bedside, CNA in room to assess and perform orthostatics. The patient remained limited today in progress due to low BP. maxA for bed mobility, fair sitting balance, one standing attempt with minA and a few lateral steps towards HOB. Unable to obtain a reading in standing, but BP in sitting twice was 70s/40s. Returned to supine and repositioned with all needs in reach. The patient would benefit from further skilled PT intervention to continue to progress towards goals. Recommendation remains appropriate.       Recommendations for follow up therapy are one component of a multi-disciplinary discharge planning process, led by the attending physician.  Recommendations may be updated based on patient status, additional functional criteria and insurance authorization.  Follow Up Recommendations  Skilled nursing-short term rehab (<3 hours/day)     Assistance Recommended at Discharge Frequent or constant Supervision/Assistance  Equipment Recommendations  Other (comment) (TBD)    Recommendations for Other Services       Precautions / Restrictions Precautions Precautions: Fall Precaution Comments: orthostatic Restrictions Weight Bearing Restrictions: Yes LLE Weight Bearing: Weight bearing as tolerated     Mobility  Bed Mobility Overal bed mobility: Needs Assistance Bed Mobility: Supine to Sit;Sit to Supine     Supine to sit: Max assist;HOB elevated Sit to supine: Max assist   General bed mobility comments: increased time    Transfers Overall transfer level: Needs assistance Equipment used: Rolling walker (2  wheels) Transfers: Sit to/from Stand Sit to Stand: Min assist                Ambulation/Gait         Gait velocity: decreased     General Gait Details: unable to at this time due to low BP. able to take a few steps to the R at EOB with modA   Stairs             Wheelchair Mobility    Modified Rankin (Stroke Patients Only)       Balance Overall balance assessment: Needs assistance Sitting-balance support: No upper extremity supported;Feet supported Sitting balance-Leahy Scale: Fair Sitting balance - Comments: close sup with bilat UE support; limited movement outside immediate BOS   Standing balance support: Bilateral upper extremity supported Standing balance-Leahy Scale: Poor                              Cognition Arousal/Alertness: Awake/alert Behavior During Therapy: WFL for tasks assessed/performed                                   General Comments: pt stated he is not back to himself yet        Exercises      General Comments        Pertinent Vitals/Pain Pain Assessment: Faces Faces Pain Scale: Hurts a little bit Pain Location: L hip with bed mobility Pain Descriptors / Indicators: Aching;Grimacing;Guarding Pain Intervention(s): Limited activity within patient's tolerance;Monitored during session;Repositioned    Home Living  Prior Function            PT Goals (current goals can now be found in the care plan section) Progress towards PT goals: Progressing toward goals    Frequency    7X/week      PT Plan Current plan remains appropriate    Co-evaluation              AM-PAC PT "6 Clicks" Mobility   Outcome Measure  Help needed turning from your back to your side while in a flat bed without using bedrails?: A Lot Help needed moving from lying on your back to sitting on the side of a flat bed without using bedrails?: A Lot Help needed moving to and from a  bed to a chair (including a wheelchair)?: A Lot Help needed standing up from a chair using your arms (e.g., wheelchair or bedside chair)?: A Little Help needed to walk in hospital room?: A Lot Help needed climbing 3-5 steps with a railing? : Total 6 Click Score: 12    End of Session Equipment Utilized During Treatment: Gait belt Activity Tolerance: Other (comment);Treatment limited secondary to medical complications (Comment) (limited by orthostatic hypotension) Patient left: in bed;with call bell/phone within reach;with bed alarm set   PT Visit Diagnosis: Muscle weakness (generalized) (M62.81);Difficulty in walking, not elsewhere classified (R26.2);Pain Pain - Right/Left: Left Pain - part of body: Hip     Time: 6389-3734 PT Time Calculation (min) (ACUTE ONLY): 38 min  Charges:  $Therapeutic Activity: 23-37 mins                    Lieutenant Diego PT, DPT 1:32 PM,01/24/21

## 2021-01-24 NOTE — Progress Notes (Signed)
[facility] Card[facility] Encounter Note  Patient: Thomas Matthews / Admit Date: 01/22/2021 / Date of Encounter: 01/24/2021, 10:32 AM   Subjective: Patient examined sitting up and bed today with daughter at bedside.  He endorses no complaints and denies any chest pain, shortness of breath, palpitations, dizziness.   Review of Systems: Positive for: None Negative for: Vision change, hearing change, syncope, dizziness, nausea, vomiting,diarrhea, bloody stool, stomach pain, cough, congestion, diaphoresis, urinary frequency, urinary pain,skin lesions, skin rashes Others previously listed  Objective: Telemetry: Atrial fibrillation with ventricular rate of 84 bpm.  Physical Exam: Blood pressure (!) 95/48, pulse 90, temperature 97.9 F (36.6 C), resp. rate 18, height 5\' 7"  (1.702 m), weight 64.4 kg, SpO2 99 %. Body mass index is 22.24 kg/m. General: Elderly appearing Caucasian male, in no acute distress.  Examined sitting upright in bedside with daughter present during interview. Head eyes ears nose throat: Normocephalic, atraumatic, sclera non-icteric. Neck: supple, no apparent masses Lungs: Normal respiratory effort.  no wheezes, no rales, no rhonchi.  Heart: Irregularly irregular rate with normal S1 S2. no murmur gallop, no rub, jugular venous pressure is normal Abdomen: Non-distended appearing. No apparent hepatomegaly.  Extremities: Trace left lower extremity edema.  Right lower extremity without edema.  No cyanosis, no clubbing, no ulcers  Peripheral : 2+ bilateral radial pulses,  2+ bilateral dorsal pedal pulse Neuro: Alert but disoriented. Moves all extremities spontaneously. Psych:  Responds to questions appropriately with a normal affect.   Intake/Output Summary (Last 24 hours) at 01/24/2021 1032 Last data filed at 01/24/2021 1009 Gross per 24 hour  Intake 480 ml  Output 1350 ml  Net -870 ml    Inpatient Medications:   amiodarone  200 mg Oral BID   cholecalciferol   2,000 Units Oral Daily   heparin  5,000 Units Subcutaneous Q8H   midodrine  5 mg Oral TID WC   pantoprazole  40 mg Oral BID   pravastatin  10 mg Oral Daily   tamsulosin  0.4 mg Oral QPC breakfast   Infusions:   Labs: Recent Labs    01/22/21 1317 01/23/21 0524 01/24/21 0624  NA 128*  --  132*  K 3.5  --  3.3*  CL 96*  --  98  CO2 22  --  23  GLUCOSE 117*  --  103*  BUN 19  --  27*  CREATININE 1.15 1.16 1.33*  CALCIUM 8.4*  --  8.0*  MG  --   --  2.0   Recent Labs    01/22/21 1317  AST 28  ALT 12  ALKPHOS 67  BILITOT 1.4*  PROT 5.8*  ALBUMIN 2.8*   Recent Labs    01/22/21 1317 01/23/21 0524 01/24/21 0624  WBC 8.2 11.1* 12.3*  NEUTROABS 6.8  --   --   HGB 9.0* 9.3* 9.3*  HCT 27.5* 28.1* 27.3*  MCV 102.2* 99.6 98.2  PLT 263 277 308   Recent Labs    01/23/21 0524  CKTOTAL 16*   Invalid input(s): POCBNP Recent Labs    01/23/21 0524  HGBA1C 5.4     Weights: Filed Weights   01/22/21 2326 01/23/21 0432 01/24/21 0433  Weight: 63.9 kg 63.7 kg 64.4 kg     Radiology/Studies:  DG Chest 1 View  Result Date: 01/17/2021 CLINICAL DATA:  Weakness, cough, altered mental status EXAM: CHEST  1 VIEW COMPARISON:  01/12/2021 FINDINGS: Dual lead pacer noted. Aortic valve prosthesis. Mild enlargement of the cardiopericardial silhouette is observed with indistinctness  of the pulmonary vasculature potentially reflecting pulmonary venous hypertension. No Kerley B lines or overt interstitial edema observed. Atherosclerotic calcification of the aortic arch. Previous blunting of the left lateral costophrenic angle is less readily apparent on today's exam although there may be some minimal blunting. The area is partially obscured by ECG leads. Reduced acromial humeral distance bilaterally, often seen in the setting of chronic rotator cuff tears. IMPRESSION: 1. Mild enlargement of the cardiopericardial silhouette with pulmonary venous hypertension but no overt edema. 2. Minimal  blunting of the left lateral costophrenic angle although improved from 01/12/2021. Slightly represents a trace pleural effusion. 3.  Aortic Atherosclerosis (ICD10-I70.0). 4. Probable bilateral chronic rotator cuff tears. Electronically Signed   By: Van Clines M.D.   On: 01/17/2021 16:50   DG Pelvis 1-2 Views  Result Date: 01/12/2021 CLINICAL DATA:  Fall with hip pain EXAM: PELVIS - 1-2 VIEW COMPARISON:  None. FINDINGS: Acute intertrochanteric left femur fracture with mild varus angulation. No evidence of pelvic ring fracture or diastasis. Generalized osteopenia and atheromatous calcification. Gas over the right groin suggesting hernia. IMPRESSION: 1. Acute intertrochanteric left femur fracture. 2. Generalized osteopenia. 3. Gas over the right groin suggesting bowel containing hernia. Electronically Signed   By: Jorje Guild M.D.   On: 01/12/2021 09:00   CT Head Wo Contrast  Result Date: 01/17/2021 CLINICAL DATA:  Neuro deficit, acute, stroke suspected Mental status change, unknown cause EXAM: CT HEAD WITHOUT CONTRAST TECHNIQUE: Contiguous axial images were obtained from the base of the skull through the vertex without intravenous contrast. COMPARISON:  CT head 01/12/2021 BRAIN: BRAIN Cerebral ventricle sizes are concordant with the degree of cerebral volume loss. Patchy and confluent areas of decreased attenuation are noted throughout the deep and periventricular white matter of the cerebral hemispheres bilaterally, compatible with chronic microvascular ischemic disease. No evidence of large-territorial acute infarction. No parenchymal hemorrhage. No mass lesion. No extra-axial collection. No mass effect or midline shift. No hydrocephalus. Basilar cisterns are patent. Vascular: No hyperdense vessel. Atherosclerotic calcifications are present within the cavernous internal carotid and vertebral arteries. Skull: No acute fracture or focal lesion. Sinuses/Orbits: Paranasal sinuses and mastoid air  cells are clear. Bilateral lens replacement. Orbits are unremarkable. Other: None. IMPRESSION: No acute intracranial abnormality. Electronically Signed   By: Iven Finn M.D.   On: 01/17/2021 16:04   CT Head Wo Contrast  Result Date: 01/12/2021 CLINICAL DATA:  Trauma, fall EXAM: CT HEAD WITHOUT CONTRAST TECHNIQUE: Contiguous axial images were obtained from the base of the skull through the vertex without intravenous contrast. COMPARISON:  12/11/2020 FINDINGS: Brain: No acute intracranial findings are seen. Cortical sulci are prominent. There is decreased density in subcortical and periventricular white matter. Vascular: Scattered arterial calcifications are seen. Skull: Unremarkable. Sinuses/Orbits: There is mucosal thickening in the ethmoid and left maxillary sinuses. Other: No significant interval changes are noted. IMPRESSION: No acute intracranial findings are seen. Atrophy. Small-vessel disease. Mild chronic sinusitis. Electronically Signed   By: Elmer Picker M.D.   On: 01/12/2021 09:51   CT Angio Chest Pulmonary Embolism (PE) W or WO Contrast  Result Date: 01/17/2021 CLINICAL DATA:  Recent syncopal episode EXAM: CT ANGIOGRAPHY CHEST WITH CONTRAST TECHNIQUE: Multidetector CT imaging of the chest was performed using the standard protocol during bolus administration of intravenous contrast. Multiplanar CT image reconstructions and MIPs were obtained to evaluate the vascular anatomy. CONTRAST:  52mL OMNIPAQUE IOHEXOL 350 MG/ML SOLN COMPARISON:  Chest x-ray from earlier in the same day. FINDINGS: Cardiovascular: Atherosclerotic calcifications of the  thoracic aorta are noted without evidence of aneurysmal dilatation. Changes of prior TAVR are noted. The pulmonary artery shows a normal branching pattern without intraluminal filling defect to suggest pulmonary embolism. Coronary calcifications are noted. No pericardial effusion is seen. Mild cardiomegaly is noted. Pacing device is noted.  Mediastinum/Nodes: Thoracic inlet is within normal limits. No sizable hilar or mediastinal adenopathy is noted. The esophagus shows evidence of a sliding-type hiatal hernia. No other focal esophageal abnormality is noted. Lungs/Pleura: Emphysematous changes of the lungs are noted bilaterally. Bilateral pleural effusions are noted right greater than left. Bibasilar atelectatic changes are seen as well. No sizable parenchymal nodule is noted. Upper Abdomen: Gallbladder has been surgically removed. The remainder of the visualized upper abdomen appears within normal limits. Musculoskeletal: Degenerative changes of the thoracic spine are noted. No acute rib abnormality is noted. Review of the MIP images confirms the above findings. IMPRESSION: No evidence of pulmonary emboli. Bilateral pleural effusions with bibasilar atelectatic changes. Aortic Atherosclerosis (ICD10-I70.0) and Emphysema (ICD10-J43.9). Electronically Signed   By: Inez Catalina M.D.   On: 01/17/2021 21:03   CT Cervical Spine Wo Contrast  Result Date: 01/12/2021 CLINICAL DATA:  Trauma, fall EXAM: CT CERVICAL SPINE WITHOUT CONTRAST TECHNIQUE: Multidetector CT imaging of the cervical spine was performed without intravenous contrast. Multiplanar CT image reconstructions were also generated. COMPARISON:  12/11/2020 FINDINGS: Alignment: Alignment of posterior margins of vertebral bodies is essentially unremarkable. There is minimal anterolisthesis at C6-C7 level which has not changed significantly. Skull base and vertebrae: No recent fracture is seen. There are few small smooth marginated calcifications in the soft tissues posterior to the spinous processes of lower cervical and upper thoracic spine, possibly ligament calcification from previous injury. Degenerative changes are noted with disc space narrowing, bony spurs and facet hypertrophy at multiple levels. Soft tissues and spinal canal: There is extrinsic pressure over the ventral margin of thecal  sac caused by posterior bony spurs at multiple levels, more so at C6-C7 level with mild-to-moderate spinal stenosis. Disc levels: There is encroachment of neural foramina by uncovertebral spurs from C3-C7 levels. Upper chest: Blebs and bullae are seen in the upper lung fields, more so on the right side. Other: No significant interval changes are noted. IMPRESSION: No recent fracture is seen in the cervical spine. Cervical spondylosis with spinal stenosis and encroachment of neural foramina at multiple levels. Overall, no significant interval changes are noted since 12/11/2020. COPD. Electronically Signed   By: Elmer Picker M.D.   On: 01/12/2021 09:59   US Carotid Bilateral (at [facility] and [facility] only)  Result Date: 01/19/2021 CLINICAL DATA:  Transient ischemic attack EXAM: BILATERAL CAROTID DUPLEX ULTRASOUND TECHNIQUE: Pearline Cables scale imaging, color Doppler and duplex ultrasound were performed of bilateral carotid and vertebral arteries in the neck. COMPARISON:  None. FINDINGS: Criteria: Quantification of carotid stenosis is based on velocity parameters that correlate the residual internal carotid diameter with NASCET-based stenosis levels, using the diameter of the distal internal carotid lumen as the denominator for stenosis measurement. The following velocity measurements were obtained: RIGHT ICA: 75/11 cm/sec CCA: 33/2 cm/sec SYSTOLIC ICA/CCA RATIO:  1.3 ECA:  134 cm/sec LEFT ICA: 82/19 cm/sec CCA: 95/1 cm/sec SYSTOLIC ICA/CCA RATIO:  1.2 ECA:  96 cm/sec RIGHT CAROTID ARTERY: Heterogeneous atherosclerotic plaque in the proximal internal carotid artery. By peak systolic velocity criteria, the estimated stenosis is less than 50%. RIGHT VERTEBRAL ARTERY:  Patent with normal antegrade flow. LEFT CAROTID ARTERY: Heterogeneous and irregular atherosclerotic plaque in the proximal internal carotid artery. By  peak systolic velocity criteria, the estimated stenosis is less than 50%. LEFT VERTEBRAL ARTERY:  Patent with  normal antegrade flow. IMPRESSION: 1. Mild (1-49%) stenosis proximal right internal carotid artery secondary to heterogenous atherosclerotic plaque. 2. Mild (1-49%) stenosis proximal left internal carotid artery secondary to heterogeneous and irregular/ulcerated atherosclerotic plaque. 3. Vertebral arteries are patent with normal antegrade flow. Signed, Criselda Peaches, MD, [facility] Electronically Signed   By: Jacqulynn Cadet M.D.   On: 01/19/2021 08:44   DG Chest Portable 1 View  Result Date: 01/22/2021 CLINICAL DATA:  Shortness of breath and hypotension. EXAM: PORTABLE CHEST 1 VIEW COMPARISON:  Chest x-ray dated January 17, 2021. FINDINGS: The patient is rotated to the right. Unchanged left chest wall pacemaker. Unchanged cardiomegaly status post TAVR. Unchanged small bilateral pleural effusions. No consolidation or pneumothorax. No acute osseous abnormality. IMPRESSION: 1. Unchanged small bilateral pleural effusions. Electronically Signed   By: Titus Dubin M.D.   On: 01/22/2021 13:55   DG Chest Port 1 View  Result Date: 01/12/2021 CLINICAL DATA:  Hip pain EXAM: PORTABLE CHEST 1 VIEW COMPARISON:  12/11/2020 FINDINGS: Chronic cardiomegaly with dual-chamber pacer. Transcatheter aortic valve replacement. Trace left pleural effusion. Streaky density bilaterally which is vague and favors atelectasis or scarring. IMPRESSION: 1. Chronic cardiomegaly and trace left pleural effusion. 2. Suspect mild atelectasis or scarring. Electronically Signed   By: Jorje Guild M.D.   On: 01/12/2021 08:59   DG C-Arm 1-60 Min-No Report  Result Date: 01/12/2021 Fluoroscopy was utilized by the requesting physician.  No radiographic interpretation.   DG HIP UNILAT WITH PELVIS 2-3 VIEWS LEFT  Result Date: 01/12/2021 CLINICAL DATA:  Placement of left intramedullary nail. EXAM: DG HIP (WITH OR WITHOUT PELVIS) 2-3V LEFT COMPARISON:  Earlier same  day left hip radiograph at 0823 hours FINDINGS: Fluoroscopic images were obtained intraoperatively and submitted for post operative interpretation. 6 images were obtained with 60 seconds of fluoroscopy time. Provided images demonstrate placement of left intramedullary rod with proximal lag screw and distal fixation screw. Hardware is in appropriate position. Please see the performing provider's procedural report for further detail. IMPRESSION: Intraoperative imaging for placement of left intramedullary nail. Please see performing provider's procedural report for further details. Electronically Signed   By: Ileana Roup M.D.   On: 01/12/2021 16:31   DG FEMUR MIN 2 VIEWS LEFT  Result Date: 01/12/2021 CLINICAL DATA:  Fall in bathroom with hip pain EXAM: LEFT FEMUR 2 VIEWS COMPARISON:  None. FINDINGS: Acute intertrochanteric left femur fracture with mild varus angulation. Generalized osteopenia. The mid to distal femur is intact. Atheromatous calcification. IMPRESSION: Acute intertrochanteric left femur fracture. Electronically Signed   By: Jorje Guild M.D.   On: 01/12/2021 08:56     Assessment and Recommendation  87 y.o. male with a past medical history significant for dementia, CAD, HFpEF (LVEF 50-55% by echo 11/2020), chronic atrial fibrillation no longer on anticoagulation due to GI bleed requiring transfusions, status post bioprosthetic TAVR 2019 for severe aortic stenosis, hypertension, hyperlipidemia, bilateral mild carotid artery stenosis, history of multiple recent hospitalizations. He presented to [facility] ED from rehab on 12/7 for hypotension and sepsis rule out.  Cardiology consulted by hospitalist for hypotension and atrial tachycardia seen on pacemaker interrogation yesterday.  #Hypotension -Continue holding metoprolol and continue midodrine dose to 5 mg 3 times daily.   #Atrial tachycardia on pacemaker interrogation -Review of telemetry today looks relatively unchanged from yesterday atrial  fibrillation with ventricular rate of 84 bpm  with short runs of wide complex tachycardia of uncertain clinical significance -Patient continues to be asymptomatic during these episodes and does not endorse any clinically significant symptoms with this rhythm. -Magnesium checked today at 2.  No further repletion is necessary at this time but continue to monitor.   #History of NSVT during prior hospitalization #Chronic A. fib not on anticoagulation due to age and history of GI bleed #s/p bioprostheticTAVR 2019 for severe aortic stenosis #History of SSS s/p permanent pacemaker placement -Continue amiodarone 200 mg twice daily   #HFpEF (LVEF 50-55% by echo 11/2020) #Hypertension -as above    #Hyperlipidemia -Continue statin therapy  Thank you for this consult. Dr. Isaias Cowman will be available for any further questions over the weekend.  Signed, Orinda Kenner, PA-C

## 2021-01-24 NOTE — Plan of Care (Signed)
  Problem: Fluid Volume: Goal: Hemodynamic stability will improve Outcome: Progressing   Problem: Clinical Measurements: Goal: Diagnostic test results will improve Outcome: Progressing Goal: Signs and symptoms of infection will decrease Outcome: Progressing   Problem: Respiratory: Goal: Ability to maintain adequate ventilation will improve Outcome: Progressing   Problem: Education: Goal: Knowledge of General Education information will improve Description: Including pain rating scale, medication(s)/side effects and non-pharmacologic comfort measures Outcome: Progressing   Problem: Clinical Measurements: Goal: Ability to maintain clinical measurements within normal limits will improve Outcome: Progressing Goal: Will remain free from infection Outcome: Progressing Goal: Diagnostic test results will improve Outcome: Progressing Goal: Respiratory complications will improve Outcome: Progressing Goal: Cardiovascular complication will be avoided Outcome: Progressing   Problem: Nutrition: Goal: Adequate nutrition will be maintained Outcome: Progressing   Problem: Elimination: Goal: Will not experience complications related to bowel motility Outcome: Progressing Goal: Will not experience complications related to urinary retention Outcome: Progressing   Problem: Safety: Goal: Ability to remain free from injury will improve Outcome: Progressing   Problem: Skin Integrity: Goal: Risk for impaired skin integrity will decrease Outcome: Progressing

## 2021-01-24 NOTE — Care Management Important Message (Signed)
Important Message  Patient Details  Name: Thomas Matthews MRN: [id number] Date of Birth: Oct 11, 1923   Medicare Important Message Given:  N/A - LOS <3 / Initial given by admissions     [name] 01/24/2021, 11:14 AM

## 2021-01-24 NOTE — Progress Notes (Signed)
Patient underwent left reduction and internal fixation of displaced intertrochanteric hip fracture.  Surgery was performed by Dr. Roland Rack on 01/12/21.  Patient was to follow-up in the office today for staple removal but has been re-admitted to the hospital.  Skin examination of the left leg reveals well healing surgical incisions without evidence for infection.  Staples were removed today without complication.  Benzoin and steri-strips were applied.  Patient can shower and get the incisions wet at this time.  Patient can follow-up in one month with [facility] orthopaedics for x-rays of the left femur.    Raquel Janiyha Montufar, PA-C, CAQ-OS [place]

## 2021-01-24 NOTE — Progress Notes (Signed)
Patient ID: Thomas Matthews, male   DOB: November 19, 1923, 87 y.o.   MRN: [id number] [facility] PROGRESS NOTE  Thomas Matthews TMH:[id number] DOB: 01-Nov-1923 DOA: 01/22/2021 PCP: Birdie Sons, MD  HPI/Subjective: Patient feels okay this morning.  Admitted with orthostatic hypotension.  Patient was orthostatic yesterday.  No chest pain or shortness of breath.  Had a little abdominal pain with palpating today.  Objective: Vitals:   01/24/21 1102 01/24/21 1106  BP: 102/85 (!) 99/54  Pulse: 70 97  Resp:    Temp:    SpO2: (!) 85%     Intake/Output Summary (Last 24 hours) at 01/24/2021 1313 Last data filed at 01/24/2021 1009 Gross per 24 hour  Intake 240 ml  Output 1350 ml  Net -1110 ml   Filed Weights   01/22/21 2326 01/23/21 0432 01/24/21 0433  Weight: 63.9 kg 63.7 kg 64.4 kg    ROS: Review of Systems  Respiratory:  Negative for shortness of breath.   Cardiovascular:  Negative for chest pain.  Gastrointestinal:  Positive for abdominal pain. Negative for nausea and vomiting.  Exam: Physical Exam HENT:     Head: Normocephalic.     Mouth/Throat:     Pharynx: No oropharyngeal exudate.  Eyes:     General: Lids are normal.     Conjunctiva/sclera: Conjunctivae normal.  Cardiovascular:     Rate and Rhythm: Normal rate and regular rhythm.     Heart sounds: Normal heart sounds, S1 normal and S2 normal.  Pulmonary:     Breath sounds: Examination of the right-lower field reveals decreased breath sounds. Examination of the left-lower field reveals decreased breath sounds. Decreased breath sounds present. No wheezing, rhonchi or rales.  Abdominal:     Palpations: Abdomen is soft.     Tenderness: There is abdominal tenderness in the suprapubic area.  Musculoskeletal:     Right lower leg: No swelling.     Left lower leg: No swelling.  Skin:    General: Skin is warm.     Findings: No rash.  Neurological:     Mental Status: He is alert and oriented to person, place, and time.       Scheduled Meds:  amiodarone  200 mg Oral BID   cholecalciferol  2,000 Units Oral Daily   heparin  5,000 Units Subcutaneous Q8H   midodrine  10 mg Oral TID WC   pantoprazole  40 mg Oral BID   pravastatin  10 mg Oral Daily   tamsulosin  0.4 mg Oral QPC breakfast    Assessment/Plan:  Orthostatic hypotension.  Given fluid bolus this morning.  Blood pressure on the lower side but not orthostatic after bolus.  Patient orthostatic yesterday.  Increase midodrine to 10 mg 3 times daily. Acute urinary retention requiring in and out catheterization today.  Discontinue Toviaz.  Continue Flomax and add Proscar. Atrial tachycardia seen on interrogation of pacer.  On amiodarone.  With orthostatic hypotension need to hold metoprolol currently. Chronic atrial fibrillation on amiodarone for rate control. Chronic diastolic congestive heart failure.  Holding diuretics with orthostatic hypotension Recent left hip fracture.  Continue physical therapy evaluation.  Patient will require rehab again. Left lower quadrant and epigastric fullness likely urinary retention. Hyponatremia.  Sodium 132 today. Hypokalemia.  Replace potassium orally today Chronic kidney disease stage IIIa.  Creatinine is a little bit worse today.  1.33.  Fluid bolus given earlier.  Check BMP tomorrow morning. Lactic acidosis on presentation Iron deficiency anemia  Code Status:     Code Status Orders  (From admission, onward)           Start     Ordered   01/22/21 2015  Full code  Continuous        01/22/21 2016           Code Status History     Date Active Date Inactive Code Status Order ID Comments User Context   01/17/2021 2035 01/20/2021 2202 DNR [id number]  Athena Masse, MD ED   01/12/2021 1029 01/15/2021 1814 DNR [id number]  Collier Bullock, MD ED   12/12/2020 1440 12/17/2020 1856 DNR [id number]  [place] J, DO ED   12/11/2020 1637 12/12/2020 1440 Full Code [id number]  Swayze, Radene Gunning, DO ED       Family Communication: Daughter at the bedside Disposition Plan: Status is: Inpatient.  Notified that rehab will be able to take over the weekend and will have to be Monday.  Consultants: Cardiology  Time spent: 27 minutes  [place]

## 2021-01-25 DIAGNOSIS — I951 Orthostatic hypotension: Secondary | ICD-10-CM | POA: Diagnosis not present

## 2021-01-25 DIAGNOSIS — I482 Chronic atrial fibrillation, unspecified: Secondary | ICD-10-CM | POA: Diagnosis not present

## 2021-01-25 DIAGNOSIS — N179 Acute kidney failure, unspecified: Secondary | ICD-10-CM | POA: Diagnosis not present

## 2021-01-25 DIAGNOSIS — E871 Hypo-osmolality and hyponatremia: Secondary | ICD-10-CM

## 2021-01-25 DIAGNOSIS — R338 Other retention of urine: Secondary | ICD-10-CM | POA: Diagnosis not present

## 2021-01-25 DIAGNOSIS — N189 Chronic kidney disease, unspecified: Secondary | ICD-10-CM

## 2021-01-25 LAB — BASIC METABOLIC PANEL
Anion gap: 7 (ref 5–15)
BUN: 29 mg/dL — ABNORMAL HIGH (ref 8–23)
CO2: 25 mmol/L (ref 22–32)
Calcium: 7.7 mg/dL — ABNORMAL LOW (ref 8.9–10.3)
Chloride: 102 mmol/L (ref 98–111)
Creatinine, Ser: 1.52 mg/dL — ABNORMAL HIGH (ref 0.61–1.24)
GFR, Estimated: 42 mL/min — ABNORMAL LOW (ref >60)
Glucose, Bld: 83 mg/dL (ref 70–99)
Potassium: 3.7 mmol/L (ref 3.5–5.1)
Sodium: 134 mmol/L — ABNORMAL LOW (ref 135–145)

## 2021-01-25 LAB — CBC
HCT: 23.6 % — ABNORMAL LOW (ref 39.0–52.0)
Hemoglobin: 8 g/dL — ABNORMAL LOW (ref 13.0–17.0)
MCH: 34 pg (ref 26.0–34.0)
MCHC: 33.9 g/dL (ref 30.0–36.0)
MCV: 100.4 fL — ABNORMAL HIGH (ref 80.0–100.0)
Platelets: 278 10*3/uL (ref 150–400)
RBC: 2.35 MIL/uL — ABNORMAL LOW (ref 4.22–5.81)
RDW: 17.4 % — ABNORMAL HIGH (ref 11.5–15.5)
WBC: 8.2 10*3/uL (ref 4.0–10.5)
nRBC: 0 % (ref 0.0–0.2)

## 2021-01-25 MED ORDER — BETHANECHOL CHLORIDE 25 MG PO TABS
25.0000 mg | ORAL_TABLET | Freq: Once | ORAL | Status: AC
Start: 2021-01-25 — End: 2021-01-25
  Administered 2021-01-25: 25 mg via ORAL
  Filled 2021-01-25: qty 1

## 2021-01-25 MED ORDER — BETHANECHOL CHLORIDE 10 MG PO TABS
10.0000 mg | ORAL_TABLET | Freq: Three times a day (TID) | ORAL | Status: DC
  Administered 2021-01-25 (×2): 10 mg via ORAL
  Filled 2021-01-25 (×4): qty 1

## 2021-01-25 MED ORDER — CHLORHEXIDINE GLUCONATE CLOTH 2 % EX PADS
6.0000 | MEDICATED_PAD | Freq: Every day | CUTANEOUS | Status: DC
  Administered 2021-01-25 – 2021-01-28 (×4): 6 via TOPICAL

## 2021-01-25 MED ORDER — SODIUM CHLORIDE 0.9 % IV BOLUS
500.0000 mL | Freq: Once | INTRAVENOUS | Status: AC
  Administered 2021-01-25: 500 mL via INTRAVENOUS

## 2021-01-25 NOTE — Progress Notes (Signed)
Patient ID: Thomas Matthews, male   DOB: 01/30/1924, 87 y.o.   MRN: [id number] [facility] PROGRESS NOTE  STOKELY JEANCHARLES MKL:[id number] DOB: 16-Mar-1923 DOA: 01/22/2021 PCP: Birdie Sons, MD  HPI/Subjective: Patient required a few in and out catheterizations.  May end up needing a Foley catheter if does not urinate today.  He felt okay when I was in the room with him this morning.  Objective: Vitals:   01/25/21 1108 01/25/21 1534  BP: (!) 121/57 (!) 102/55  Pulse: 73 77  Resp: 18 20  Temp: 98.1 F (36.7 C) 97.8 F (36.6 C)  SpO2: 99% 98%    Intake/Output Summary (Last 24 hours) at 01/25/2021 1608 Last data filed at 01/24/2021 2200 Gross per 24 hour  Intake --  Output 600 ml  Net -600 ml   Filed Weights   01/23/21 0432 01/24/21 0433 01/25/21 0506  Weight: 63.7 kg 64.4 kg 67.6 kg    ROS: Review of Systems  Respiratory:  Negative for shortness of breath.   Cardiovascular:  Negative for chest pain.  Gastrointestinal:  Negative for abdominal pain, nausea and vomiting.  Exam: Physical Exam HENT:     Head: Normocephalic.     Mouth/Throat:     Pharynx: No oropharyngeal exudate.  Eyes:     General: Lids are normal.     Conjunctiva/sclera: Conjunctivae normal.  Cardiovascular:     Rate and Rhythm: Normal rate and regular rhythm.     Heart sounds: Normal heart sounds, S1 normal and S2 normal.  Pulmonary:     Breath sounds: No decreased breath sounds, wheezing, rhonchi or rales.  Abdominal:     Palpations: Abdomen is soft.     Tenderness: There is no abdominal tenderness.  Musculoskeletal:     Right lower leg: No swelling.     Left lower leg: No swelling.  Skin:    General: Skin is warm.     Findings: No rash.  Neurological:     Mental Status: He is alert.      Scheduled Meds:  amiodarone  200 mg Oral BID   bethanechol  10 mg Oral TID   cholecalciferol  2,000 Units Oral Daily   finasteride  5 mg Oral Daily   heparin  5,000 Units Subcutaneous Q8H    midodrine  10 mg Oral TID WC   pantoprazole  40 mg Oral BID   pravastatin  10 mg Oral Daily   tamsulosin  0.4 mg Oral QPC breakfast   Continuous Infusions:  sodium chloride      Assessment/Plan:  Orthostatic hypotension.  Blood pressure still a little lower side.  Continue to check orthostatics.  Continue midodrine 10 mg 3 times a day Acute urinary retention requiring intermittent catheterizations.  I discontinued Toviaz.  Continue Flomax and Proscar.  May end up needing a Foley catheter if does not urinate. Chronic atrial fibrillation on amiodarone for rate control.  Atrial tachycardia seen on interrogation of pacer Acute kidney injury on chronic disease stage IIIa.  Creatinine 1.52 today.  Likely from urinary retention.  I will give a fluid bolus today.  May end up needing a Foley catheter. Recent hip fracture.  Continue physical therapy evaluation.  Patient will require rehab Chronic diastolic congestive heart failure.  Holding diuretics with orthostatic hypotension Hyponatremia.  Sodium up to 134 today Lactic acidosis on presentation Iron deficiency anemia  Code Status:     Code Status Orders  (From admission, onward)  Start     Ordered   01/22/21 2015  Full code  Continuous        01/22/21 2016           Code Status History     Date Active Date Inactive Code Status Order ID Comments User Context   01/17/2021 2035 01/20/2021 2202 DNR [id number]  Athena Masse, MD ED   01/12/2021 1029 01/15/2021 1814 DNR [id number]  Collier Bullock, MD ED   12/12/2020 1440 12/17/2020 1856 DNR [id number]  [place] Eric J, DO ED   12/11/2020 1637 12/12/2020 1440 Full Code [id number]  Karie Kirks, DO ED      Family Communication: Spoke with daughter on the phone Disposition Plan: Status is: Inpatient  Time spent: 27 minutes  [place]

## 2021-01-25 NOTE — Progress Notes (Signed)
[facility]  SUBJECTIVE: Patient laying in bed, denies chest pain or shortness of breath   Vitals:   01/24/21 1527 01/24/21 1958 01/25/21 0506 01/25/21 0729  BP: 112/62 (!) 116/53 116/62 (!) 105/55  Pulse: 92 (!) 57 86 82  Resp: 18 17 18 18   Temp: 97.8 F (36.6 C) 98.3 F (36.8 C) 98.4 F (36.9 C) 98.7 F (37.1 C)  TempSrc:      SpO2: 98% 98% 98% 96%  Weight:   67.6 kg   Height:         Intake/Output Summary (Last 24 hours) at 01/25/2021 5176 Last data filed at 01/24/2021 2200 Gross per 24 hour  Intake 240 ml  Output 1700 ml  Net -1460 ml      PHYSICAL EXAM  General: Well developed, well nourished, in no acute distress HEENT:  Normocephalic and atramatic Neck:  No JVD.  Lungs: Clear bilaterally to auscultation and percussion. Heart: HRRR . Normal S1 and S2 without gallops or murmurs.  Abdomen: Bowel sounds are positive, abdomen soft and non-tender  Msk:  Back normal, normal gait. Normal strength and tone for age. Extremities: No clubbing, cyanosis or edema.   Neuro: Alert and oriented X 3. Psych:  Good affect, responds appropriately   LABS: Basic Metabolic Panel: Recent Labs    01/24/21 0624 01/25/21 0718  NA 132* 134*  K 3.3* 3.7  CL 98 102  CO2 23 25  GLUCOSE 103* 83  BUN 27* 29*  CREATININE 1.33* 1.52*  CALCIUM 8.0* 7.7*  MG 2.0  --    Liver Function Tests: Recent Labs    01/22/21 1317  AST 28  ALT 12  ALKPHOS 67  BILITOT 1.4*  PROT 5.8*  ALBUMIN 2.8*   No results for input(s): LIPASE, AMYLASE in the last 72 hours. CBC: Recent Labs    01/22/21 1317 01/23/21 0524 01/24/21 0624 01/25/21 0718  WBC 8.2   < > 12.3* 8.2  NEUTROABS 6.8  --   --   --   HGB 9.0*   < > 9.3* 8.0*  HCT 27.5*   < > 27.3* 23.6*  MCV 102.2*   < > 98.2 100.4*  PLT 263   < > 308 278   < > = values in this interval not displayed.   Cardiac Enzymes: Recent Labs    01/23/21 0524  CKTOTAL 16*   BNP: Invalid input(s): POCBNP D-Dimer: No results for  input(s): DDIMER in the last 72 hours. Hemoglobin A1C: Recent Labs    01/23/21 0524  HGBA1C 5.4   Fasting Lipid Panel: No results for input(s): CHOL, HDL, LDLCALC, TRIG, CHOLHDL, LDLDIRECT in the last 72 hours. Thyroid Function Tests: No results for input(s): TSH, T4TOTAL, T3FREE, THYROIDAB in the last 72 hours.  Invalid input(s): FREET3 Anemia Panel: No results for input(s): VITAMINB12, FOLATE, FERRITIN, TIBC, IRON, RETICCTPCT in the last 72 hours.  No results found.   Echo LVEF 50 to 55% 12/14/2018  TELEMETRY: Ventricular pacing 73 bpm:  ASSESSMENT AND PLAN:  Principal Problem:   Hypotension Active Problems:   CAD (coronary artery disease)   History of GI bleed   Atrial fibrillation, chronic (HCC)   Chronic diastolic CHF (congestive heart failure) (HCC)   COPD (chronic obstructive pulmonary disease) (HCC)   BPH (benign prostatic hyperplasia)   Atrial tachycardia ([place])   Acute urinary retention    1.  Chronic atrial fibrillation, not anticoagulated due to history of GI bleeding, on amiodarone 200 mg twice daily for rate and rhythm  control 2.  Sick sinus syndrome, status post pacemaker, pacemaker interrogation reveals only functioning single-chamber pacemaker 3.  Status post TAVR 2019 4.  History of nonsustained VT, short runs of wide-complex tachycardia on telemetry, probable atrial fibrillation with abberancy of uncertain clinical significance 5.  Low blood pressure, multifactorial, metoprolol held, on midodrine  Recommendations  1.  Agree with current therapy 2.  Continue amiodarone 2 mg twice daily 3.  Continue to hold metoprolol 4.  Defer further cardiac diagnostics 5.  Follow-up with Dr. Nehemiah Massed 1 to 2 weeks after discharge  Sign off sign off for now, please call if any questions   Isaias Cowman, MD, PhD, [facility] 01/25/2021 9:16 AM

## 2021-01-25 NOTE — Plan of Care (Signed)
  Problem: Fluid Volume: Goal: Hemodynamic stability will improve Outcome: Progressing   Problem: Clinical Measurements: Goal: Diagnostic test results will improve Outcome: Progressing Goal: Signs and symptoms of infection will decrease Outcome: Progressing   Problem: Respiratory: Goal: Ability to maintain adequate ventilation will improve Outcome: Progressing   Problem: Education: Goal: Knowledge of General Education information will improve Description: Including pain rating scale, medication(s)/side effects and non-pharmacologic comfort measures Outcome: Progressing   Problem: Clinical Measurements: Goal: Ability to maintain clinical measurements within normal limits will improve Outcome: Progressing Goal: Will remain free from infection Outcome: Progressing Goal: Diagnostic test results will improve Outcome: Progressing Goal: Respiratory complications will improve Outcome: Progressing Goal: Cardiovascular complication will be avoided Outcome: Progressing   Problem: Nutrition: Goal: Adequate nutrition will be maintained Outcome: Progressing   Problem: Elimination: Goal: Will not experience complications related to bowel motility Outcome: Progressing Goal: Will not experience complications related to urinary retention Outcome: Progressing   Problem: Safety: Goal: Ability to remain free from injury will improve Outcome: Progressing   Problem: Skin Integrity: Goal: Risk for impaired skin integrity will decrease Outcome: Progressing

## 2021-01-25 NOTE — Progress Notes (Signed)
PT Cancellation Note  Patient Details Name: Thomas Matthews MRN: [id number] DOB: [birth year]   Cancelled Treatment:     PT attempt. PT refused. Pt was laying in bed awake upon arriving. Untouched lunch tray at bedside. Pt was unwilling to eat even with assistance offered. " I think I want to die."  Per RN, he did not eat breakfast either. Max encouragement to participate in OOB activity however pt remained unwilling. RN came into room with author to encourage participation/ standing to urinate however without success. Pt gets slightly agitated when encouraged to get OOB. Will continue efforts to progress pt to maximal independence.   Willette Pa 01/25/2021, 1:37 PM

## 2021-01-26 DIAGNOSIS — D509 Iron deficiency anemia, unspecified: Secondary | ICD-10-CM

## 2021-01-26 DIAGNOSIS — I951 Orthostatic hypotension: Secondary | ICD-10-CM | POA: Diagnosis not present

## 2021-01-26 DIAGNOSIS — R338 Other retention of urine: Secondary | ICD-10-CM | POA: Diagnosis not present

## 2021-01-26 DIAGNOSIS — E872 Acidosis, unspecified: Secondary | ICD-10-CM

## 2021-01-26 DIAGNOSIS — N179 Acute kidney failure, unspecified: Secondary | ICD-10-CM | POA: Diagnosis not present

## 2021-01-26 DIAGNOSIS — I482 Chronic atrial fibrillation, unspecified: Secondary | ICD-10-CM | POA: Diagnosis not present

## 2021-01-26 LAB — BASIC METABOLIC PANEL
Anion gap: 6 (ref 5–15)
BUN: 25 mg/dL — ABNORMAL HIGH (ref 8–23)
CO2: 24 mmol/L (ref 22–32)
Calcium: 7.8 mg/dL — ABNORMAL LOW (ref 8.9–10.3)
Chloride: 102 mmol/L (ref 98–111)
Creatinine, Ser: 1.14 mg/dL (ref 0.61–1.24)
GFR, Estimated: 59 mL/min — ABNORMAL LOW (ref >60)
Glucose, Bld: 104 mg/dL — ABNORMAL HIGH (ref 70–99)
Potassium: 4 mmol/L (ref 3.5–5.1)
Sodium: 132 mmol/L — ABNORMAL LOW (ref 135–145)

## 2021-01-26 LAB — HEMOGLOBIN: Hemoglobin: 8.2 g/dL — ABNORMAL LOW (ref 13.0–17.0)

## 2021-01-26 MED ORDER — SODIUM CHLORIDE 0.9 % IV SOLN
400.0000 mg | Freq: Once | INTRAVENOUS | Status: AC
  Administered 2021-01-26: 400 mg via INTRAVENOUS
  Filled 2021-01-26: qty 20

## 2021-01-26 MED ORDER — ENSURE ENLIVE PO LIQD
237.0000 mL | Freq: Two times a day (BID) | ORAL | Status: DC
  Administered 2021-01-26 – 2021-01-28 (×2): 237 mL via ORAL

## 2021-01-26 MED ORDER — POLYETHYLENE GLYCOL 3350 17 G PO PACK
17.0000 g | PACK | Freq: Every day | ORAL | Status: DC
Start: 2021-01-26 — End: 2021-01-27
  Administered 2021-01-26: 17 g via ORAL
  Filled 2021-01-26: qty 1

## 2021-01-26 NOTE — Progress Notes (Signed)
PT Cancellation Note  Patient Details Name: Thomas Matthews MRN: [id number] DOB: [birth year]   Cancelled Treatment:    Reason Eval/Treat Not Completed: Other (comment)  Attempted x 2 this am.  Pt refused both attempt despite encouragement and multiple approaches  "I don't recon so."  And just shakes his head.  Assisted with fluids.  Will attempt again tomorrow.   Chesley Noon 01/26/2021, 11:45 AM

## 2021-01-26 NOTE — Progress Notes (Signed)
Patient ID: Thomas Matthews, male   DOB: Apr 01, 1923, 87 y.o.   MRN: [id number] [facility] PROGRESS NOTE  Thomas Matthews [id number]:[id number] DOB: [birth year] DOA: 01/22/2021 PCP: Thomas Sons, MD  HPI/Subjective: Patient hard of hearing.  Feels okay.  States he ate okay but as per nursing staff he only ate 1 meal yesterday.  Required Foley catheter placement yesterday for urinary retention.  Objective: Vitals:   01/26/21 0746 01/26/21 1119  BP: 116/65 130/63  Pulse: 81 81  Resp: 20 17  Temp: 98.2 F (36.8 C) 98.3 F (36.8 C)  SpO2: 99% 91%    Intake/Output Summary (Last 24 hours) at 01/26/2021 1329 Last data filed at 01/26/2021 0500 Gross per 24 hour  Intake 282.01 ml  Output 1150 ml  Net -867.99 ml   Filed Weights   01/24/21 0433 01/25/21 0506 01/26/21 0443  Weight: 64.4 kg 67.6 kg 71 kg    ROS: Review of Systems  Respiratory:  Negative for shortness of breath.   Cardiovascular:  Negative for chest pain.  Gastrointestinal:  Negative for abdominal pain, nausea and vomiting.  Exam: Physical Exam HENT:     Head: Normocephalic.     Mouth/Throat:     Pharynx: No oropharyngeal exudate.  Eyes:     General: Lids are normal.     Conjunctiva/sclera: Conjunctivae normal.  Cardiovascular:     Rate and Rhythm: Normal rate and regular rhythm.     Heart sounds: Normal heart sounds, S1 normal and S2 normal.  Pulmonary:     Breath sounds: No decreased breath sounds, wheezing, rhonchi or rales.  Abdominal:     Palpations: Abdomen is soft.     Tenderness: There is no abdominal tenderness.  Musculoskeletal:     Right lower leg: No swelling.     Left lower leg: No swelling.  Skin:    General: Skin is warm.     Findings: No rash.  Neurological:     Mental Status: He is alert.     Comments: Answer some simple yes or no questions.      Scheduled Meds:  amiodarone  200 mg Oral BID   Chlorhexidine Gluconate Cloth  6 each Topical Daily   cholecalciferol  2,000 Units  Oral Daily   finasteride  5 mg Oral Daily   heparin  5,000 Units Subcutaneous Q8H   midodrine  10 mg Oral TID WC   pantoprazole  40 mg Oral BID   pravastatin  10 mg Oral Daily   tamsulosin  0.4 mg Oral QPC breakfast   Assessment/Plan:  Orthostatic hypotension.  Blood pressure starting to come up on midodrine 10 mg 3 times a day.  Asked nursing staff to check orthostatics again today. Acute urinary retention requiring Foley catheter placement yesterday.  Initially had a in and out catheterizations for 24 hours without urinating on his own.  Continue Flomax and Proscar. Chronic atrial fibrillation on amiodarone for rate control.  Atrial tachycardia seen on interrogation of pacer.  Blood pressure too low for metoprolol at this point. Acute kidney injury on chronic kidney disease stage IIIa.  Creatinine peaked at 1.52.  Suspect likely from urinary retention.  Creatinine down to 1.14 today. Chronic diastolic congestive heart failure.  No diuretics or beta-blockers with orthostatic hypotension. Recent hip fracture.  Sutures can come out tomorrow.  Will need rehab. Lactic acidosis on presentation Iron deficiency anemia.  Hemoglobin today 8.2.  Likely lower than his normal with fluid boluses that I gave him here.  We  will give IV iron today. Hyponatremia.  Sodium 132 today       Code Status:     Code Status Orders  (From admission, onward)           Start     Ordered   01/22/21 2015  Full code  Continuous        01/22/21 2016           Code Status History     Date Active Date Inactive Code Status Order ID Comments User Context   01/17/2021 2035 01/20/2021 2202 DNR [id number]  Athena Masse, MD ED   01/12/2021 1029 01/15/2021 1814 DNR [id number]  Collier Bullock, MD ED   12/12/2020 1440 12/17/2020 1856 DNR [id number]  [place] Eric J, DO ED   12/11/2020 1637 12/12/2020 1440 Full Code [id number]  Swayze, Radene Gunning, DO ED      Family Communication: Left message for patient's  daughter Disposition Plan: Status is: Inpatient  Time spent: 27 minutes  [place]

## 2021-01-26 NOTE — Progress Notes (Signed)
Patient refused orthostatic vital signs x 2 today. Offered assistance and encouragement but stated "No, I am not going to get up. I just don't want to." Patient currently resting and comfortable. Will continue to monitor.

## 2021-01-27 ENCOUNTER — Inpatient Hospital Stay: Payer: Medicare Other

## 2021-01-27 DIAGNOSIS — K5900 Constipation, unspecified: Secondary | ICD-10-CM

## 2021-01-27 DIAGNOSIS — R1030 Lower abdominal pain, unspecified: Secondary | ICD-10-CM

## 2021-01-27 LAB — BASIC METABOLIC PANEL
Anion gap: 6 (ref 5–15)
BUN: 22 mg/dL (ref 8–23)
CO2: 24 mmol/L (ref 22–32)
Calcium: 7.9 mg/dL — ABNORMAL LOW (ref 8.9–10.3)
Chloride: 101 mmol/L (ref 98–111)
Creatinine, Ser: 0.96 mg/dL (ref 0.61–1.24)
GFR, Estimated: >60 mL/min (ref >60)
Glucose, Bld: 90 mg/dL (ref 70–99)
Potassium: 3.7 mmol/L (ref 3.5–5.1)
Sodium: 131 mmol/L — ABNORMAL LOW (ref 135–145)

## 2021-01-27 LAB — CULTURE, BLOOD (ROUTINE X 2)
Culture: NO GROWTH
Culture: NO GROWTH
Special Requests: ADEQUATE
Special Requests: ADEQUATE

## 2021-01-27 LAB — RESP PANEL BY RT-PCR (FLU A&B, COVID) ARPGX2
Influenza A by PCR: NEGATIVE
Influenza B by PCR: NEGATIVE
SARS Coronavirus 2 by RT PCR: NEGATIVE

## 2021-01-27 LAB — HEMOGLOBIN: Hemoglobin: 8.4 g/dL — ABNORMAL LOW (ref 13.0–17.0)

## 2021-01-27 MED ORDER — BISACODYL 10 MG RE SUPP
10.0000 mg | Freq: Once | RECTAL | Status: AC
  Administered 2021-01-27: 10 mg via RECTAL
  Filled 2021-01-27: qty 1

## 2021-01-27 MED ORDER — IOHEXOL 9 MG/ML PO SOLN
500.0000 mL | Freq: Once | ORAL | Status: DC | PRN
  Administered 2021-01-27: 500 mL via ORAL

## 2021-01-27 MED ORDER — POLYETHYLENE GLYCOL 3350 17 G PO PACK: 17.0000 g | PACK | Freq: Every day | ORAL | Status: DC | PRN

## 2021-01-27 MED ORDER — FLEET ENEMA 7-19 GM/118ML RE ENEM: 1.0000 | ENEMA | Freq: Every day | RECTAL | Status: DC

## 2021-01-27 MED ORDER — POLYETHYLENE GLYCOL 3350 17 G PO PACK
17.0000 g | PACK | Freq: Every day | ORAL | Status: DC
  Administered 2021-01-28: 17 g via ORAL
  Filled 2021-01-27: qty 1

## 2021-01-27 NOTE — Care Management Important Message (Signed)
Important Message  Patient Details  Name: Thomas Matthews MRN: [id number] Date of Birth: May 01, 1923   Medicare Important Message Given:  Yes     [name] 01/27/2021, 1:57 PM

## 2021-01-27 NOTE — Progress Notes (Signed)
Physical Therapy Treatment Patient Details Name: Thomas Matthews MRN: [id number] DOB: 1923-10-07 Today's Date: 01/27/2021   History of Present Illness Pt is a 87 yo man that presented to ED from rehab due to low BP. Recently discharged from hospital s/p hip ORIF 11/27 ([facility]). PMH of dementia, CHF, COPD, pacemaker, HTN,    PT Comments    Pt awake and seems to be in better spirits today.  Agrees with promise of warm blankets after session.  Orthostatic BP's taken.  BP stable with only slight decrease 149/83 supine to 139/69 after 3 minutes.  Full vitals documented in flow sheets.  No c/o dizziness and overall tolerated intervention well.  Is able to transfer to chair with small steps of poor quality but only +1 assist.  Remained in recliner with needs met and warm blankets as requested.  Discussed with MD and RN.   Recommendations for follow up therapy are one component of a multi-disciplinary discharge planning process, led by the attending physician.  Recommendations may be updated based on patient status, additional functional criteria and insurance authorization.  Follow Up Recommendations  Skilled nursing-short term rehab (<3 hours/day)     Assistance Recommended at Discharge Frequent or constant Supervision/Assistance  Equipment Recommendations  Other (comment)    Recommendations for Other Services       Precautions / Restrictions Precautions Precautions: Fall Restrictions Weight Bearing Restrictions: Yes LLE Weight Bearing: Weight bearing as tolerated     Mobility  Bed Mobility Overal bed mobility: Needs Assistance Bed Mobility: Supine to Sit     Supine to sit: Min assist          Transfers Overall transfer level: Needs assistance Equipment used: Rolling walker (2 wheels) Transfers: Sit to/from Stand Sit to Stand: Min assist                Ambulation/Gait Ambulation/Gait assistance: Mod assist Gait Distance (Feet): 2 Feet Assistive device: Rolling  walker (2 wheels) Gait Pattern/deviations: Step-to pattern Gait velocity: decreased         Stairs             Wheelchair Mobility    Modified Rankin (Stroke Patients Only)       Balance Overall balance assessment: Needs assistance Sitting-balance support: No upper extremity supported;Feet supported Sitting balance-Leahy Scale: Fair Sitting balance - Comments: close sup with bilat UE support; limited movement outside immediate BOS   Standing balance support: Bilateral upper extremity supported Standing balance-Leahy Scale: Poor                              Cognition Arousal/Alertness: Awake/alert Behavior During Therapy: WFL for tasks assessed/performed Overall Cognitive Status: No family/caregiver present to determine baseline cognitive functioning                                          Exercises      General Comments        Pertinent Vitals/Pain Pain Assessment: No/denies pain Breathing: normal Negative Vocalization: none Facial Expression: smiling or inexpressive Body Language: relaxed Consolability: no need to console PAINAD Score: 0 Pain Intervention(s): Monitored during session    Home Living                          Prior Function  PT Goals (current goals can now be found in the care plan section) Progress towards PT goals: Progressing toward goals    Frequency    7X/week      PT Plan Current plan remains appropriate    Co-evaluation              AM-PAC PT "6 Clicks" Mobility   Outcome Measure  Help needed turning from your back to your side while in a flat bed without using bedrails?: A Lot Help needed moving from lying on your back to sitting on the side of a flat bed without using bedrails?: A Lot Help needed moving to and from a bed to a chair (including a wheelchair)?: A Lot Help needed standing up from a chair using your arms (e.g., wheelchair or bedside chair)?:  A Little Help needed to walk in hospital room?: A Lot Help needed climbing 3-5 steps with a railing? : Total 6 Click Score: 12    End of Session Equipment Utilized During Treatment: Gait belt Activity Tolerance: Patient tolerated treatment well Patient left: in chair;with call bell/phone within reach;with chair alarm set Nurse Communication: Mobility status;Other (comment) PT Visit Diagnosis: Muscle weakness (generalized) (M62.81);Difficulty in walking, not elsewhere classified (R26.2);Pain Pain - Right/Left: Left Pain - part of body: Hip     Time: 3818-4037 PT Time Calculation (min) (ACUTE ONLY): 25 min  Charges:  $Therapeutic Activity: 23-37 mins                    Chesley Noon, PTA 01/27/21, 9:30 AM

## 2021-01-27 NOTE — TOC Progression Note (Signed)
Transition of Care [facility]) - Progression Note    Patient Details  Name: Thomas Matthews MRN: [id number] Date of Birth: Mar 10, 1923  [facility] CM/SW [place], [place] Phone Number: 01/27/2021, 10:59 AM  Clinical Narrative:     TOC following for discharge readiness to [facility].   Per MD ordering ct scan, pending results to determine discharge readiness.   Magda Paganini at [facility] has been updated.   Expected Discharge Plan: [place] Barriers to Discharge: Continued Medical Work up  Expected Discharge Plan and Services Expected Discharge Plan: [place] arrangements for the past 2 months: [place]                                       Social Determinants of Health (SDOH) Interventions    Readmission Risk Interventions No flowsheet data found.

## 2021-01-27 NOTE — Progress Notes (Signed)
Patient ID: Thomas Matthews, male   DOB: [birth year], 87 y.o.   MRN: [id number] [facility] PROGRESS NOTE  LIEM COPENHAVER WIO:[id number] DOB: [birth year] DOA: 01/22/2021 PCP: Birdie Sons, MD  HPI/Subjective: Patient had some lower abdominal pain today.  Had a bowel movement yesterday.  Initially was resistant about sitting up in the chair.  Did better with physical therapy today and did not drop his blood pressure on the higher dose midodrine.  Patient was found to be orthostatic on admission.  Objective: Vitals:   01/27/21 1431 01/27/21 1650  BP: (!) 148/81 (!) 143/66  Pulse: 79 78  Resp: 18 19  Temp: 97.8 F (36.6 C) 97.7 F (36.5 C)  SpO2: 99% 97%    Intake/Output Summary (Last 24 hours) at 01/27/2021 1711 Last data filed at 01/27/2021 1432 Gross per 24 hour  Intake 770 ml  Output 1050 ml  Net -280 ml   Filed Weights   01/25/21 0506 01/26/21 0443 01/27/21 0500  Weight: 67.6 kg 71 kg 66.2 kg    ROS: Review of Systems  Respiratory:  Negative for shortness of breath.   Cardiovascular:  Negative for chest pain.  Gastrointestinal:  Positive for abdominal pain. Negative for nausea and vomiting.  Exam: Physical Exam HENT:     Head: Normocephalic.     Mouth/Throat:     Pharynx: No oropharyngeal exudate.  Eyes:     General: Lids are normal.     Conjunctiva/sclera: Conjunctivae normal.  Cardiovascular:     Rate and Rhythm: Normal rate and regular rhythm.     Heart sounds: Normal heart sounds, S1 normal and S2 normal.  Pulmonary:     Breath sounds: No decreased breath sounds, wheezing, rhonchi or rales.  Abdominal:     Palpations: Abdomen is soft.     Tenderness: There is abdominal tenderness in the suprapubic area and left lower quadrant.  Musculoskeletal:     Right lower leg: No swelling.     Left lower leg: No swelling.  Skin:    General: Skin is warm.     Findings: No rash.  Neurological:     Mental Status: He is alert.     Comments: Answer some  simple questions.      Scheduled Meds:  amiodarone  200 mg Oral BID   bisacodyl  10 mg Rectal Once   Chlorhexidine Gluconate Cloth  6 each Topical Daily   cholecalciferol  2,000 Units Oral Daily   feeding supplement  237 mL Oral BID BM   finasteride  5 mg Oral Daily   heparin  5,000 Units Subcutaneous Q8H   midodrine  10 mg Oral TID WC   pantoprazole  40 mg Oral BID   sodium phosphate  1 enema Rectal QHS   tamsulosin  0.4 mg Oral QPC breakfast    Assessment/Plan:  Orthostatic hypotension.  Blood pressure held today with working with physical therapy on midodrine 10 mg 3 times a day.  Patient was able to sit in the chair today. Acute urinary retention requiring Foley catheter placement.  Continue Flomax and Proscar. Lower abdominal pain with constipation seen on CT scan.  We will give a Dulcolax suppository now and Fleet enema tonight.  MiraLAX daily Acute kidney injury on chronic kidney disease stage II.  Creatinine peaked at 1.52.  After Foley catheter placement creatinine trended better to 0.96 today Chronic atrial fibrillation on amiodarone for rate control.  Atrial tachycardia seen on interrogation of pacer.  Since patient is orthostatic needed  to get rid of beta-blocker. Chronic diastolic congestive heart failure.  No diuretics or beta-blockers with orthostatic hypotension at this point Recent hip fracture.  Sutures out already.  Mortality high within the first year of hip fracture especially if you do not get up and walk again. Lactic acidosis on presentation Iron deficiency anemia.  Patient given IV iron while here.  Hemoglobin 8.4. Hyponatremia.  Sodium 131       Code Status:     Code Status Orders  (From admission, onward)           Start     Ordered   01/22/21 2015  Full code  Continuous        01/22/21 2016           Code Status History     Date Active Date Inactive Code Status Order ID Comments User Context   01/17/2021 2035 01/20/2021 2202 DNR  [id number]  Athena Masse, MD ED   01/12/2021 1029 01/15/2021 1814 DNR [id number]  Collier Bullock, MD ED   12/12/2020 1440 12/17/2020 1856 DNR [id number]  [place] Eric J, DO ED   12/11/2020 1637 12/12/2020 1440 Full Code [id number]  Karie Kirks, DO ED      Family Communication: Spoke with daughter on the phone Disposition Plan: Status is: Inpatient  Time spent: 28 minutes  [place]

## 2021-01-28 DIAGNOSIS — Z515 Encounter for palliative care: Secondary | ICD-10-CM | POA: Diagnosis not present

## 2021-01-28 DIAGNOSIS — Z66 Do not resuscitate: Secondary | ICD-10-CM | POA: Diagnosis present

## 2021-01-28 DIAGNOSIS — J9811 Atelectasis: Secondary | ICD-10-CM | POA: Diagnosis not present

## 2021-01-28 DIAGNOSIS — R739 Hyperglycemia, unspecified: Secondary | ICD-10-CM | POA: Diagnosis not present

## 2021-01-28 DIAGNOSIS — N401 Enlarged prostate with lower urinary tract symptoms: Secondary | ICD-10-CM | POA: Diagnosis present

## 2021-01-28 DIAGNOSIS — R4182 Altered mental status, unspecified: Secondary | ICD-10-CM | POA: Diagnosis present

## 2021-01-28 DIAGNOSIS — E871 Hypo-osmolality and hyponatremia: Secondary | ICD-10-CM | POA: Diagnosis not present

## 2021-01-28 DIAGNOSIS — Z7401 Bed confinement status: Secondary | ICD-10-CM | POA: Diagnosis not present

## 2021-01-28 DIAGNOSIS — I482 Chronic atrial fibrillation, unspecified: Secondary | ICD-10-CM | POA: Diagnosis present

## 2021-01-28 DIAGNOSIS — S7292XA Unspecified fracture of left femur, initial encounter for closed fracture: Secondary | ICD-10-CM | POA: Diagnosis not present

## 2021-01-28 DIAGNOSIS — Z7189 Other specified counseling: Secondary | ICD-10-CM | POA: Diagnosis not present

## 2021-01-28 DIAGNOSIS — G629 Polyneuropathy, unspecified: Secondary | ICD-10-CM | POA: Diagnosis not present

## 2021-01-28 DIAGNOSIS — I4729 Other ventricular tachycardia: Secondary | ICD-10-CM | POA: Diagnosis not present

## 2021-01-28 DIAGNOSIS — Z87891 Personal history of nicotine dependence: Secondary | ICD-10-CM | POA: Diagnosis not present

## 2021-01-28 DIAGNOSIS — Z950 Presence of cardiac pacemaker: Secondary | ICD-10-CM | POA: Diagnosis not present

## 2021-01-28 DIAGNOSIS — R4020 Unspecified coma: Secondary | ICD-10-CM | POA: Diagnosis not present

## 2021-01-28 DIAGNOSIS — R6520 Severe sepsis without septic shock: Secondary | ICD-10-CM | POA: Diagnosis present

## 2021-01-28 DIAGNOSIS — M8580 Other specified disorders of bone density and structure, unspecified site: Secondary | ICD-10-CM | POA: Diagnosis not present

## 2021-01-28 DIAGNOSIS — D62 Acute posthemorrhagic anemia: Secondary | ICD-10-CM | POA: Diagnosis not present

## 2021-01-28 DIAGNOSIS — S72142D Displaced intertrochanteric fracture of left femur, subsequent encounter for closed fracture with routine healing: Secondary | ICD-10-CM | POA: Diagnosis not present

## 2021-01-28 DIAGNOSIS — E876 Hypokalemia: Secondary | ICD-10-CM | POA: Diagnosis present

## 2021-01-28 DIAGNOSIS — U071 COVID-19: Secondary | ICD-10-CM | POA: Diagnosis not present

## 2021-01-28 DIAGNOSIS — T83511A Infection and inflammatory reaction due to indwelling urethral catheter, initial encounter: Secondary | ICD-10-CM | POA: Diagnosis present

## 2021-01-28 DIAGNOSIS — Y732 Prosthetic and other implants, materials and accessory gastroenterology and urology devices associated with adverse incidents: Secondary | ICD-10-CM | POA: Diagnosis present

## 2021-01-28 DIAGNOSIS — W19XXXA Unspecified fall, initial encounter: Secondary | ICD-10-CM | POA: Diagnosis not present

## 2021-01-28 DIAGNOSIS — Z743 Need for continuous supervision: Secondary | ICD-10-CM | POA: Diagnosis not present

## 2021-01-28 DIAGNOSIS — G9340 Encephalopathy, unspecified: Secondary | ICD-10-CM | POA: Diagnosis not present

## 2021-01-28 DIAGNOSIS — Z888 Allergy status to other drugs, medicaments and biological substances status: Secondary | ICD-10-CM | POA: Diagnosis not present

## 2021-01-28 DIAGNOSIS — Z466 Encounter for fitting and adjustment of urinary device: Secondary | ICD-10-CM | POA: Diagnosis not present

## 2021-01-28 DIAGNOSIS — Z952 Presence of prosthetic heart valve: Secondary | ICD-10-CM | POA: Diagnosis not present

## 2021-01-28 DIAGNOSIS — A419 Sepsis, unspecified organism: Secondary | ICD-10-CM | POA: Diagnosis not present

## 2021-01-28 DIAGNOSIS — I5032 Chronic diastolic (congestive) heart failure: Secondary | ICD-10-CM | POA: Diagnosis not present

## 2021-01-28 DIAGNOSIS — I951 Orthostatic hypotension: Secondary | ICD-10-CM | POA: Diagnosis not present

## 2021-01-28 DIAGNOSIS — J208 Acute bronchitis due to other specified organisms: Secondary | ICD-10-CM | POA: Diagnosis not present

## 2021-01-28 DIAGNOSIS — J449 Chronic obstructive pulmonary disease, unspecified: Secondary | ICD-10-CM | POA: Diagnosis not present

## 2021-01-28 DIAGNOSIS — J90 Pleural effusion, not elsewhere classified: Secondary | ICD-10-CM | POA: Diagnosis not present

## 2021-01-28 DIAGNOSIS — E781 Pure hyperglyceridemia: Secondary | ICD-10-CM | POA: Diagnosis not present

## 2021-01-28 DIAGNOSIS — G9341 Metabolic encephalopathy: Secondary | ICD-10-CM | POA: Diagnosis not present

## 2021-01-28 DIAGNOSIS — I350 Nonrheumatic aortic (valve) stenosis: Secondary | ICD-10-CM | POA: Diagnosis not present

## 2021-01-28 DIAGNOSIS — R338 Other retention of urine: Secondary | ICD-10-CM | POA: Diagnosis not present

## 2021-01-28 DIAGNOSIS — N1831 Chronic kidney disease, stage 3a: Secondary | ICD-10-CM | POA: Diagnosis present

## 2021-01-28 DIAGNOSIS — W19XXXD Unspecified fall, subsequent encounter: Secondary | ICD-10-CM | POA: Diagnosis not present

## 2021-01-28 DIAGNOSIS — I5042 Chronic combined systolic (congestive) and diastolic (congestive) heart failure: Secondary | ICD-10-CM | POA: Diagnosis not present

## 2021-01-28 DIAGNOSIS — N179 Acute kidney failure, unspecified: Secondary | ICD-10-CM | POA: Diagnosis not present

## 2021-01-28 DIAGNOSIS — E43 Unspecified severe protein-calorie malnutrition: Secondary | ICD-10-CM | POA: Diagnosis not present

## 2021-01-28 DIAGNOSIS — Z7952 Long term (current) use of systemic steroids: Secondary | ICD-10-CM | POA: Diagnosis not present

## 2021-01-28 DIAGNOSIS — R404 Transient alteration of awareness: Secondary | ICD-10-CM | POA: Diagnosis not present

## 2021-01-28 DIAGNOSIS — I4892 Unspecified atrial flutter: Secondary | ICD-10-CM | POA: Diagnosis not present

## 2021-01-28 DIAGNOSIS — I10 Essential (primary) hypertension: Secondary | ICD-10-CM | POA: Diagnosis not present

## 2021-01-28 DIAGNOSIS — Z8719 Personal history of other diseases of the digestive system: Secondary | ICD-10-CM | POA: Diagnosis not present

## 2021-01-28 DIAGNOSIS — I2510 Atherosclerotic heart disease of native coronary artery without angina pectoris: Secondary | ICD-10-CM | POA: Diagnosis not present

## 2021-01-28 DIAGNOSIS — K5900 Constipation, unspecified: Secondary | ICD-10-CM | POA: Diagnosis not present

## 2021-01-28 DIAGNOSIS — A4159 Other Gram-negative sepsis: Secondary | ICD-10-CM | POA: Diagnosis present

## 2021-01-28 DIAGNOSIS — Z8679 Personal history of other diseases of the circulatory system: Secondary | ICD-10-CM | POA: Diagnosis not present

## 2021-01-28 DIAGNOSIS — Z8616 Personal history of COVID-19: Secondary | ICD-10-CM | POA: Diagnosis not present

## 2021-01-28 DIAGNOSIS — N182 Chronic kidney disease, stage 2 (mild): Secondary | ICD-10-CM | POA: Diagnosis not present

## 2021-01-28 DIAGNOSIS — N189 Chronic kidney disease, unspecified: Secondary | ICD-10-CM | POA: Diagnosis not present

## 2021-01-28 DIAGNOSIS — Z79899 Other long term (current) drug therapy: Secondary | ICD-10-CM | POA: Diagnosis not present

## 2021-01-28 DIAGNOSIS — I517 Cardiomegaly: Secondary | ICD-10-CM | POA: Diagnosis not present

## 2021-01-28 DIAGNOSIS — I495 Sick sinus syndrome: Secondary | ICD-10-CM | POA: Diagnosis not present

## 2021-01-28 MED ORDER — ACETAMINOPHEN 325 MG PO TABS: 650.0000 mg | ORAL_TABLET | Freq: Four times a day (QID) | ORAL | Status: AC | PRN

## 2021-01-28 MED ORDER — FINASTERIDE 5 MG PO TABS: 5.0000 mg | ORAL_TABLET | Freq: Every day | ORAL | 0 refills | Status: AC

## 2021-01-28 MED ORDER — FERROUS SULFATE 325 (65 FE) MG PO TABS: 325.0000 mg | ORAL_TABLET | Freq: Every day | ORAL | 0 refills | Status: AC

## 2021-01-28 MED ORDER — MIDODRINE HCL 10 MG PO TABS: 10.0000 mg | ORAL_TABLET | Freq: Three times a day (TID) | ORAL | 0 refills | Status: AC

## 2021-01-28 MED ORDER — FUROSEMIDE 20 MG PO TABS: 20.0000 mg | ORAL_TABLET | Freq: Every day | ORAL | 0 refills | Status: AC | PRN

## 2021-01-28 MED ORDER — POLYETHYLENE GLYCOL 3350 17 G PO PACK: 17.0000 g | PACK | Freq: Every day | ORAL | 0 refills | Status: AC

## 2021-01-28 MED ORDER — ENSURE ENLIVE PO LIQD: 237.0000 mL | Freq: Two times a day (BID) | ORAL | 0 refills | Status: AC

## 2021-01-28 NOTE — Discharge Summary (Signed)
[facility] NAME: Thomas Matthews    MR#:  [id number]  DATE OF BIRTH:  12-Jun-1923  DATE OF ADMISSION:  01/22/2021 ADMITTING PHYSICIAN: Para Skeans, MD  DATE OF DISCHARGE: 01/28/2021  PRIMARY CARE PHYSICIAN: Birdie Sons, MD    ADMISSION DIAGNOSIS:  Hypotension [I95.9] Hypotension, unspecified hypotension type [I95.9]  DISCHARGE DIAGNOSIS:  Principal Problem:   Hypotension Active Problems:   CAD (coronary artery disease)   History of GI bleed   Atrial fibrillation, chronic (HCC)   Chronic diastolic CHF (congestive heart failure) (HCC)   COPD (chronic obstructive pulmonary disease) (HCC)   BPH (benign prostatic hyperplasia)   Atrial tachycardia (HCC)   Acute urinary retention   Acute kidney injury superimposed on CKD (HCC)   Hyponatremia   Lactic acidosis   Iron deficiency anemia   Lower abdominal pain   Constipation   SECONDARY DIAGNOSIS:  History reviewed. No pertinent past medical history.  HOSPITAL COURSE:   1.  Orthostatic hypotension.  The patient was given a couple fluid boluses during the hospital course.  His midodrine was increased up to 10 mg 3 times a day.  His metoprolol was held.  Yesterday was able to tolerate sitting in the chair and did not drop his blood pressure with standing with physical therapy.  Patient stable to go out to rehab. 2.  Constipation seen on CT scan.  Given Dulcolax suppository and enema yesterday.  We will continue on MiraLAX and senna on a daily basis.  3.  Acute urinary retention requiring Foley catheter placement.  Recommend removing Foley catheter on 02/04/2021 in the AM for voiding trial.  Will need bladder scans after Foley is removed to make sure he is not retaining urine.  Continue Flomax and Proscar.  If fails voiding trial may end up needing urology appointment. 4.  Chronic atrial fibrillation on amiodarone for rate control.  With interrogation of the pacer we did see  atrial tachycardia.  Unable to give metoprolol with blood pressure being too low at this point. 5.  Acute kidney injury on chronic kidney disease stage II.  Creatinine worsened to 1.52 during the hospital course and came down to 0.96 after Foley catheter placement. 6.  Chronic diastolic congestive heart failure.  We were unable to give any beta-blocker or diuretic during the hospital course because blood pressure was too low.  We will change Lasix to 20 mg orally daily as needed for shortness of breath or weight gain of 3 pounds per day or 5 pounds in a week.  There were no signs of heart failure on this hospital stay. 7.  Recent hip fracture.  Sutures out.  We will go back to rehab.  TED hose and SCDs while in bed.  Completed 14 days of DVT prophylaxis after surgery.  Full weightbearing status. 8.  Lactic acidosis on presentation 9.  Iron deficiency anemia.  Last hemoglobin 8.4.  I gave the patient an iron infusion and can go back on oral iron. 10.  Hyponatremia.  Sodium of 132 yesterday  Recommend checking a CBC and BMP in 1 week Patient needs to continue working with physical therapy Check orthostatics prior to starting any medications that lower blood pressure.  DISCHARGE CONDITIONS:   Satisfactory  CONSULTS OBTAINED:  Cardiology  DRUG ALLERGIES:   Allergies  Allergen Reactions   Amlodipine Besylate Swelling   Felodipine Swelling   Oxybutynin Chloride Other (See Comments)    Other reaction(s): Dizziness Tolerates taking  at night   Simvastatin     Other reaction(s): Dizziness   Terazosin     Other reaction(s): Low blood pressure    DISCHARGE MEDICATIONS:   Allergies as of 01/28/2021       Reactions   Amlodipine Besylate Swelling   Felodipine Swelling   Oxybutynin Chloride Other (See Comments)   Other reaction(s): Dizziness Tolerates taking at night   Simvastatin    Other reaction(s): Dizziness   Terazosin    Other reaction(s): Low blood pressure         Medication List     STOP taking these medications    enoxaparin 30 MG/0.3ML injection Commonly known as: LOVENOX   HYDROcodone-acetaminophen 5-325 MG tablet Commonly known as: NORCO/VICODIN   metoprolol tartrate 25 MG tablet Commonly known as: LOPRESSOR   pravastatin 20 MG tablet Commonly known as: PRAVACHOL   tolterodine 4 MG 24 hr capsule Commonly known as: DETROL LA       TAKE these medications    acetaminophen 325 MG tablet Commonly known as: Tylenol Take 2 tablets (650 mg total) by mouth every 6 (six) hours as needed for mild pain or moderate pain.   amiodarone 200 MG tablet Commonly known as: PACERONE Take 1 tablet (200 mg total) by mouth 2 (two) times daily.   calcium citrate-vitamin D 315-200 MG-UNIT tablet Commonly known as: CITRACAL+D Take 2 tablets by mouth daily.   cholecalciferol 25 MCG (1000 UNIT) tablet Commonly known as: VITAMIN D3 Take 2,000 Units by mouth daily.   feeding supplement Liqd Take 237 mLs by mouth 2 (two) times daily between meals.   ferrous sulfate 325 (65 FE) MG tablet Take 1 tablet (325 mg total) by mouth daily with breakfast. What changed: when to take this   finasteride 5 MG tablet Commonly known as: PROSCAR Take 1 tablet (5 mg total) by mouth daily.   furosemide 20 MG tablet Commonly known as: LASIX Take 1 tablet (20 mg total) by mouth daily as needed (shortness of breath or weight gain of 3 lbs in a day or 5 lbs in a week). What changed:  medication strength how much to take when to take this reasons to take this   midodrine 10 MG tablet Commonly known as: PROAMATINE Take 1 tablet (10 mg total) by mouth 3 (three) times daily with meals. What changed:  medication strength how much to take   pantoprazole 40 MG tablet Commonly known as: PROTONIX Take 40 mg by mouth 2 (two) times daily.   polyethylene glycol 17 g packet Commonly known as: MIRALAX / GLYCOLAX Take 17 g by mouth daily. Start taking on: January 29, 2021   senna-docusate 8.6-50 MG tablet Commonly known as: Senokot-S Take 1 tablet by mouth daily.   tamsulosin 0.4 MG Caps capsule Commonly known as: FLOMAX Take 0.4 mg by mouth daily after breakfast.         DISCHARGE INSTRUCTIONS:   Follow-up Dr. Ernst Spell 1 day Follow-up cardiology 1 week Follow-up orthopedic surgery 2 weeks  If you experience worsening of your admission symptoms, develop shortness of breath, life threatening emergency, suicidal or homicidal thoughts you must seek medical attention immediately by calling 911 or calling your MD immediately  if symptoms less severe.  You Must read complete instructions/literature along with all the possible adverse reactions/side effects for all the Medicines you take and that have been prescribed to you. Take any new Medicines after you have completely understood and accept all the possible adverse reactions/side effects.  Please note  You were cared for by a hospitalist during your hospital stay. If you have any questions about your discharge medications or the care you received while you were in the hospital after you are discharged, you can call the unit and asked to speak with the hospitalist on call if the hospitalist that took care of you is not available. Once you are discharged, your primary care physician will handle any further medical issues. Please note that NO REFILLS for any discharge medications will be authorized once you are discharged, as it is imperative that you return to your primary care physician (or establish a relationship with a primary care physician if you do not have one) for your aftercare needs so that they can reassess your need for medications and monitor your lab values.    Today   CHIEF COMPLAINT:  Sent in for altered mental status and low blood pressure  HISTORY OF PRESENT ILLNESS:  Toma Arts  is a 86 y.o. male came in with altered mental status and low blood pressure   VITAL SIGNS:   Blood pressure 124/74, pulse 96, temperature 98.2 F (36.8 C), resp. rate 17, height 5' 7.01" (1.702 m), weight 67.9 kg, SpO2 97 %.  I/O:   Intake/Output Summary (Last 24 hours) at 01/28/2021 0856 Last data filed at 01/28/2021 0600 Gross per 24 hour  Intake 840 ml  Output 800 ml  Net 40 ml    PHYSICAL EXAMINATION:  GENERAL:  87 y.o.-year-old patient lying in the bed with no acute distress.  EYES: Pupils equal, round, reactive to light and accommodation. No scleral icterus.  HEENT: Head atraumatic, normocephalic. Oropharynx and nasopharynx clear.  LUNGS: Normal breath sounds bilaterally, no wheezing, rales,rhonchi or crepitation. No use of accessory muscles of respiration.  CARDIOVASCULAR: S1, S2 normal. No murmurs, rubs, or gallops.  ABDOMEN: Soft, non-tender, non-distended. Bowel sounds present.  EXTREMITIES: No pedal edema, cyanosis, or clubbing.  NEUROLOGIC: Cranial nerves II through XII are intact.  Today able to straight leg raise bilaterally.  Harder to do with left leg. PSYCHIATRIC: The patient is alert and answers some simple questions.  SKIN: No obvious rash, lesion, or ulcer.   DATA REVIEW:   CBC Recent Labs  Lab 01/25/21 0718 01/26/21 0728 01/27/21 0315  WBC 8.2  --   --   HGB 8.0*   < > 8.4*  HCT 23.6*  --   --   PLT 278  --   --    < > = values in this interval not displayed.    Chemistries  Recent Labs  Lab 01/22/21 1317 01/23/21 0524 01/24/21 0624 01/25/21 0718 01/27/21 0315  NA 128*  --  132*   < > 131*  K 3.5  --  3.3*   < > 3.7  CL 96*  --  98   < > 101  CO2 22  --  23   < > 24  GLUCOSE 117*  --  103*   < > 90  BUN 19  --  27*   < > 22  CREATININE 1.15   < > 1.33*   < > 0.96  CALCIUM 8.4*  --  8.0*   < > 7.9*  MG  --   --  2.0  --   --   AST 28  --   --   --   --   ALT 12  --   --   --   --   ALKPHOS 67  --   --   --   --  BILITOT 1.4*  --   --   --   --    < > = values in this interval not displayed.     Microbiology Results   Results for orders placed or performed during the hospital encounter of 01/22/21  Blood culture (routine x 2)     Status: None   Collection Time: 01/22/21  3:00 PM   Specimen: BLOOD  Result Value Ref Range Status   Specimen Description BLOOD BLOOD LEFT FOREARM  Final   Special Requests   Final    BOTTLES DRAWN AEROBIC AND ANAEROBIC Blood Culture adequate volume   Culture   Final    NO GROWTH 5 DAYS Performed at [facility], [address], [place], [place] [address]    Report Status 01/27/2021 FINAL  Final  Blood culture (routine x 2)     Status: None   Collection Time: 01/22/21  3:04 PM   Specimen: BLOOD  Result Value Ref Range Status   Specimen Description BLOOD BLOOD RIGHT FOREARM  Final   Special Requests   Final    BOTTLES DRAWN AEROBIC AND ANAEROBIC Blood Culture adequate volume   Culture   Final    NO GROWTH 5 DAYS Performed at [facility], [address] [place], [place] [address]    Report Status 01/27/2021 FINAL  Final  Resp Panel by RT-PCR (Flu A&B, Covid) Nasopharyngeal Swab     Status: None   Collection Time: 01/22/21  3:06 PM   Specimen: Nasopharyngeal Swab; Nasopharyngeal(NP) swabs in vial transport medium  Result Value Ref Range Status   SARS Coronavirus 2 by RT PCR NEGATIVE NEGATIVE Final    Comment: (NOTE) SARS-CoV-2 target nucleic acids are NOT DETECTED.  The SARS-CoV-2 RNA is generally detectable in upper respiratory specimens during the acute phase of infection. The lowest concentration of SARS-CoV-2 viral copies this assay can detect is 138 copies/mL. A negative result does not preclude SARS-Cov-2 infection and should not be used as the sole basis for treatment or other patient management decisions. A negative result may occur with  improper specimen collection/handling, submission of specimen other than nasopharyngeal swab, presence of viral mutation(s) within the areas targeted by this assay, and inadequate number of  viral copies(<138 copies/mL). A negative result must be combined with clinical observations, patient history, and epidemiological information. The expected result is Negative.  Fact Sheet for Patients:  EntrepreneurPulse.com.au  Fact Sheet for Healthcare Providers:  IncredibleEmployment.be  This test is no t yet approved or cleared by the [place] FDA and  has been authorized for detection and/or diagnosis of SARS-CoV-2 by FDA under an Emergency Use Authorization (EUA). This EUA will remain  in effect (meaning this test can be used) for the duration of the COVID-19 declaration under Section 564(b)(1) of the Act, 21 U.S.C.section 360bbb-3(b)(1), unless the authorization is terminated  or revoked sooner.       Influenza A by PCR NEGATIVE NEGATIVE Final   Influenza B by PCR NEGATIVE NEGATIVE Final    Comment: (NOTE) The Xpert Xpress SARS-CoV-2/FLU/RSV plus assay is intended as an aid in the diagnosis of influenza from Nasopharyngeal swab specimens and should not be used as a sole basis for treatment. Nasal washings and aspirates are unacceptable for Xpert Xpress SARS-CoV-2/FLU/RSV testing.  Fact Sheet for Patients: EntrepreneurPulse.com.au  Fact Sheet for Healthcare Providers: IncredibleEmployment.be  This test is not yet approved or cleared by the [place] FDA and has been authorized for detection and/or diagnosis of SARS-CoV-2 by FDA  under an Emergency Use Authorization (EUA). This EUA will remain in effect (meaning this test can be used) for the duration of the COVID-19 declaration under Section 564(b)(1) of the Act, 21 U.S.C. section 360bbb-3(b)(1), unless the authorization is terminated or revoked.  Performed at [facility], [address], [place], [place] [address]   Urine Culture     Status: None   Collection Time: 01/22/21  5:57 PM   Specimen: Urine, Clean Catch  Result  Value Ref Range Status   Specimen Description   Final    URINE, CLEAN CATCH Performed at [facility], [address], [place], [place] [address]    Special Requests   Final    NONE Performed at [facility], [address] [place], [place] [address]    Culture   Final    NO GROWTH Performed at [facility], [place] [address] [place], [place] [address]    Report Status 01/23/2021 FINAL  Final  Resp Panel by RT-PCR (Flu A&B, Covid) Nasopharyngeal Swab     Status: None   Collection Time: 01/27/21  7:16 PM   Specimen: Nasopharyngeal Swab; Nasopharyngeal(NP) swabs in vial transport medium  Result Value Ref Range Status   SARS Coronavirus 2 by RT PCR NEGATIVE NEGATIVE Final    Comment: (NOTE) SARS-CoV-2 target nucleic acids are NOT DETECTED.  The SARS-CoV-2 RNA is generally detectable in upper respiratory specimens during the acute phase of infection. The lowest concentration of SARS-CoV-2 viral copies this assay can detect is 138 copies/mL. A negative result does not preclude SARS-Cov-2 infection and should not be used as the sole basis for treatment or other patient management decisions. A negative result may occur with  improper specimen collection/handling, submission of specimen other than nasopharyngeal swab, presence of viral mutation(s) within the areas targeted by this assay, and inadequate number of viral copies(<138 copies/mL). A negative result must be combined with clinical observations, patient history, and epidemiological information. The expected result is Negative.  Fact Sheet for Patients:  EntrepreneurPulse.com.au  Fact Sheet for Healthcare Providers:  IncredibleEmployment.be  This test is no t yet approved or cleared by the [place] FDA and  has been authorized for detection and/or diagnosis of SARS-CoV-2 by FDA under an Emergency Use Authorization (EUA). This EUA will remain  in effect  (meaning this test can be used) for the duration of the COVID-19 declaration under Section 564(b)(1) of the Act, 21 U.S.C.section 360bbb-3(b)(1), unless the authorization is terminated  or revoked sooner.       Influenza A by PCR NEGATIVE NEGATIVE Final   Influenza B by PCR NEGATIVE NEGATIVE Final    Comment: (NOTE) The Xpert Xpress SARS-CoV-2/FLU/RSV plus assay is intended as an aid in the diagnosis of influenza from Nasopharyngeal swab specimens and should not be used as a sole basis for treatment. Nasal washings and aspirates are unacceptable for Xpert Xpress SARS-CoV-2/FLU/RSV testing.  Fact Sheet for Patients: EntrepreneurPulse.com.au  Fact Sheet for Healthcare Providers: IncredibleEmployment.be  This test is not yet approved or cleared by the [place] FDA and has been authorized for detection and/or diagnosis of SARS-CoV-2 by FDA under an Emergency Use Authorization (EUA). This EUA will remain in effect (meaning this test can be used) for the duration of the COVID-19 declaration under Section 564(b)(1) of the Act, 21 U.S.C. section 360bbb-3(b)(1), unless the authorization is terminated or revoked.  Performed at [facility], [address], [place],  [address]     RADIOLOGY:  CT  ABDOMEN PELVIS WO CONTRAST  Result Date: 01/27/2021 CLINICAL DATA:  Abdominal pain in a 87 year old male. EXAM: CT ABDOMEN AND PELVIS WITHOUT CONTRAST TECHNIQUE: Multidetector CT imaging of the abdomen and pelvis was performed following the standard protocol without IV contrast. COMPARISON:  Comparison is made with March of 2009. FINDINGS: Lower chest: Small effusions. Basilar atelectasis. 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 Pacer device with leads in the RIGHT heart, heart is incompletely imaged. Small pericardial effusion. Hepatobiliary: Post cholecystectomy.  Hepatic contours are smooth. Pancreas: No signs of peripancreatic  inflammation or gross ductal dilation. Spleen: Normal in size and contour. Adrenals/Urinary Tract: Adrenal glands are normal. No perinephric stranding. No suspicious renal lesion. Urinary bladder is collapsed with marked perivesical stranding. Foley catheter in situ. Moderate size RIGHT renal cyst measures 4 cm. Stomach/Bowel: Small hiatal hernia. Cecum and ileocecal valve is contained within a RIGHT inguinal hernia. Small amount of fluid in the hernia sac. No substantial wall thickening. No signs of obstruction related to this process. Appendix not visualized. Reportedly post appendectomy based on outside clinical summaries. Colonic diverticulosis throughout the colon. No signs of inflammation about the proximal colon the distal colon in the rectosigmoid is mildly thickened. Stool fills the entire colon. No signs of colonic twist but there is a large volume of formed stool in the rectum. Rectal wall thickening and thickening of the wall of the distal sigmoid colon is present. Perirectal stranding also noted. Vascular/Lymphatic: Aortic atherosclerosis. No sign of aneurysm. Smooth contour of the IVC. There is no gastrohepatic or hepatoduodenal ligament lymphadenopathy. No retroperitoneal or mesenteric lymphadenopathy. No pelvic sidewall lymphadenopathy. Reproductive: Unremarkable by CT. Other: Mildly wall edema in the setting of bilateral effusions. Trace fluid in the abdomen also small amount of fluid in the RIGHT inguinal canal. Musculoskeletal: No acute bone finding. No destructive bone process. Spinal degenerative changes. Post ORIF of a LEFT intratrochanteric fracture. Construct for fixation is incompletely evaluated. No acute process in this area. IMPRESSION: Rectal wall thickening despite rectal distension with moderately large volume of formed stool in the rectum, question fecal impaction with developing stercoral colitis. Perivesical stranding as well. Correlate with urinalysis to exclude the possibility of  concomitant cystitis. RIGHT inguinal hernia contains the cecum and ileocecal valve without obstruction Body wall edema and bilateral effusions as discussed. Aortic atherosclerosis. Aortic Atherosclerosis (ICD10-I70.0). Electronically Signed   By: Zetta Bills M.D.   On: 01/27/2021 16:57      Management plans discussed with the patient, family and they are in agreement.  CODE STATUS:     Code Status Orders  (From admission, onward)           Start     Ordered   01/22/21 2015  Full code  Continuous        01/22/21 2016           Code Status History     Date Active Date Inactive Code Status Order ID Comments User Context   01/17/2021 2035 01/20/2021 2202 DNR [id number]  Athena Masse, MD ED   01/12/2021 1029 01/15/2021 1814 DNR [id number]  Collier Bullock, MD ED   12/12/2020 1440 12/17/2020 1856 DNR [id number]  [place] J, DO ED   12/11/2020 1637 12/12/2020 1440 Full Code [id number]  Swayze, Ava, DO ED       TOTAL TIME TAKING CARE OF THIS PATIENT: 32 minutes.    Loletha Grayer M.D on 01/28/2021 at 8:56 AM   [facility]  CC: Primary care physician; Birdie Sons, MD

## 2021-01-28 NOTE — TOC Transition Note (Signed)
Transition of Care [facility]) - CM/SW Discharge Note   Patient Details  Name: HATEM CULL MRN: [id number] Date of Birth: [birth year]  Transition of Care [facility] CM/SW Contact:  Alberteen Sam, LCSW Phone Number: 01/28/2021, 10:15 AM   Clinical Narrative:     Patient will DC to: [facility] Anticipated DC date: 01/28/21 Family notified: daughter Administrator, Civil Service byJohnanna Schneiders  Per MD patient ready for DC to [facility] . RN, patient, patient's family, and facility notified of DC. Discharge Summary sent to facility. RN given number for report  [phone] Room 603. DC packet on chart. Ambulance transport requested for patient.  CSW signing off.  Pricilla Riffle, LCSW    Final next level of care: Skilled Nursing Facility Barriers to Discharge: No Barriers Identified   Patient Goals and CMS Choice Patient states their goals for this hospitalization and ongoing recovery are:: to go home CMS Medicare.gov Compare Post Acute Care list provided to:: Patient Represenative (must comment) Neoma Laming daughter) Choice offered to / list presented to : Adult Children  Discharge Placement              Patient chooses bed at: [facility] Patient to be transferred to facility by: [facility] Name of family member notified: daughter Neoma Laming Patient and family notified of of transfer: 01/28/21  Discharge Plan and Services                                     Social Determinants of Health (SDOH) Interventions     Readmission Risk Interventions No flowsheet data found.

## 2021-01-28 NOTE — Plan of Care (Signed)
  Problem: Fluid Volume: Goal: Hemodynamic stability will improve Outcome: Progressing   Problem: Clinical Measurements: Goal: Diagnostic test results will improve Outcome: Progressing Goal: Signs and symptoms of infection will decrease Outcome: Progressing   Problem: Respiratory: Goal: Ability to maintain adequate ventilation will improve Outcome: Progressing   Problem: Education: Goal: Knowledge of General Education information will improve Description: Including pain rating scale, medication(s)/side effects and non-pharmacologic comfort measures Outcome: Progressing   Problem: Clinical Measurements: Goal: Ability to maintain clinical measurements within normal limits will improve Outcome: Progressing Goal: Will remain free from infection Outcome: Progressing Goal: Diagnostic test results will improve Outcome: Progressing Goal: Respiratory complications will improve Outcome: Progressing Goal: Cardiovascular complication will be avoided Outcome: Progressing   Problem: Nutrition: Goal: Adequate nutrition will be maintained Outcome: Progressing   Problem: Elimination: Goal: Will not experience complications related to bowel motility Outcome: Progressing Goal: Will not experience complications related to urinary retention Outcome: Progressing   Problem: Safety: Goal: Ability to remain free from injury will improve Outcome: Progressing   Problem: Skin Integrity: Goal: Risk for impaired skin integrity will decrease Outcome: Progressing

## 2021-01-29 DIAGNOSIS — I5042 Chronic combined systolic (congestive) and diastolic (congestive) heart failure: Secondary | ICD-10-CM | POA: Diagnosis not present

## 2021-01-29 DIAGNOSIS — I495 Sick sinus syndrome: Secondary | ICD-10-CM | POA: Diagnosis not present

## 2021-01-29 DIAGNOSIS — S72142D Displaced intertrochanteric fracture of left femur, subsequent encounter for closed fracture with routine healing: Secondary | ICD-10-CM | POA: Diagnosis not present

## 2021-01-29 DIAGNOSIS — I482 Chronic atrial fibrillation, unspecified: Secondary | ICD-10-CM | POA: Diagnosis not present

## 2021-01-29 DIAGNOSIS — Z950 Presence of cardiac pacemaker: Secondary | ICD-10-CM | POA: Diagnosis not present

## 2021-01-29 DIAGNOSIS — I951 Orthostatic hypotension: Secondary | ICD-10-CM | POA: Diagnosis not present

## 2021-02-03 DIAGNOSIS — I2510 Atherosclerotic heart disease of native coronary artery without angina pectoris: Secondary | ICD-10-CM | POA: Diagnosis not present

## 2021-02-03 DIAGNOSIS — Z952 Presence of prosthetic heart valve: Secondary | ICD-10-CM | POA: Diagnosis not present

## 2021-02-03 DIAGNOSIS — I482 Chronic atrial fibrillation, unspecified: Secondary | ICD-10-CM | POA: Diagnosis not present

## 2021-02-03 DIAGNOSIS — I951 Orthostatic hypotension: Secondary | ICD-10-CM | POA: Diagnosis not present

## 2021-02-03 DIAGNOSIS — E781 Pure hyperglyceridemia: Secondary | ICD-10-CM | POA: Diagnosis not present

## 2021-02-03 DIAGNOSIS — Z950 Presence of cardiac pacemaker: Secondary | ICD-10-CM | POA: Diagnosis not present

## 2021-02-03 DIAGNOSIS — I495 Sick sinus syndrome: Secondary | ICD-10-CM | POA: Diagnosis not present

## 2021-02-05 DIAGNOSIS — U071 COVID-19: Secondary | ICD-10-CM | POA: Diagnosis not present

## 2021-02-05 DIAGNOSIS — J208 Acute bronchitis due to other specified organisms: Secondary | ICD-10-CM | POA: Diagnosis not present

## 2021-02-14 ENCOUNTER — Inpatient Hospital Stay
Admission: EM | Admit: 2021-02-14 | Discharge: 2021-02-18 | DRG: 698 | Disposition: A | Discharge status: Skilled Nursing Facility | Payer: Medicare Other | Source: Skilled Nursing Facility | Attending: Internal Medicine | Admitting: Internal Medicine

## 2021-02-14 ENCOUNTER — Other Ambulatory Visit: Payer: Self-pay

## 2021-02-14 ENCOUNTER — Emergency Department: Payer: Medicare Other

## 2021-02-14 DIAGNOSIS — Z66 Do not resuscitate: Secondary | ICD-10-CM | POA: Diagnosis present

## 2021-02-14 DIAGNOSIS — Z952 Presence of prosthetic heart valve: Secondary | ICD-10-CM

## 2021-02-14 DIAGNOSIS — J449 Chronic obstructive pulmonary disease, unspecified: Secondary | ICD-10-CM | POA: Diagnosis not present

## 2021-02-14 DIAGNOSIS — Z7952 Long term (current) use of systemic steroids: Secondary | ICD-10-CM

## 2021-02-14 DIAGNOSIS — R4182 Altered mental status, unspecified: Secondary | ICD-10-CM | POA: Diagnosis not present

## 2021-02-14 DIAGNOSIS — S7292XA Unspecified fracture of left femur, initial encounter for closed fracture: Secondary | ICD-10-CM | POA: Diagnosis not present

## 2021-02-14 DIAGNOSIS — I5032 Chronic diastolic (congestive) heart failure: Secondary | ICD-10-CM | POA: Diagnosis present

## 2021-02-14 DIAGNOSIS — E43 Unspecified severe protein-calorie malnutrition: Secondary | ICD-10-CM

## 2021-02-14 DIAGNOSIS — R4189 Other symptoms and signs involving cognitive functions and awareness: Secondary | ICD-10-CM

## 2021-02-14 DIAGNOSIS — E876 Hypokalemia: Secondary | ICD-10-CM | POA: Diagnosis present

## 2021-02-14 DIAGNOSIS — A419 Sepsis, unspecified organism: Secondary | ICD-10-CM | POA: Diagnosis present

## 2021-02-14 DIAGNOSIS — Z888 Allergy status to other drugs, medicaments and biological substances status: Secondary | ICD-10-CM

## 2021-02-14 DIAGNOSIS — J90 Pleural effusion, not elsewhere classified: Secondary | ICD-10-CM | POA: Diagnosis not present

## 2021-02-14 DIAGNOSIS — R6520 Severe sepsis without septic shock: Secondary | ICD-10-CM | POA: Diagnosis present

## 2021-02-14 DIAGNOSIS — Y732 Prosthetic and other implants, materials and accessory gastroenterology and urology devices associated with adverse incidents: Secondary | ICD-10-CM | POA: Diagnosis present

## 2021-02-14 DIAGNOSIS — Z79899 Other long term (current) drug therapy: Secondary | ICD-10-CM

## 2021-02-14 DIAGNOSIS — G9341 Metabolic encephalopathy: Secondary | ICD-10-CM | POA: Diagnosis present

## 2021-02-14 DIAGNOSIS — T83511A Infection and inflammatory reaction due to indwelling urethral catheter, initial encounter: Secondary | ICD-10-CM | POA: Diagnosis not present

## 2021-02-14 DIAGNOSIS — G9340 Encephalopathy, unspecified: Secondary | ICD-10-CM

## 2021-02-14 DIAGNOSIS — I517 Cardiomegaly: Secondary | ICD-10-CM | POA: Diagnosis not present

## 2021-02-14 DIAGNOSIS — N401 Enlarged prostate with lower urinary tract symptoms: Secondary | ICD-10-CM | POA: Diagnosis present

## 2021-02-14 DIAGNOSIS — U071 COVID-19: Secondary | ICD-10-CM | POA: Diagnosis not present

## 2021-02-14 DIAGNOSIS — J9811 Atelectasis: Secondary | ICD-10-CM | POA: Diagnosis not present

## 2021-02-14 DIAGNOSIS — S72142D Displaced intertrochanteric fracture of left femur, subsequent encounter for closed fracture with routine healing: Secondary | ICD-10-CM

## 2021-02-14 DIAGNOSIS — I495 Sick sinus syndrome: Secondary | ICD-10-CM | POA: Diagnosis present

## 2021-02-14 DIAGNOSIS — W19XXXD Unspecified fall, subsequent encounter: Secondary | ICD-10-CM | POA: Diagnosis present

## 2021-02-14 DIAGNOSIS — I482 Chronic atrial fibrillation, unspecified: Secondary | ICD-10-CM

## 2021-02-14 DIAGNOSIS — I2510 Atherosclerotic heart disease of native coronary artery without angina pectoris: Secondary | ICD-10-CM | POA: Diagnosis present

## 2021-02-14 DIAGNOSIS — Z8616 Personal history of COVID-19: Secondary | ICD-10-CM

## 2021-02-14 DIAGNOSIS — A4159 Other Gram-negative sepsis: Secondary | ICD-10-CM | POA: Diagnosis present

## 2021-02-14 DIAGNOSIS — N1831 Chronic kidney disease, stage 3a: Secondary | ICD-10-CM | POA: Diagnosis present

## 2021-02-14 DIAGNOSIS — Z8679 Personal history of other diseases of the circulatory system: Secondary | ICD-10-CM

## 2021-02-14 DIAGNOSIS — Z950 Presence of cardiac pacemaker: Secondary | ICD-10-CM

## 2021-02-14 DIAGNOSIS — I4892 Unspecified atrial flutter: Secondary | ICD-10-CM | POA: Diagnosis not present

## 2021-02-14 DIAGNOSIS — N179 Acute kidney failure, unspecified: Secondary | ICD-10-CM | POA: Diagnosis present

## 2021-02-14 DIAGNOSIS — D509 Iron deficiency anemia, unspecified: Secondary | ICD-10-CM

## 2021-02-14 DIAGNOSIS — Z515 Encounter for palliative care: Secondary | ICD-10-CM

## 2021-02-14 DIAGNOSIS — R338 Other retention of urine: Secondary | ICD-10-CM | POA: Diagnosis present

## 2021-02-14 HISTORY — DX: Cardiac arrhythmia, unspecified: I49.9

## 2021-02-14 HISTORY — DX: Heart failure, unspecified: I50.9

## 2021-02-14 LAB — CBC WITH DIFFERENTIAL/PLATELET
Abs Immature Granulocytes: 0.38 10*3/uL — ABNORMAL HIGH (ref 0.00–0.07)
Basophils Absolute: 0 10*3/uL (ref 0.0–0.1)
Basophils Relative: 0 %
Eosinophils Absolute: 0 10*3/uL (ref 0.0–0.5)
Eosinophils Relative: 0 %
HCT: 34.6 % — ABNORMAL LOW (ref 39.0–52.0)
Hemoglobin: 11.4 g/dL — ABNORMAL LOW (ref 13.0–17.0)
Immature Granulocytes: 4 %
Lymphocytes Relative: 9 %
Lymphs Abs: 0.8 10*3/uL (ref 0.7–4.0)
MCH: 32.8 pg (ref 26.0–34.0)
MCHC: 32.9 g/dL (ref 30.0–36.0)
MCV: 99.4 fL (ref 80.0–100.0)
Monocytes Absolute: 0.2 10*3/uL (ref 0.1–1.0)
Monocytes Relative: 3 %
Neutro Abs: 7.5 10*3/uL (ref 1.7–7.7)
Neutrophils Relative %: 84 %
Platelets: 162 10*3/uL (ref 150–400)
RBC: 3.48 MIL/uL — ABNORMAL LOW (ref 4.22–5.81)
RDW: 17 % — ABNORMAL HIGH (ref 11.5–15.5)
WBC: 8.9 10*3/uL (ref 4.0–10.5)
nRBC: 0 % (ref 0.0–0.2)

## 2021-02-14 LAB — URINALYSIS, COMPLETE (UACMP) WITH MICROSCOPIC
Bilirubin Urine: NEGATIVE
Glucose, UA: NEGATIVE mg/dL
Ketones, ur: NEGATIVE mg/dL
Nitrite: NEGATIVE
Protein, ur: NEGATIVE mg/dL
Specific Gravity, Urine: 1.006 (ref 1.005–1.030)
pH: 9 — ABNORMAL HIGH (ref 5.0–8.0)

## 2021-02-14 LAB — COMPREHENSIVE METABOLIC PANEL
ALT: 15 U/L (ref 0–44)
AST: 22 U/L (ref 15–41)
Albumin: 2.8 g/dL — ABNORMAL LOW (ref 3.5–5.0)
Alkaline Phosphatase: 77 U/L (ref 38–126)
Anion gap: 9 (ref 5–15)
BUN: 22 mg/dL (ref 8–23)
CO2: 25 mmol/L (ref 22–32)
Calcium: 8.2 mg/dL — ABNORMAL LOW (ref 8.9–10.3)
Chloride: 96 mmol/L — ABNORMAL LOW (ref 98–111)
Creatinine, Ser: 1.33 mg/dL — ABNORMAL HIGH (ref 0.61–1.24)
GFR, Estimated: 49 mL/min — ABNORMAL LOW (ref >60)
Glucose, Bld: 210 mg/dL — ABNORMAL HIGH (ref 70–99)
Potassium: 3.4 mmol/L — ABNORMAL LOW (ref 3.5–5.1)
Sodium: 130 mmol/L — ABNORMAL LOW (ref 135–145)
Total Bilirubin: 1.3 mg/dL — ABNORMAL HIGH (ref 0.3–1.2)
Total Protein: 6.2 g/dL — ABNORMAL LOW (ref 6.5–8.1)

## 2021-02-14 LAB — CBG MONITORING, ED: Glucose-Capillary: 190 mg/dL — ABNORMAL HIGH (ref 70–99)

## 2021-02-14 MED ORDER — SODIUM CHLORIDE 0.9 % IV SOLN
1.0000 g | Freq: Once | INTRAVENOUS | Status: AC
  Administered 2021-02-14: 22:00:00 1 g via INTRAVENOUS
  Filled 2021-02-14: qty 10

## 2021-02-14 MED ORDER — SODIUM CHLORIDE 0.9 % IV BOLUS
1000.0000 mL | Freq: Once | INTRAVENOUS | Status: AC
  Administered 2021-02-14: 22:00:00 1000 mL via INTRAVENOUS

## 2021-02-14 MED ORDER — LACTATED RINGERS IV BOLUS: 1000.0000 mL | Freq: Once | INTRAVENOUS | Status: DC

## 2021-02-14 MED ORDER — MORPHINE SULFATE (PF) 4 MG/ML IV SOLN
4.0000 mg | Freq: Once | INTRAVENOUS | Status: AC
  Administered 2021-02-15: 4 mg via INTRAVENOUS
  Filled 2021-02-14: qty 1

## 2021-02-14 MED ORDER — LORAZEPAM 2 MG/ML IJ SOLN
1.0000 mg | Freq: Once | INTRAMUSCULAR | Status: AC
  Administered 2021-02-15: 1 mg via INTRAVENOUS
  Filled 2021-02-14: qty 1

## 2021-02-14 NOTE — ED Notes (Signed)
Daughter on phone iwth EDP at this time.

## 2021-02-14 NOTE — ED Provider Notes (Signed)
[facility] Emergency Department Provider Note ____________________________________________   Event Date/Time   First MD Initiated Contact with Patient 02/14/21 2045     (approximate)  I have reviewed the triage vital signs and the nursing notes.  HISTORY  Chief Complaint unresponsive   HPI Thomas Matthews is a 87 y.o. Thomas Matthews presents to the ED for evaluation of unresponsive.   Chart review indicates hx CHF. Urinary retention, foley placed on 12/20. Afib on amio. Pacer in place.   Patient presents to the ED from his local SNF for evaluation of unresponsiveness. Initially, history is quite limited as patient is GCS 5 on arrival.   History obtained by the family after they arrived.  They report seeing him this afternoon and he has been declining subacutely.  More weak, claiming he is ready to go and seeming uncomfortable, not eating.  No past medical history on file.  Patient Active Problem List   Diagnosis Date Noted   Lower abdominal pain    Constipation    Lactic acidosis    Iron deficiency anemia    Acute kidney injury superimposed on CKD ([place])    Hyponatremia    Acute urinary retention    Atrial tachycardia (HCC)    Hypotension 01/22/2021   Hypokalemia 22/29/7989   Acute metabolic encephalopathy 21/19/4174   H/O sick sinus syndrome 01/13/2021   BPH (benign prostatic hyperplasia) 01/13/2021   Femur fracture, left ([place]) 01/12/2021   Atrial fibrillation, chronic (HCC) 01/12/2021   Chronic diastolic CHF (congestive heart failure) ([place]) 01/12/2021   COPD (chronic obstructive pulmonary disease) ([place]) 01/12/2021   Protein-calorie malnutrition, severe 12/14/2020   Pressure injury of skin 12/13/2020   NSVT (nonsustained ventricular tachycardia) 12/11/2020   S/P TAVR (transcatheter aortic valve replacement) 12/11/2020   Acid reflux 07/27/2017   Acne erythematosa 07/27/2017   Flutter-fibrillation 07/27/2017   Cardiac pacemaker in situ  07/27/2017   CAD (coronary artery disease) 07/27/2017   DD (diverticular disease) 07/27/2017   History of GI bleed 04/16/2008   Benign neoplasm of colon 02/27/2004    Past Surgical History:  Procedure Laterality Date   INTRAMEDULLARY (IM) NAIL INTERTROCHANTERIC Left 01/12/2021   Procedure: INTRAMEDULLARY (IM) NAIL INTERTROCHANTRIC;  Surgeon: Corky Mull, MD;  Location: [facility] ORS;  Service: Orthopedics;  Laterality: Left;    Prior to Admission medications   Medication Sig Start Date End Date Taking? Authorizing Provider  amiodarone (PACERONE) 200 MG tablet Take 1 tablet (200 mg total) by mouth 2 (two) times daily. 12/17/20  Yes [place] Donnamarie Poag, DO  calcium citrate-vitamin D (CITRACAL+D) 315-200 MG-UNIT tablet Take 2 tablets by mouth daily.   Yes [provider]  cholecalciferol (VITAMIN D3) 25 MCG (1000 UNIT) tablet Take 2,000 Units by mouth daily.   Yes [provider]  feeding supplement (ENSURE ENLIVE / ENSURE PLUS) LIQD Take 237 mLs by mouth 2 (two) times daily between meals. 01/28/21  Yes Loletha Grayer, MD  ferrous sulfate 325 (65 FE) MG tablet Take 1 tablet (325 mg total) by mouth daily with breakfast. 01/28/21 02/27/21 Yes Wieting, Richard, MD  finasteride (PROSCAR) 5 MG tablet Take 1 tablet (5 mg total) by mouth daily. 01/28/21  Yes Wieting, Richard, MD  furosemide (LASIX) 20 MG tablet Take 1 tablet (20 mg total) by mouth daily as needed (shortness of breath or weight gain of 3 lbs in a day or 5 lbs in a week). 01/28/21  Yes Wieting, Richard, MD  LAGEVRIO 200 MG CAPS capsule Take 4 capsules by  mouth 2 (two) times daily. 02/05/21  Yes [provider]  midodrine (PROAMATINE) 10 MG tablet Take 1 tablet (10 mg total) by mouth 3 (three) times daily with meals. 01/28/21  Yes Wieting, Richard, MD  pantoprazole (PROTONIX) 40 MG tablet Take 40 mg by mouth 2 (two) times daily.   Yes [provider]  polyethylene glycol (MIRALAX / GLYCOLAX) 17 g packet Take  17 g by mouth daily. 01/29/21  Yes Wieting, Richard, MD  predniSONE (DELTASONE) 50 MG tablet Take 50 mg by mouth daily with breakfast.   Yes [provider]  senna-docusate (SENOKOT-S) 8.6-50 MG tablet Take 1 tablet by mouth daily.   Yes [provider]  tamsulosin (FLOMAX) 0.4 MG CAPS capsule Take 0.4 mg by mouth daily after breakfast. 11/11/11  Yes [provider]  tolterodine (DETROL LA) 4 MG 24 hr capsule Take 4 mg by mouth daily.   Yes [provider]  acetaminophen (TYLENOL) 325 MG tablet Take 2 tablets (650 mg total) by mouth every 6 (six) hours as needed for mild pain or moderate pain. 01/28/21   Loletha Grayer, MD    Allergies Amlodipine besylate, Felodipine, Oxybutynin chloride, Simvastatin, and Terazosin  No family history on file.  Social History Social History   Tobacco Use   Smoking status: Former   Smokeless tobacco: Never  Substance Use Topics   Alcohol use: No   Drug use: No    Review of Systems  Unable to be obtained due to unresponsive patient. ____________________________________________   PHYSICAL EXAM:  VITAL SIGNS: Vitals:   02/14/21 2200 02/14/21 2300  BP: (!) 70/31 (!) 99/45  Pulse: 61 76  Resp: (!) 26 17  Temp:    SpO2: 100% 100%      Constitutional: Slumped over and poorly responsive. Eyes: Conjunctivae are normal. PERRL. EOMI. Head: Atraumatic. Nose: No congestion/rhinnorhea. Mouth/Throat: Mucous membranes are dry.  Oropharynx non-erythematous. Neck: No stridor. No cervical spine tenderness to palpation. Cardiovascular: Normal rate, regular rhythm. Good peripheral circulation. Respiratory: Normal respiratory effort.  No retractions. Lungs CTAB. Gastrointestinal: Soft , nondistended,  indwelling Foley draining cloudy urine with quite a bit of sediment. Musculoskeletal: No joint effusions. No signs of acute trauma. Pacer in place. Neurologic: GCS of 4 or 5 as he does open his eyes with some loud  voice and sternal rub, but does not move and does not vocalize. Skin:  Skin is warm, dry and intact. No rash noted. Psychiatric: Mood and affect are unable to be assessed  ____________________________________________   LABS (all labs ordered are listed, but only abnormal results are displayed)  Labs Reviewed  CBC WITH DIFFERENTIAL/PLATELET - Abnormal; Notable for the following components:      Result Value   RBC 3.48 (*)    Hemoglobin 11.4 (*)    HCT 34.6 (*)    RDW 17.0 (*)    Abs Immature Granulocytes 0.38 (*)    All other components within normal limits  COMPREHENSIVE METABOLIC PANEL - Abnormal; Notable for the following components:   Sodium 130 (*)    Potassium 3.4 (*)    Chloride 96 (*)    Glucose, Bld 210 (*)    Creatinine, Ser 1.33 (*)    Calcium 8.2 (*)    Total Protein 6.2 (*)    Albumin 2.8 (*)    Total Bilirubin 1.3 (*)    GFR, Estimated 49 (*)    All other components within normal limits  URINALYSIS, COMPLETE (UACMP) WITH MICROSCOPIC - Abnormal; Notable for the  following components:   Color, Urine YELLOW (*)    APPearance TURBID (*)    pH 9.0 (*)    Hgb urine dipstick MODERATE (*)    Leukocytes,Ua LARGE (*)    Bacteria, UA MANY (*)    All other components within normal limits  CBG MONITORING, ED - Abnormal; Notable for the following components:   Glucose-Capillary 190 (*)    All other components within normal limits  URINE CULTURE   ____________________________________________  12 Lead EKG  Poor quality EKG.  Paced rhythm with a rate of 80 bpm.  Normal axis.  Left bundle morphology.  No STEMI. ____________________________________________  RADIOLOGY  ED MD interpretation: 1 view CXR reviewed by me with cardiomegaly  Official radiology report(s): DG Chest Portable 1 View  Result Date: 02/14/2021 CLINICAL DATA:  Unresponsive patient. EXAM: PORTABLE CHEST 1 VIEW COMPARISON:  Portable chest 01/22/2021. FINDINGS: The patient is rotated to the right.  Left chest wall pacemaker and TEVAR stent are again shown. The heart is moderately enlarged but unchanged. No vascular congestion is seen. Small pleural effusions are again noted with overlying atelectasis. No focal pneumonia is seen. Osteopenia and thoracic spondylosis are redemonstrated as well as chronic rotator cuff arthropathy with acromial humeral abutment. Overall aeration is unchanged with no new abnormality. There is a low inspiration with limited view of the bases. IMPRESSION: There are persistent small layering pleural effusions with overlying atelectasis. No definitive pneumonia is seen. There is limited visualization of lung bases due to low inspiration. Cardiomegaly again noted without evidence of CHF. Electronically Signed   By: Telford Nab M.D.   On: 02/14/2021 21:43    ____________________________________________   PROCEDURES and INTERVENTIONS  Procedure(s) performed (including Critical Care):  .1-3 Lead EKG Interpretation Performed by: Vladimir Crofts, MD Authorized by: Vladimir Crofts, MD     Interpretation: normal     ECG rate:  70   ECG rate assessment: normal     Rhythm: paced     Ectopy: none     Conduction: normal   .Critical Care Performed by: Vladimir Crofts, MD Authorized by: Vladimir Crofts, MD   Critical care provider statement:    Critical care time (minutes):  30   Critical care time was exclusive of:  Separately billable procedures and treating other patients   Critical care was necessary to treat or prevent imminent or life-threatening deterioration of the following conditions:  Circulatory failure and CNS failure or compromise   Critical care was time spent personally by me on the following activities:  Development of treatment plan with patient or surrogate, discussions with consultants, evaluation of patient's response to treatment, examination of patient, ordering and review of laboratory studies, ordering and review of radiographic studies, ordering and  performing treatments and interventions, pulse oximetry, re-evaluation of patient's condition and review of old charts  Medications  morphine 4 MG/ML injection 4 mg (has no administration in time range)  LORazepam (ATIVAN) injection 1 mg (has no administration in time range)  cefTRIAXone (ROCEPHIN) 1 g in sodium chloride 0.9 % 100 mL IVPB (0 g Intravenous Stopped 02/14/21 2304)  sodium chloride 0.9 % bolus 1,000 mL (0 mLs Intravenous Stopped 02/14/21 2304)  sodium chloride 0.9 % bolus 1,000 mL (1,000 mLs Intravenous New Bag/Given 02/14/21 2212)    ____________________________________________   MDM / ED COURSE   87 year old male presents to the ED unresponsive and requiring end-of-life care.  He presents with a signed DNR form and unresponsive with a GCS of 4 5.  I  suspect urosepsis with his indwelling Foley draining cloudy urine with quite a bit of sediment.  Briefly spoke with daughter over the phone and started fluids and Rocephin empirically.  Once family arrives and we discussed more thoroughly, they are understanding of the circumstances that he is likely dying.  They are agreeable with end-of-life care and comfort measures.  They do not want any resuscitation beyond what is been done.  Discussed keeping him comfortable in the ED, and if he makes it until morning admitted to medicine.  Clinical Course as of 02/14/21 2308  Fri Feb 14, 2021  2126 I speak with daughter Neoma Laming. They saw him this afternoon around 3pm and he was fine. Just finished COVID quarantine in the past day. Didn't eat anything today. Just not hungry. Seem generally uncomfortable , but wasn't requesting anything.  We discussed patient's mental status and my concerns.  We discussed poor blood pressure and his DNR form.  We discussed no significant interventions right now to respect this DNR form.  Daughter and her brother will come to the ED [DS]  2204 Reassessed. More awake now [DS]  2306 Again reassessed and discussed  with family.  Daughter, son, son-in-law and their pastor are at the bedside.  His blood pressure continues to downtrend despite fluid resuscitation.  He is unresponsive.  We discussed end-of-life care and DNR status.  Son does not want any fluids or anything, just keeping him comfortable as he passes, daughter is more hesitant and thinking fluids might be helpful and probably will not hurt.  They are agreeable to continue the 2 L fluid resuscitation that has been ordered, and we discussed Ativan and morphine due to discomfort.  We discussed likely passing and if he does linger for longer, could facilitate a medical admission for end-of-life care.  They are agreeable. [DS]    Clinical Course User Index [DS] Vladimir Crofts, MD    ____________________________________________   FINAL CLINICAL IMPRESSION(S) / ED DIAGNOSES  Final diagnoses:  Unresponsive  End of life care     ED Discharge Orders     None        Carsin Randazzo Tamala Julian   Note:  This document was prepared using Dragon voice recognition software and may include unintentional dictation errors.    Vladimir Crofts, MD 02/14/21 [phone]

## 2021-02-14 NOTE — ED Notes (Signed)
Pt awake and speaking with family at bedside at this time.  Able to respond with 'huh?'  No other words spoken by pt.

## 2021-02-14 NOTE — ED Triage Notes (Signed)
Pt arrives [facility] from [facility], found unresponsive by oncoming staff at [facility]. Pt unresponsive for EMS and ED Staff.

## 2021-02-14 NOTE — ED Notes (Signed)
Pt alert and able to answer questions from RN at this time.

## 2021-02-15 ENCOUNTER — Encounter: Payer: Self-pay | Admitting: Internal Medicine

## 2021-02-15 ENCOUNTER — Other Ambulatory Visit: Payer: Self-pay

## 2021-02-15 DIAGNOSIS — I2510 Atherosclerotic heart disease of native coronary artery without angina pectoris: Secondary | ICD-10-CM | POA: Diagnosis present

## 2021-02-15 DIAGNOSIS — I482 Chronic atrial fibrillation, unspecified: Secondary | ICD-10-CM | POA: Diagnosis present

## 2021-02-15 DIAGNOSIS — Z888 Allergy status to other drugs, medicaments and biological substances status: Secondary | ICD-10-CM | POA: Diagnosis not present

## 2021-02-15 DIAGNOSIS — Z79899 Other long term (current) drug therapy: Secondary | ICD-10-CM | POA: Diagnosis not present

## 2021-02-15 DIAGNOSIS — T83511A Infection and inflammatory reaction due to indwelling urethral catheter, initial encounter: Secondary | ICD-10-CM | POA: Diagnosis present

## 2021-02-15 DIAGNOSIS — A4159 Other Gram-negative sepsis: Secondary | ICD-10-CM | POA: Diagnosis present

## 2021-02-15 DIAGNOSIS — G9340 Encephalopathy, unspecified: Secondary | ICD-10-CM

## 2021-02-15 DIAGNOSIS — R4182 Altered mental status, unspecified: Secondary | ICD-10-CM | POA: Diagnosis present

## 2021-02-15 DIAGNOSIS — Z8616 Personal history of COVID-19: Secondary | ICD-10-CM | POA: Diagnosis not present

## 2021-02-15 DIAGNOSIS — N179 Acute kidney failure, unspecified: Secondary | ICD-10-CM | POA: Diagnosis present

## 2021-02-15 DIAGNOSIS — R6520 Severe sepsis without septic shock: Secondary | ICD-10-CM | POA: Diagnosis present

## 2021-02-15 DIAGNOSIS — N401 Enlarged prostate with lower urinary tract symptoms: Secondary | ICD-10-CM | POA: Diagnosis present

## 2021-02-15 DIAGNOSIS — Z7189 Other specified counseling: Secondary | ICD-10-CM | POA: Diagnosis not present

## 2021-02-15 DIAGNOSIS — A419 Sepsis, unspecified organism: Secondary | ICD-10-CM | POA: Diagnosis not present

## 2021-02-15 DIAGNOSIS — Z950 Presence of cardiac pacemaker: Secondary | ICD-10-CM | POA: Diagnosis not present

## 2021-02-15 DIAGNOSIS — R338 Other retention of urine: Secondary | ICD-10-CM | POA: Diagnosis present

## 2021-02-15 DIAGNOSIS — Z8679 Personal history of other diseases of the circulatory system: Secondary | ICD-10-CM | POA: Diagnosis not present

## 2021-02-15 DIAGNOSIS — Z952 Presence of prosthetic heart valve: Secondary | ICD-10-CM | POA: Diagnosis not present

## 2021-02-15 DIAGNOSIS — N1831 Chronic kidney disease, stage 3a: Secondary | ICD-10-CM | POA: Diagnosis present

## 2021-02-15 DIAGNOSIS — E876 Hypokalemia: Secondary | ICD-10-CM | POA: Diagnosis present

## 2021-02-15 DIAGNOSIS — I495 Sick sinus syndrome: Secondary | ICD-10-CM | POA: Diagnosis present

## 2021-02-15 DIAGNOSIS — J449 Chronic obstructive pulmonary disease, unspecified: Secondary | ICD-10-CM | POA: Diagnosis present

## 2021-02-15 DIAGNOSIS — I5032 Chronic diastolic (congestive) heart failure: Secondary | ICD-10-CM | POA: Diagnosis present

## 2021-02-15 DIAGNOSIS — S72142D Displaced intertrochanteric fracture of left femur, subsequent encounter for closed fracture with routine healing: Secondary | ICD-10-CM | POA: Diagnosis not present

## 2021-02-15 DIAGNOSIS — W19XXXD Unspecified fall, subsequent encounter: Secondary | ICD-10-CM | POA: Diagnosis present

## 2021-02-15 DIAGNOSIS — G9341 Metabolic encephalopathy: Secondary | ICD-10-CM | POA: Diagnosis present

## 2021-02-15 DIAGNOSIS — Z7952 Long term (current) use of systemic steroids: Secondary | ICD-10-CM | POA: Diagnosis not present

## 2021-02-15 DIAGNOSIS — Y732 Prosthetic and other implants, materials and accessory gastroenterology and urology devices associated with adverse incidents: Secondary | ICD-10-CM | POA: Diagnosis present

## 2021-02-15 DIAGNOSIS — Z66 Do not resuscitate: Secondary | ICD-10-CM | POA: Diagnosis present

## 2021-02-15 DIAGNOSIS — Z515 Encounter for palliative care: Secondary | ICD-10-CM | POA: Diagnosis not present

## 2021-02-15 LAB — RESP PANEL BY RT-PCR (FLU A&B, COVID) ARPGX2
Influenza A by PCR: NEGATIVE
Influenza B by PCR: NEGATIVE
SARS Coronavirus 2 by RT PCR: POSITIVE — AB

## 2021-02-15 MED ORDER — FESOTERODINE FUMARATE ER 4 MG PO TB24
4.0000 mg | ORAL_TABLET | Freq: Every day | ORAL | Status: DC
  Administered 2021-02-16 – 2021-02-18 (×3): 4 mg via ORAL
  Filled 2021-02-15 (×4): qty 1

## 2021-02-15 MED ORDER — TAMSULOSIN HCL 0.4 MG PO CAPS
0.4000 mg | ORAL_CAPSULE | Freq: Every day | ORAL | Status: DC
  Administered 2021-02-16 – 2021-02-18 (×3): 0.4 mg via ORAL
  Filled 2021-02-15 (×3): qty 1

## 2021-02-15 MED ORDER — SODIUM CHLORIDE 0.9 % IV SOLN
1.0000 g | INTRAVENOUS | Status: DC
  Administered 2021-02-15 – 2021-02-16 (×2): 1 g via INTRAVENOUS
  Filled 2021-02-15 (×4): qty 10

## 2021-02-15 MED ORDER — AMIODARONE HCL 200 MG PO TABS
200.0000 mg | ORAL_TABLET | Freq: Two times a day (BID) | ORAL | Status: DC
  Administered 2021-02-16 – 2021-02-18 (×5): 200 mg via ORAL
  Filled 2021-02-15 (×5): qty 1

## 2021-02-15 MED ORDER — SODIUM CHLORIDE 0.9 % IV SOLN: INTRAVENOUS | Status: DC

## 2021-02-15 MED ORDER — MIDODRINE HCL 5 MG PO TABS
10.0000 mg | ORAL_TABLET | Freq: Three times a day (TID) | ORAL | Status: DC
  Administered 2021-02-16 – 2021-02-18 (×7): 10 mg via ORAL
  Filled 2021-02-15 (×7): qty 2

## 2021-02-15 MED ORDER — LORAZEPAM 2 MG/ML IJ SOLN: 1.0000 mg | INTRAMUSCULAR | Status: DC | PRN

## 2021-02-15 MED ORDER — FERROUS SULFATE 325 (65 FE) MG PO TABS
325.0000 mg | ORAL_TABLET | Freq: Every day | ORAL | Status: DC
  Administered 2021-02-16 – 2021-02-18 (×3): 325 mg via ORAL
  Filled 2021-02-15 (×3): qty 1

## 2021-02-15 MED ORDER — FINASTERIDE 5 MG PO TABS
5.0000 mg | ORAL_TABLET | Freq: Every day | ORAL | Status: DC
  Administered 2021-02-16 – 2021-02-18 (×3): 5 mg via ORAL
  Filled 2021-02-15 (×3): qty 1

## 2021-02-15 MED ORDER — MORPHINE SULFATE (PF) 2 MG/ML IV SOLN: 1.0000 mg | INTRAVENOUS | Status: DC | PRN

## 2021-02-15 NOTE — ED Notes (Signed)
Respirations even and unlabored. NAD noted at this time.

## 2021-02-15 NOTE — ED Provider Notes (Signed)
----------------------------------------- °  2:37 AM on 02/15/2021 -----------------------------------------   Patient has been observed in the emergency department x6 hours.  Blood pressure remains in the 80s, heart rate 60s, saturations 100%.  At this time will discuss with hospital services for admission for comfort care.   Paulette Blanch, MD 02/15/21 [phone]

## 2021-02-15 NOTE — H&P (Signed)
History and Physical    EMIT KUENZEL YBO:[id number] DOB: Dec 23, 1923 DOA: 02/14/2021  PCP: Birdie Sons, MD  Patient coming from: Skilled nursing facility.  History obtained from patient's daughter and son.  Patient is encephalopathic.  Chief Complaint: Change in mental status.  HPI: Thomas Matthews is a 87 y.o. male with history of chronic atrial fibrillation, congestive heart failure, COPD, anemia, chronic kidney disease stage III was recently admitted for orthostatic hypotension was found to have acute change in mental status last evening at skilled nursing facility.  As per the family they had visited the patient few hours prior to this happening.  Denies any nausea vomiting diarrhea chest pain or shortness of breath.  ED Course: In the ER patient was hypotensive tachycardic and hypothermic.  UA is consistent with UTI.  Initially sepsis work-up was started with empiric antibiotics and fluids.  After family came and had discussion plan was to make patient complete comfort measures.  Patient is a DNR patient was started on Ativan and morphine.  COVID test is pending.  Review of Systems: As per HPI, rest all negative.   Past Medical History:  Diagnosis Date   CHF (congestive heart failure) ([place])    Dysrhythmia     Past Surgical History:  Procedure Laterality Date   INTRAMEDULLARY (IM) NAIL INTERTROCHANTERIC Left 01/12/2021   Procedure: INTRAMEDULLARY (IM) NAIL INTERTROCHANTRIC;  Surgeon: Corky Mull, MD;  Location: [facility] ORS;  Service: Orthopedics;  Laterality: Left;     reports that he has quit smoking. He has never used smokeless tobacco. He reports that he does not drink alcohol and does not use drugs.  Allergies  Allergen Reactions   Amlodipine Besylate Swelling   Felodipine Swelling   Oxybutynin Chloride Other (See Comments)    Other reaction(s): Dizziness Tolerates taking at night   Simvastatin     Other reaction(s): Dizziness   Terazosin     Other  reaction(s): Low blood pressure    Family History  Family history unknown: Yes    Prior to Admission medications   Medication Sig Start Date End Date Taking? Authorizing Provider  amiodarone (PACERONE) 200 MG tablet Take 1 tablet (200 mg total) by mouth 2 (two) times daily. 12/17/20  Yes [place] Donnamarie Poag, DO  calcium citrate-vitamin D (CITRACAL+D) 315-200 MG-UNIT tablet Take 2 tablets by mouth daily.   Yes [provider]  cholecalciferol (VITAMIN D3) 25 MCG (1000 UNIT) tablet Take 2,000 Units by mouth daily.   Yes [provider]  feeding supplement (ENSURE ENLIVE / ENSURE PLUS) LIQD Take 237 mLs by mouth 2 (two) times daily between meals. 01/28/21  Yes Loletha Grayer, MD  ferrous sulfate 325 (65 FE) MG tablet Take 1 tablet (325 mg total) by mouth daily with breakfast. 01/28/21 02/27/21 Yes Wieting, Richard, MD  finasteride (PROSCAR) 5 MG tablet Take 1 tablet (5 mg total) by mouth daily. 01/28/21  Yes Wieting, Richard, MD  furosemide (LASIX) 20 MG tablet Take 1 tablet (20 mg total) by mouth daily as needed (shortness of breath or weight gain of 3 lbs in a day or 5 lbs in a week). 01/28/21  Yes Wieting, Richard, MD  LAGEVRIO 200 MG CAPS capsule Take 4 capsules by mouth 2 (two) times daily. 02/05/21  Yes [provider]  midodrine (PROAMATINE) 10 MG tablet Take 1 tablet (10 mg total) by mouth 3 (three) times daily with meals. 01/28/21  Yes Wieting, Richard, MD  pantoprazole (PROTONIX) 40 MG tablet Take 40 mg  by mouth 2 (two) times daily.   Yes [provider]  polyethylene glycol (MIRALAX / GLYCOLAX) 17 g packet Take 17 g by mouth daily. 01/29/21  Yes Wieting, Richard, MD  predniSONE (DELTASONE) 50 MG tablet Take 50 mg by mouth daily with breakfast.   Yes [provider]  senna-docusate (SENOKOT-S) 8.6-50 MG tablet Take 1 tablet by mouth daily.   Yes [provider]  tamsulosin (FLOMAX) 0.4 MG CAPS capsule Take 0.4 mg by mouth daily after  breakfast. 11/11/11  Yes [provider]  tolterodine (DETROL LA) 4 MG 24 hr capsule Take 4 mg by mouth daily.   Yes [provider]  acetaminophen (TYLENOL) 325 MG tablet Take 2 tablets (650 mg total) by mouth every 6 (six) hours as needed for mild pain or moderate pain. 01/28/21   Loletha Grayer, MD    Physical Exam: Constitutional: Moderately built and nourished. Vitals:   02/14/21 2300 02/14/21 2330 02/15/21 0035 02/15/21 0236  BP: (!) 99/45 128/61 (!) 115/55 (!) 81/43  Pulse: 76 64 67 75  Resp: 17 (!) 24 18 17   Temp:      TempSrc:      SpO2: 100% (!) 88% 100% 99%   Eyes: Anicteric no pallor. ENMT: No discharge from the ears eyes nose and mouth. Neck: No mass felt.  No neck rigidity. Respiratory: No rhonchi or crepitations. Cardiovascular: S1-S2 heard. Abdomen: Soft nontender bowel sound present. Musculoskeletal: No edema. Skin: No rash. Neurologic: Patient is encephalopathic does not follow commands. Psychiatric: Encephalopathic.   Labs on Admission: I have personally reviewed following labs and imaging studies  CBC: Recent Labs  Lab 02/14/21 2102  WBC 8.9  NEUTROABS 7.5  HGB 11.4*  HCT 34.6*  MCV 99.4  PLT 209   Basic Metabolic Panel: Recent Labs  Lab 02/14/21 2102  NA 130*  K 3.4*  CL 96*  CO2 25  GLUCOSE 210*  BUN 22  CREATININE 1.33*  CALCIUM 8.2*   GFR: CrCl cannot be calculated (Unknown ideal weight.). Liver Function Tests: Recent Labs  Lab 02/14/21 2102  AST 22  ALT 15  ALKPHOS 77  BILITOT 1.3*  PROT 6.2*  ALBUMIN 2.8*   No results for input(s): LIPASE, AMYLASE in the last 168 hours. No results for input(s): AMMONIA in the last 168 hours. Coagulation Profile: No results for input(s): INR, PROTIME in the last 168 hours. Cardiac Enzymes: No results for input(s): CKTOTAL, CKMB, CKMBINDEX, TROPONINI in the last 168 hours. BNP (last 3 results) No results for input(s): PROBNP in the last 8760 hours. HbA1C: No results  for input(s): HGBA1C in the last 72 hours. CBG: Recent Labs  Lab 02/14/21 2050  GLUCAP 190*   Lipid Profile: No results for input(s): CHOL, HDL, LDLCALC, TRIG, CHOLHDL, LDLDIRECT in the last 72 hours. Thyroid Function Tests: No results for input(s): TSH, T4TOTAL, FREET4, T3FREE, THYROIDAB in the last 72 hours. Anemia Panel: No results for input(s): VITAMINB12, FOLATE, FERRITIN, TIBC, IRON, RETICCTPCT in the last 72 hours. Urine analysis:    Component Value Date/Time   COLORURINE YELLOW (A) 02/14/2021 2127   APPEARANCEUR TURBID (A) 02/14/2021 2127   APPEARANCEUR Clear 10/13/2011 1105   LABSPEC 1.006 02/14/2021 2127   LABSPEC 1.008 10/13/2011 1105   PHURINE 9.0 (H) 02/14/2021 2127   GLUCOSEU NEGATIVE 02/14/2021 2127   GLUCOSEU Negative 10/13/2011 1105   HGBUR MODERATE (A) 02/14/2021 2127   BILIRUBINUR NEGATIVE 02/14/2021 2127   BILIRUBINUR Negative 10/13/2011 [place] 02/14/2021 2127  PROTEINUR NEGATIVE 02/14/2021 2127   NITRITE NEGATIVE 02/14/2021 2127   LEUKOCYTESUR LARGE (A) 02/14/2021 2127   LEUKOCYTESUR Negative 10/13/2011 1105   Sepsis Labs: @LABRCNTIP (procalcitonin:4,lacticidven:4) )No results found for this or any previous visit (from the past 240 hour(s)).   Radiological Exams on Admission: DG Chest Portable 1 View  Result Date: 02/14/2021 CLINICAL DATA:  Unresponsive patient. EXAM: PORTABLE CHEST 1 VIEW COMPARISON:  Portable chest 01/22/2021. FINDINGS: The patient is rotated to the right. Left chest wall pacemaker and TEVAR stent are again shown. The heart is moderately enlarged but unchanged. No vascular congestion is seen. Small pleural effusions are again noted with overlying atelectasis. No focal pneumonia is seen. Osteopenia and thoracic spondylosis are redemonstrated as well as chronic rotator cuff arthropathy with acromial humeral abutment. Overall aeration is unchanged with no new abnormality. There is a low inspiration with limited view of  the bases. IMPRESSION: There are persistent small layering pleural effusions with overlying atelectasis. No definitive pneumonia is seen. There is limited visualization of lung bases due to low inspiration. Cardiomegaly again noted without evidence of CHF. Electronically Signed   By: Telford Nab M.D.   On: 02/14/2021 21:43    EKG: Independently reviewed.  A. fib rate controlled.  Assessment/Plan Principal Problem:   Sepsis ([place]) Active Problems:   Cardiac pacemaker in situ   S/P TAVR (transcatheter aortic valve replacement)   Chronic diastolic CHF (congestive heart failure) (HCC)   COPD (chronic obstructive pulmonary disease) (HCC)   H/O sick sinus syndrome   Acute encephalopathy    Sepsis likely from UTI. Acute encephalopathy likely from sepsis. History of A. fib presently rate controlled. History of congestive heart failure appears compensated. History of sick sinus syndrome status post pacemaker. Chronic kidney disease stage III.  Plan -after discussing with patient's daughter and son was at the bedside patient's family is conveyed to the patient that patient is to be made complete comfort measures only with no further labs and to start Ativan and morphine for comfort.  Patient is a DNR.   Since patient has sepsis at presentation with encephalopathy and likely terminal will need inpatient status.   DVT prophylaxis: On comfort measures. Code Status: DNR. Family Communication: Patient's daughter and son at the bedside. Disposition Plan: Likely terminal. Consults called: Palliative care. Admission status: Inpatient.   Rise Patience MD [facility] Pager [phone].  If 7PM-7AM, please contact night-coverage [email or web address] Password [email or web address]  02/15/2021, 3:47 AM

## 2021-02-15 NOTE — ED Notes (Signed)
Pt resting with eyes closed, respirations even and unlabored.

## 2021-02-15 NOTE — ED Notes (Signed)
Daughter Neoma Laming [phone] and son Shanon Brow [phone] request a call with any status change.

## 2021-02-15 NOTE — ED Notes (Signed)
Family at bedside. Patient with eyes opened and responding to family.

## 2021-02-15 NOTE — Progress Notes (Addendum)
Patient seen and examined-note admission plan however see changes as below  87 year old white male resident of [facility] facility with multiple recent back-to-back admissions starting 01/05/2021 Known sick sinus syndrome with PPM, systolic diastolic HF EF 78% 67/67/2094 pulmonary HTN, aortic stenosis with TAVR P A. fib on Eliquis CHADS2 score >4, HTN HLD BPH Prior question of GI bleed 12/2019 managed conservatively given age and comorbidities.  Remdesivir only fluids Recent left hip intertrochanteric fracture operated on 70/96 Recent metabolic encephalopathy probably vasovagal related Last admission 12/7 through 01/28/2021 with orthostatic hypotension needing several fluid boluses and was placed on midodrine at the time--metoprolol was discontinued at that time and Foley catheter was placed because of obstructive uropathy  Came from [facility] on arrival [place] to have sepsis secondary to indwelling Foley started Rocephin-he was more awake family and EDP discussed DNR He was started on comfort measures as blood pressures were in the 80s and he was hypothermic with sepsis prior to admission He also was found to be COVID-positive but had recently completed 10 days isolation  When I see him in the room the first thing his son asks is "I want to talk to you --what is going on with my dad"  We had a very spirited conversation subsequently as patient is now much more awake and coherent Family definitely feel they would want treatment of his urinary infection so we will discontinue comfort measures start Rocephin and fluids I think palliative care should come by and see the patient and he should transition back to [employer] once this acute infection is treated with palliative care following and may be hospice given his subacute decline since his hip surgery  He is able to identify me and confirms he would not want to be resuscitated aggressively   Covid + 10 days  ago or more UTI from prior to admission likely Foley catheter related Attempt voiding trial Continue Rocephin every 24, CBC positive a.m. Ambulate if possible Sick sinus syndrome A. fib CHA2DS2-VASc >2 No rate control at this time Continue amiodarone 200 twice daily for rhythm control HTN with now hypotension Continue midodrine 10 mg 3 times daily  Palliative care consulted for goals of care and discussion   No charge  Verneita Griffes, MD [facility] 2:18 PM

## 2021-02-15 NOTE — ED Notes (Signed)
Pt resting in bed with eyes closed, respirations even and unlabored at this time.

## 2021-02-16 DIAGNOSIS — I5032 Chronic diastolic (congestive) heart failure: Secondary | ICD-10-CM

## 2021-02-16 DIAGNOSIS — A419 Sepsis, unspecified organism: Secondary | ICD-10-CM | POA: Diagnosis not present

## 2021-02-16 DIAGNOSIS — Z8679 Personal history of other diseases of the circulatory system: Secondary | ICD-10-CM

## 2021-02-16 DIAGNOSIS — G9340 Encephalopathy, unspecified: Secondary | ICD-10-CM | POA: Diagnosis not present

## 2021-02-16 LAB — COMPREHENSIVE METABOLIC PANEL
ALT: 13 U/L (ref 0–44)
AST: 19 U/L (ref 15–41)
Albumin: 2.2 g/dL — ABNORMAL LOW (ref 3.5–5.0)
Alkaline Phosphatase: 58 U/L (ref 38–126)
Anion gap: 5 (ref 5–15)
BUN: 24 mg/dL — ABNORMAL HIGH (ref 8–23)
CO2: 23 mmol/L (ref 22–32)
Calcium: 7.4 mg/dL — ABNORMAL LOW (ref 8.9–10.3)
Chloride: 103 mmol/L (ref 98–111)
Creatinine, Ser: 1.11 mg/dL (ref 0.61–1.24)
GFR, Estimated: >60 mL/min (ref >60)
Glucose, Bld: 73 mg/dL (ref 70–99)
Potassium: 3.1 mmol/L — ABNORMAL LOW (ref 3.5–5.1)
Sodium: 131 mmol/L — ABNORMAL LOW (ref 135–145)
Total Bilirubin: 0.7 mg/dL (ref 0.3–1.2)
Total Protein: 5.2 g/dL — ABNORMAL LOW (ref 6.5–8.1)

## 2021-02-16 LAB — CBC WITH DIFFERENTIAL/PLATELET
Abs Immature Granulocytes: 0.14 10*3/uL — ABNORMAL HIGH (ref 0.00–0.07)
Basophils Absolute: 0 10*3/uL (ref 0.0–0.1)
Basophils Relative: 0 %
Eosinophils Absolute: 0.1 10*3/uL (ref 0.0–0.5)
Eosinophils Relative: 1 %
HCT: 27.7 % — ABNORMAL LOW (ref 39.0–52.0)
Hemoglobin: 9.4 g/dL — ABNORMAL LOW (ref 13.0–17.0)
Immature Granulocytes: 2 %
Lymphocytes Relative: 9 %
Lymphs Abs: 0.6 10*3/uL — ABNORMAL LOW (ref 0.7–4.0)
MCH: 33 pg (ref 26.0–34.0)
MCHC: 33.9 g/dL (ref 30.0–36.0)
MCV: 97.2 fL (ref 80.0–100.0)
Monocytes Absolute: 0.3 10*3/uL (ref 0.1–1.0)
Monocytes Relative: 5 %
Neutro Abs: 5.4 10*3/uL (ref 1.7–7.7)
Neutrophils Relative %: 83 %
Platelets: 134 10*3/uL — ABNORMAL LOW (ref 150–400)
RBC: 2.85 MIL/uL — ABNORMAL LOW (ref 4.22–5.81)
RDW: 16.9 % — ABNORMAL HIGH (ref 11.5–15.5)
WBC: 6.6 10*3/uL (ref 4.0–10.5)
nRBC: 0 % (ref 0.0–0.2)

## 2021-02-16 MED ORDER — ENSURE ENLIVE PO LIQD
237.0000 mL | Freq: Two times a day (BID) | ORAL | Status: DC
  Administered 2021-02-16 – 2021-02-17 (×4): 237 mL via ORAL

## 2021-02-16 MED ORDER — ENOXAPARIN SODIUM 40 MG/0.4ML IJ SOSY
40.0000 mg | PREFILLED_SYRINGE | INTRAMUSCULAR | Status: DC
  Administered 2021-02-16 – 2021-02-17 (×2): 40 mg via SUBCUTANEOUS
  Filled 2021-02-16 (×2): qty 0.4

## 2021-02-16 NOTE — Progress Notes (Signed)
PROGRESS NOTE    Thomas Matthews  IWO:[id number] DOB: 06/13/1923 DOA: 02/14/2021 PCP: Birdie Sons, MD   Brief Narrative: Taken from prior notes. Thomas Matthews is a 88 y.o. male with history of chronic atrial fibrillation, congestive heart failure, COPD, anemia, chronic kidney disease stage III was recently admitted for orthostatic hypotension was found to have acute change in mental status last evening at skilled nursing facility. Multiple recent admissions with UTI, hip fracture secondary to fall and hypotension. Recent COVID infection and he completed his 10 days of isolation at facility. Family initially decided to proceed with comfort measures only, later they provoked and would like to treat the treatables.  Urine cultures growing Proteus Mirabillis-pending susceptibility. If remains stable-can be discharged back to rehab tomorrow.  Subjective: Patient was seen and examined today.  No new complaints.  He was asking for some liquid versus soft diet stating that he cannot chew.  Assessment & Plan:   Principal Problem:   Sepsis ([place]) Active Problems:   Cardiac pacemaker in situ   S/P TAVR (transcatheter aortic valve replacement)   Chronic diastolic CHF (congestive heart failure) (HCC)   COPD (chronic obstructive pulmonary disease) (HCC)   H/O sick sinus syndrome   Acute encephalopathy  Acute metabolic encephalopathy secondary to UTI.  Appears to be at baseline now. Urine cultures growing Proteus Mirabillis-pending susceptibility.  Patient has a chronic Foley. -Continue with ceftriaxone -We will switch to p.o. antibiotics to complete a 7-day course once susceptibilities back.  Sick sinus syndrome/A. Fib.  Patient has a chronic pacemaker. Per daughter Eliquis was discontinued recently after having a fall. -Continue with home dose of amiodarone  Hypotension.  Currently blood pressure within goal. -Continue with midodrine.  Mild hypokalemia.  Seems chronic and at  baseline.  AKI with CKD stage IIIa.  Creatinine improved to baseline now. -Monitor renal function -Avoid nephrotoxins  Recurrent hospitalizations.  Patient with multiple recent hospitalizations secondary to UTI, hypotension, fall and hip fracture.  Recent COVID infection.  He was discharged to rehab after most recent couple of hospitalizations. -Palliative care consult  Objective: Vitals:   02/15/21 1620 02/15/21 2020 02/16/21 0447 02/16/21 1002  BP: 130/60 (!) 143/85 138/71 136/72  Pulse: 70 85 79 81  Resp: 20 16 16 15   Temp: 98.3 F (36.8 C) 98.6 F (37 C) 98.4 F (36.9 C) 97.8 F (36.6 C)  TempSrc: Oral Oral Oral Oral  SpO2:  94% 94% 95%  Weight:      Height:        Intake/Output Summary (Last 24 hours) at 02/16/2021 1622 Last data filed at 02/16/2021 1006 Gross per 24 hour  Intake 970.71 ml  Output 800 ml  Net 170.71 ml   Filed Weights   02/15/21 1325  Weight: 67.9 kg    Examination:  General exam: Appears calm and comfortable  Respiratory system: Clear to auscultation. Respiratory effort normal. Cardiovascular system: S1 & S2 heard, RRR.  Gastrointestinal system: Soft, nontender, nondistended, bowel sounds positive. Central nervous system: Alert and oriented. No focal neurological deficits. Extremities: No edema, no cyanosis, pulses intact and symmetrical. Psychiatry: Judgement and insight appear normal.    DVT prophylaxis: Lovenox Code Status: DNR Family Communication: Discussed with daughter on phone. Disposition Plan:  Status is: Inpatient  Remains inpatient appropriate because: Severity of illness   Level of care: Med-Surg  All the records are reviewed and case discussed with Care Management/Social Worker. Management plans discussed with the patient, nursing and they are in agreement.  Consultants:  None  Procedures:  Antimicrobials:  Ceftriaxone  Data Reviewed: I have personally reviewed following labs and imaging studies  CBC: Recent  Labs  Lab 02/14/21 2102 02/16/21 0458  WBC 8.9 6.6  NEUTROABS 7.5 5.4  HGB 11.4* 9.4*  HCT 34.6* 27.7*  MCV 99.4 97.2  PLT 162 322*   Basic Metabolic Panel: Recent Labs  Lab 02/14/21 2102 02/16/21 0458  NA 130* 131*  K 3.4* 3.1*  CL 96* 103  CO2 25 23  GLUCOSE 210* 73  BUN 22 24*  CREATININE 1.33* 1.11  CALCIUM 8.2* 7.4*   GFR: Estimated Creatinine Clearance: 37.2 mL/min (by C-G formula based on SCr of 1.11 mg/dL). Liver Function Tests: Recent Labs  Lab 02/14/21 2102 02/16/21 0458  AST 22 19  ALT 15 13  ALKPHOS 77 58  BILITOT 1.3* 0.7  PROT 6.2* 5.2*  ALBUMIN 2.8* 2.2*   No results for input(s): LIPASE, AMYLASE in the last 168 hours. No results for input(s): AMMONIA in the last 168 hours. Coagulation Profile: No results for input(s): INR, PROTIME in the last 168 hours. Cardiac Enzymes: No results for input(s): CKTOTAL, CKMB, CKMBINDEX, TROPONINI in the last 168 hours. BNP (last 3 results) No results for input(s): PROBNP in the last 8760 hours. HbA1C: No results for input(s): HGBA1C in the last 72 hours. CBG: Recent Labs  Lab 02/14/21 2050  GLUCAP 190*   Lipid Profile: No results for input(s): CHOL, HDL, LDLCALC, TRIG, CHOLHDL, LDLDIRECT in the last 72 hours. Thyroid Function Tests: No results for input(s): TSH, T4TOTAL, FREET4, T3FREE, THYROIDAB in the last 72 hours. Anemia Panel: No results for input(s): VITAMINB12, FOLATE, FERRITIN, TIBC, IRON, RETICCTPCT in the last 72 hours. Sepsis Labs: No results for input(s): PROCALCITON, LATICACIDVEN in the last 168 hours.  Recent Results (from the past 240 hour(s))  Urine Culture     Status: Abnormal (Preliminary result)   Collection Time: 02/14/21  9:27 PM   Specimen: Urine, Random  Result Value Ref Range Status   Specimen Description   Final    URINE, RANDOM Performed at [facility], [address], [place], [place] [address]    Special Requests   Final    NONE Performed at [facility], [address] [place], [place] [address]    Culture (A)  Final    >=100,000 COLONIES/mL PROTEUS MIRABILIS CULTURE REINCUBATED FOR BETTER GROWTH SUSCEPTIBILITIES TO FOLLOW Performed at [facility], [place] [address], [place], [place] [address]    Report Status PENDING  Incomplete  Resp Panel by RT-PCR (Flu A&B, Covid) Nasopharyngeal Swab     Status: Abnormal   Collection Time: 02/15/21  6:06 AM   Specimen: Nasopharyngeal Swab; Nasopharyngeal(NP) swabs in vial transport medium  Result Value Ref Range Status   SARS Coronavirus 2 by RT PCR POSITIVE (A) NEGATIVE Final    Comment: (NOTE) SARS-CoV-2 target nucleic acids are DETECTED.  The SARS-CoV-2 RNA is generally detectable in upper respiratory specimens during the acute phase of infection. Positive results are indicative of the presence of the identified virus, but do not rule out bacterial infection or co-infection with other pathogens not detected by the test. Clinical correlation with patient history and other diagnostic information is necessary to determine patient infection status. The expected result is Negative.  Fact Sheet for Patients: EntrepreneurPulse.com.au  Fact Sheet for Healthcare Providers: IncredibleEmployment.be  This test is not yet approved or cleared by the [place] FDA and  has been authorized for detection and/or  diagnosis of SARS-CoV-2 by FDA under an Emergency Use Authorization (EUA).  This EUA will remain in effect (meaning this test can be used) for the duration of  the COVID-19 declaration under Section 564(b)(1) of the A ct, 21 U.S.C. section 360bbb-3(b)(1), unless the authorization is terminated or revoked sooner.     Influenza A by PCR NEGATIVE NEGATIVE Final   Influenza B by PCR NEGATIVE NEGATIVE Final    Comment: (NOTE) The Xpert Xpress SARS-CoV-2/FLU/RSV plus assay is intended as an aid in the diagnosis of influenza from  Nasopharyngeal swab specimens and should not be used as a sole basis for treatment. Nasal washings and aspirates are unacceptable for Xpert Xpress SARS-CoV-2/FLU/RSV testing.  Fact Sheet for Patients: EntrepreneurPulse.com.au  Fact Sheet for Healthcare Providers: IncredibleEmployment.be  This test is not yet approved or cleared by the [place] FDA and has been authorized for detection and/or diagnosis of SARS-CoV-2 by FDA under an Emergency Use Authorization (EUA). This EUA will remain in effect (meaning this test can be used) for the duration of the COVID-19 declaration under Section 564(b)(1) of the Act, 21 U.S.C. section 360bbb-3(b)(1), unless the authorization is terminated or revoked.  Performed at [facility], [address], [place], [place] [address]      Radiology Studies: DG Chest Portable 1 View  Result Date: 02/14/2021 CLINICAL DATA:  Unresponsive patient. EXAM: PORTABLE CHEST 1 VIEW COMPARISON:  Portable chest 01/22/2021. FINDINGS: The patient is rotated to the right. Left chest wall pacemaker and TEVAR stent are again shown. The heart is moderately enlarged but unchanged. No vascular congestion is seen. Small pleural effusions are again noted with overlying atelectasis. No focal pneumonia is seen. Osteopenia and thoracic spondylosis are redemonstrated as well as chronic rotator cuff arthropathy with acromial humeral abutment. Overall aeration is unchanged with no new abnormality. There is a low inspiration with limited view of the bases. IMPRESSION: There are persistent small layering pleural effusions with overlying atelectasis. No definitive pneumonia is seen. There is limited visualization of lung bases due to low inspiration. Cardiomegaly again noted without evidence of CHF. Electronically Signed   By: Telford Nab M.D.   On: 02/14/2021 21:43    Scheduled Meds:  amiodarone  200 mg Oral BID   feeding supplement  237  mL Oral BID BM   ferrous sulfate  325 mg Oral Q breakfast   fesoterodine  4 mg Oral Daily   finasteride  5 mg Oral Daily   midodrine  10 mg Oral TID WC   tamsulosin  0.4 mg Oral QPC breakfast   Continuous Infusions:  sodium chloride 50 mL/hr at 02/16/21 1006   cefTRIAXone (ROCEPHIN)  IV Stopped (02/15/21 2118)     LOS: 1 day   Time spent: 40 minutes. More than 50% of the time was spent in counseling/coordination of care  Lorella Nimrod, MD [facility]  If 7PM-7AM, please contact night-coverage [email or web address]  02/16/2021, 4:22 PM   This record has been created using Systems analyst. Errors have been sought and corrected,but may not always be located. Such creation errors do not reflect on the standard of care.

## 2021-02-16 NOTE — Progress Notes (Signed)
PHARMACIST - PHYSICIAN COMMUNICATION  CONCERNING:  Pharmacy consulted for Enoxaparin (Lovenox) for DVT Prophylaxis   Filed Weights   02/15/21 1325  Weight: 67.9 kg (149 lb 11.1 oz)    Body mass index is 23.45 kg/m.  Estimated Creatinine Clearance: 37.2 mL/min (by C-G formula based on SCr of 1.11 mg/dL).   PLAN: Start enoxaparin 40 mg every 24 hours    Sherilyn Banker, PharmD Clinical Pharmacist  02/16/2021 4:30 PM

## 2021-02-17 DIAGNOSIS — A419 Sepsis, unspecified organism: Secondary | ICD-10-CM | POA: Diagnosis not present

## 2021-02-17 DIAGNOSIS — G9340 Encephalopathy, unspecified: Secondary | ICD-10-CM | POA: Diagnosis not present

## 2021-02-17 DIAGNOSIS — Z7189 Other specified counseling: Secondary | ICD-10-CM | POA: Diagnosis not present

## 2021-02-17 DIAGNOSIS — Z515 Encounter for palliative care: Secondary | ICD-10-CM

## 2021-02-17 DIAGNOSIS — Z66 Do not resuscitate: Secondary | ICD-10-CM

## 2021-02-17 MED ORDER — POTASSIUM CHLORIDE 20 MEQ PO PACK
40.0000 meq | PACK | Freq: Two times a day (BID) | ORAL | Status: AC
  Administered 2021-02-17: 40 meq via ORAL
  Filled 2021-02-17: qty 2

## 2021-02-17 MED ORDER — CEPHALEXIN 500 MG PO CAPS
500.0000 mg | ORAL_CAPSULE | Freq: Two times a day (BID) | ORAL | Status: DC
  Administered 2021-02-17 – 2021-02-18 (×2): 500 mg via ORAL
  Filled 2021-02-17 (×2): qty 1

## 2021-02-17 MED ORDER — CALCIUM CARBONATE 1250 (500 CA) MG PO TABS
500.0000 mg | ORAL_TABLET | Freq: Two times a day (BID) | ORAL | Status: AC
  Administered 2021-02-17 (×2): 500 mg via ORAL
  Filled 2021-02-17 (×3): qty 1

## 2021-02-17 NOTE — TOC Initial Note (Signed)
Transition of Care [facility]) - Initial/Assessment Note    Patient Details  Name: Thomas Matthews MRN: [id number] Date of Birth: [birth year]  Transition of [facility]) CM/SW Contact:    Beverly Sessions, RN Phone Number: 02/17/2021, 10:10 AM  Clinical Narrative:                 Reached out to Magda Paganini with [employer] to determine is patient could return.  Per Magda Paganini patient is LTC and can return tomorrow.  Per Magda Paganini patient tested positive at the facility, she is to let me know what date the patient tested positive    Palliative following up today     Patient Goals and CMS Choice        Expected Discharge Plan and Services                                                Prior Living Arrangements/Services                       Activities of Daily Living Home Assistive Devices/Equipment: Bedside commode/3-in-1, Blood pressure cuff, Dentures (specify type), Hospital bed, Walker (specify type), Wheelchair ADL Screening (condition at time of admission) Patient's cognitive ability adequate to safely complete daily activities?: No Is the patient deaf or have difficulty hearing?: Yes Does the patient have difficulty seeing, even when wearing glasses/contacts?: No Does the patient have difficulty concentrating, remembering, or making decisions?: Yes Patient able to express need for assistance with ADLs?: Yes Does the patient have difficulty dressing or bathing?: Yes Independently performs ADLs?: No Communication: Independent Dressing (OT): Needs assistance Is this a change from baseline?: Pre-admission baseline Grooming: Needs assistance Is this a change from baseline?: Pre-admission baseline Feeding: Needs assistance Is this a change from baseline?: Pre-admission baseline Bathing: Needs assistance Is this a change from baseline?: Pre-admission baseline Toileting: Needs assistance Is this a change from baseline?: Pre-admission baseline In/Out Bed: Needs  assistance Is this a change from baseline?: Pre-admission baseline Walks in Home: Needs assistance Is this a change from baseline?: Pre-admission baseline Does the patient have difficulty walking or climbing stairs?: Yes Weakness of Legs: Both Weakness of Arms/Hands: None  Permission Sought/Granted                  Emotional Assessment              Admission diagnosis:  End of life care [Z51.5] Unresponsive [R41.89] Sepsis ([place]) [A41.9] Patient Active Problem List   Diagnosis Date Noted   Sepsis ([place]) 02/15/2021   Acute encephalopathy 02/15/2021   Lower abdominal pain    Constipation    Lactic acidosis    Iron deficiency anemia    Acute kidney injury superimposed on CKD ([place])    Hyponatremia    Acute urinary retention    Atrial tachycardia ([place])    Hypotension 01/22/2021   Hypokalemia 17/40/8144   Acute metabolic encephalopathy 81/85/6314   H/O sick sinus syndrome 01/13/2021   BPH (benign prostatic hyperplasia) 01/13/2021   Femur fracture, left ([place]) 01/12/2021   Atrial fibrillation, chronic (HCC) 01/12/2021   Chronic diastolic CHF (congestive heart failure) ([place]) 01/12/2021   COPD (chronic obstructive pulmonary disease) ([place]) 01/12/2021   Protein-calorie malnutrition, severe 12/14/2020   Pressure injury of skin 12/13/2020   NSVT (nonsustained ventricular tachycardia) 12/11/2020   S/P TAVR (transcatheter aortic  valve replacement) 12/11/2020   Acid reflux 07/27/2017   Acne erythematosa 07/27/2017   Flutter-fibrillation 07/27/2017   Cardiac pacemaker in situ 07/27/2017   CAD (coronary artery disease) 07/27/2017   DD (diverticular disease) 07/27/2017   History of GI bleed 04/16/2008   Benign neoplasm of colon 02/27/2004   PCP:  Birdie Sons, MD Pharmacy:   [facility] [place], [place] [address] [address] Phone: [phone] Fax: [place], [place] [address] Phone: [phone] Fax: [phone]     Social Determinants of Health (SDOH) Interventions    Readmission Risk Interventions No flowsheet data found.

## 2021-02-17 NOTE — Consult Note (Signed)
Consultation Note Date: 02/17/2021   Patient Name: Thomas Matthews  DOB: 1923-04-22  MRN: [id number]  Age / Sex: 88 y.o., male  PCP: Birdie Sons, MD Referring Physician: Lorella Nimrod, MD  Reason for Consultation: Establishing goals of care  HPI/Patient Profile: 88 y.o. male  with past medical history of CHF, a fib, COPD, CKD, SSS s/p PPM, aortic stenosis s/p TAVR, anemia, recent left hip intertrochanteric fracture operated on 11/27, and urinary retention admitted on 02/14/2021 with AMS and UTI.  Patient with multiple admissions recently. When initially admitted patient was started on comfort measures though later family requested restart of antibiotics and fluids. PMT consulted to further discuss [place].   Clinical Assessment and Goals of Care: I have reviewed medical records including EPIC notes, labs and imaging, received report from RN, assessed the patient and then met with patients son and daughter  to discuss diagnosis prognosis, GOC, EOL wishes, disposition and options.  I introduced Palliative Medicine as specialized medical care for people living with serious illness. It focuses on providing relief from the symptoms and stress of a serious illness. The goal is to improve quality of life for both the patient and the family.  We discussed a brief life review of the patient. Patient's spouse passed last year.  As far as functional and nutritional status, they tell me patient was living independently and able to care for himself until 9 weeks ago - has been declining since that time.    We discussed patient's current illness and what it means in the larger context of patient's on-going co-morbidities.  Natural disease trajectory and expectations at EOL were discussed. We discuss repeated infections and high risk for more.  I attempted to elicit values and goals of care important to the patient.  Family agree patient would not want life  prolonged artificially and would not want aggressive medical interventions. There was some disagreement about if patient should return to the hospital at all when more complications arise - after extensive discussion family agreed they would want return to hospital for a treatable infection.   The difference between aggressive medical intervention and comfort care was considered in light of the patient's goals of care.   Advance directives, concepts specific to code status, artificial feeding and hydration, and rehospitalization were considered and discussed.  I completed a MOST form today. The family outlined their wishes for the following treatment decisions:  Cardiopulmonary Resuscitation: Do Not Attempt Resuscitation (DNR/No CPR)  Medical Interventions: Limited Additional Interventions: Use medical treatment, IV fluids and cardiac monitoring as indicated, DO NOT USE intubation or mechanical ventilation. May consider use of less invasive airway support such as BiPAP or CPAP. Also provide comfort measures. Transfer to the hospital if indicated. Avoid intensive care.   Antibiotics: Antibiotics if indicated  IV Fluids: IV fluids if indicated  Feeding Tube: No feeding tube    Discussed with family the importance of continued conversation with family and the medical providers regarding overall plan of care and treatment options, ensuring decisions are within the context of the patients values and GOCs.    Hospice and Palliative Care services outpatient were explained and offered.  Questions and concerns were addressed. The family was encouraged to call with questions or concerns.   Primary Decision Maker NEXT OF KIN - daughter Thomas Matthews who is also HCPOA    SUMMARY OF RECOMMENDATIONS   - MOST completed as above reflecting DNR but continue limited medical interventions - recommend palliative services follow at facility (  as of today family interested in return to [facility]) -  **please include in discharge summary** - diet change to dys 1 per family request as patients gums are sore d/t dentures  Code Status/Advance Care Planning: DNR  Additional Recommendations (Limitations, Scope, Preferences): No Artificial Feeding and No Tracheostomy  Discharge Planning: [place] for rehab with Palliative care service follow-up      Primary Diagnoses: Present on Admission:  Sepsis ([place])  Acute encephalopathy  Cardiac pacemaker in situ  Chronic diastolic CHF (congestive heart failure) (HCC)  COPD (chronic obstructive pulmonary disease) ([place])   I have reviewed the medical record, interviewed the patient and family, and examined the patient. The following aspects are pertinent.  Past Medical History:  Diagnosis Date   CHF (congestive heart failure) (HCC)    Dysrhythmia    Social History   Socioeconomic History   Marital status: Married    Spouse name: Not on file   Number of children: Not on file   Years of education: Not on file   Highest education level: Not on file  Occupational History   Not on file  Tobacco Use   Smoking status: Former   Smokeless tobacco: Never  Substance and Sexual Activity   Alcohol use: No   Drug use: No   Sexual activity: Not on file  Other Topics Concern   Not on file  Social History Narrative   Not on file   Social Determinants of Health   Financial Resource Strain: Not on file  Food Insecurity: Not on file  Transportation Needs: Not on file  Physical Activity: Not on file  Stress: Not on file  Social Connections: Not on file   Family History  Family history unknown: Yes   Scheduled Meds:  amiodarone  200 mg Oral BID   calcium carbonate  500 mg of elemental calcium Oral BID WC   cephALEXin  500 mg Oral Q12H   enoxaparin (LOVENOX) injection  40 mg Subcutaneous Q24H   feeding supplement  237 mL Oral BID BM   ferrous sulfate  325 mg Oral Q breakfast   fesoterodine  4 mg Oral Daily    finasteride  5 mg Oral Daily   midodrine  10 mg Oral TID WC   potassium chloride  40 mEq Oral BID   tamsulosin  0.4 mg Oral QPC breakfast   Continuous Infusions:  sodium chloride 50 mL/hr at 02/17/21 0945   PRN Meds:. Allergies  Allergen Reactions   Amlodipine Besylate Swelling   Felodipine Swelling   Oxybutynin Chloride Other (See Comments)    Other reaction(s): Dizziness Tolerates taking at night   Simvastatin     Other reaction(s): Dizziness   Terazosin     Other reaction(s): Low blood pressure   Review of Systems  Unable to perform ROS: Mental status change   Physical Exam Constitutional:      Comments: Confused but pleasant - denies complaints  Pulmonary:     Effort: Pulmonary effort is normal.  Skin:    General: Skin is warm and dry.  Neurological:     Mental Status: He is alert.    Vital Signs: BP (!) 150/96 (BP Location: Left Arm)    Pulse 79    Temp 97.8 F (36.6 C) (Oral)    Resp 18    Ht _0  (1.702 m)    Wt 67.9 kg    SpO2 95%    BMI 23.45 kg/m  Pain Scale: 0-10   Pain Score:  0-No pain   SpO2: SpO2: 95 % O2 Device:SpO2: 95 % O2 Flow Rate: .   IO: Intake/output summary:  Intake/Output Summary (Last 24 hours) at 02/17/2021 1316 Last data filed at 02/17/2021 0500 Gross per 24 hour  Intake 905.69 ml  Output 850 ml  Net 55.69 ml    LBM:   Baseline Weight: Weight: 67.9 kg Most recent weight: Weight: 67.9 kg     Palliative Assessment/Data: PPS 40%    Time Total: 90 minutes Greater than 50%  of this time was spent counseling and coordinating care related to the above assessment and plan.  Juel Burrow, DNP, AGNP-C Palliative Medicine Team [phone] Pager: [phone]

## 2021-02-17 NOTE — Progress Notes (Signed)
PROGRESS NOTE    Thomas Matthews  UMP:[id number] DOB: Jul 09, 1923 DOA: 02/14/2021 PCP: Birdie Sons, MD   Brief Narrative: Taken from prior notes. Thomas Matthews is a 88 y.o. male with history of chronic atrial fibrillation, congestive heart failure, COPD, anemia, chronic kidney disease stage III was recently admitted for orthostatic hypotension was found to have acute change in mental status last evening at skilled nursing facility. Multiple recent admissions with UTI, hip fracture secondary to fall and hypotension. Recent COVID infection and he completed his 10 days of isolation at facility. Family initially decided to proceed with comfort measures only, later they provoked and would like to treat the treatables.  Urine cultures growing Proteus Mirabillis-antibiotic switched to Keflex. If remains stable-can be discharged back to rehab tomorrow. Concern of cognitive impairment as he need cues and assistance for swallowing.  Swallow team is recommending dysphagia 1 diet. Patient will also need outpatient follow-up with palliative care at rehab.  Subjective: Patient was seen and examined today.  Requiring soft mushy diet as have no teeth.  Some concern of cognitive impairment as he need close and assistance for swallowing.  Assessment & Plan:   Principal Problem:   Sepsis ([place]) Active Problems:   Cardiac pacemaker in situ   S/P TAVR (transcatheter aortic valve replacement)   Chronic diastolic CHF (congestive heart failure) (HCC)   COPD (chronic obstructive pulmonary disease) (HCC)   H/O sick sinus syndrome   Acute encephalopathy  Acute metabolic encephalopathy secondary to UTI.  Appears to be at baseline now. Urine cultures growing Proteus Mirabillis. Patient has a chronic Foley. -Switch ceftriaxone with Keflex to complete a 7-day course.  Sick sinus syndrome/A. Fib.  Patient has a chronic pacemaker. Per daughter Eliquis was discontinued recently after having a  fall. -Continue with home dose of amiodarone  Hypotension.  Currently blood pressure within goal. -Continue with midodrine.  Mild hypokalemia.  Seems chronic and at baseline.  AKI with CKD stage IIIa.  Creatinine improved to baseline now. -Monitor renal function -Avoid nephrotoxins  Recurrent hospitalizations.  Patient with multiple recent hospitalizations secondary to UTI, hypotension, fall and hip fracture.  Recent COVID infection.  He was discharged to rehab after most recent couple of hospitalizations. -Palliative care consult-CODE STATUS changed to DNR. -Patient will need outpatient palliative care follow-up at rehab.  Objective: Vitals:   02/16/21 1924 02/17/21 0410 02/17/21 0732 02/17/21 1324  BP: 113/72 (!) 136/91 (!) 150/96 120/62  Pulse: 84 (!) 58 79 76  Resp: 20 16 18    Temp: (!) 97.4 F (36.3 C) 97.6 F (36.4 C) 97.8 F (36.6 C)   TempSrc: Oral Oral Oral   SpO2: 98% 97% 95% 97%  Weight:      Height:        Intake/Output Summary (Last 24 hours) at 02/17/2021 1530 Last data filed at 02/17/2021 0500 Gross per 24 hour  Intake 905.69 ml  Output 850 ml  Net 55.69 ml    Filed Weights   02/15/21 1325  Weight: 67.9 kg    Examination:  General.  Frail elderly man, in no acute distress. Pulmonary.  Lungs clear bilaterally, normal respiratory effort. CV.  Regular rate and rhythm, no JVD, rub or murmur. Abdomen.  Soft, nontender, nondistended, BS positive. CNS.  Alert and oriented .  No focal neurologic deficit. Extremities.  No edema, no cyanosis, pulses intact and symmetrical. Psychiatry.  Judgment and insight appears impaired.   DVT prophylaxis: Lovenox Code Status: DNR Family Communication: Discussed with daughter and  son at bedside Disposition Plan:  Status is: Inpatient  Remains inpatient appropriate because: Severity of illness   Level of care: Med-Surg  All the records are reviewed and case discussed with Care Management/Social Worker. Management  plans discussed with the patient, nursing and they are in agreement.  Consultants:  None  Procedures:  Antimicrobials:  Ceftriaxone  Data Reviewed: I have personally reviewed following labs and imaging studies  CBC: Recent Labs  Lab 02/14/21 2102 02/16/21 0458  WBC 8.9 6.6  NEUTROABS 7.5 5.4  HGB 11.4* 9.4*  HCT 34.6* 27.7*  MCV 99.4 97.2  PLT 162 134*    Basic Metabolic Panel: Recent Labs  Lab 02/14/21 2102 02/16/21 0458  NA 130* 131*  K 3.4* 3.1*  CL 96* 103  CO2 25 23  GLUCOSE 210* 73  BUN 22 24*  CREATININE 1.33* 1.11  CALCIUM 8.2* 7.4*    GFR: Estimated Creatinine Clearance: 37.2 mL/min (by C-G formula based on SCr of 1.11 mg/dL). Liver Function Tests: Recent Labs  Lab 02/14/21 2102 02/16/21 0458  AST 22 19  ALT 15 13  ALKPHOS 77 58  BILITOT 1.3* 0.7  PROT 6.2* 5.2*  ALBUMIN 2.8* 2.2*    No results for input(s): LIPASE, AMYLASE in the last 168 hours. No results for input(s): AMMONIA in the last 168 hours. Coagulation Profile: No results for input(s): INR, PROTIME in the last 168 hours. Cardiac Enzymes: No results for input(s): CKTOTAL, CKMB, CKMBINDEX, TROPONINI in the last 168 hours. BNP (last 3 results) No results for input(s): PROBNP in the last 8760 hours. HbA1C: No results for input(s): HGBA1C in the last 72 hours. CBG: Recent Labs  Lab 02/14/21 2050  GLUCAP 190*    Lipid Profile: No results for input(s): CHOL, HDL, LDLCALC, TRIG, CHOLHDL, LDLDIRECT in the last 72 hours. Thyroid Function Tests: No results for input(s): TSH, T4TOTAL, FREET4, T3FREE, THYROIDAB in the last 72 hours. Anemia Panel: No results for input(s): VITAMINB12, FOLATE, FERRITIN, TIBC, IRON, RETICCTPCT in the last 72 hours. Sepsis Labs: No results for input(s): PROCALCITON, LATICACIDVEN in the last 168 hours.  Recent Results (from the past 240 hour(s))  Urine Culture     Status: Abnormal (Preliminary result)   Collection Time: 02/14/21  9:27 PM   Specimen:  Urine, Random  Result Value Ref Range Status   Specimen Description   Final    URINE, RANDOM Performed at [facility] [place], [address] [place], [place] [address]    Special Requests   Final    NONE Performed at [facility], [address] [place], [place] [address]    Culture (A)  Final    >=100,000 COLONIES/mL PROTEUS MIRABILIS CULTURE REINCUBATED FOR BETTER GROWTH Performed at [facility], [place] [address], [place], [place] [address]    Report Status PENDING  Incomplete   Organism ID, Bacteria PROTEUS MIRABILIS (A)  Final      Susceptibility   Proteus mirabilis - MIC*    AMPICILLIN <=2 SENSITIVE Sensitive     CEFAZOLIN 16 SENSITIVE Sensitive     CEFEPIME 4 INTERMEDIATE Intermediate     CEFTRIAXONE <=0.25 SENSITIVE Sensitive     CIPROFLOXACIN <=0.25 SENSITIVE Sensitive     GENTAMICIN <=1 SENSITIVE Sensitive     IMIPENEM 2 SENSITIVE Sensitive     NITROFURANTOIN 128 RESISTANT Resistant     TRIMETH/SULFA <=20 SENSITIVE Sensitive     AMPICILLIN/SULBACTAM <=2 SENSITIVE Sensitive     PIP/TAZO <=4 SENSITIVE Sensitive     * >=100,000  COLONIES/mL PROTEUS MIRABILIS  Resp Panel by RT-PCR (Flu A&B, Covid) Nasopharyngeal Swab     Status: Abnormal   Collection Time: 02/15/21  6:06 AM   Specimen: Nasopharyngeal Swab; Nasopharyngeal(NP) swabs in vial transport medium  Result Value Ref Range Status   SARS Coronavirus 2 by RT PCR POSITIVE (A) NEGATIVE Final    Comment: (NOTE) SARS-CoV-2 target nucleic acids are DETECTED.  The SARS-CoV-2 RNA is generally detectable in upper respiratory specimens during the acute phase of infection. Positive results are indicative of the presence of the identified virus, but do not rule out bacterial infection or co-infection with other pathogens not detected by the test. Clinical correlation with patient history and other diagnostic information is necessary to determine patient infection status. The expected result is  Negative.  Fact Sheet for Patients: EntrepreneurPulse.com.au  Fact Sheet for Healthcare Providers: IncredibleEmployment.be  This test is not yet approved or cleared by the [place] FDA and  has been authorized for detection and/or diagnosis of SARS-CoV-2 by FDA under an Emergency Use Authorization (EUA).  This EUA will remain in effect (meaning this test can be used) for the duration of  the COVID-19 declaration under Section 564(b)(1) of the A ct, 21 U.S.C. section 360bbb-3(b)(1), unless the authorization is terminated or revoked sooner.     Influenza A by PCR NEGATIVE NEGATIVE Final   Influenza B by PCR NEGATIVE NEGATIVE Final    Comment: (NOTE) The Xpert Xpress SARS-CoV-2/FLU/RSV plus assay is intended as an aid in the diagnosis of influenza from Nasopharyngeal swab specimens and should not be used as a sole basis for treatment. Nasal washings and aspirates are unacceptable for Xpert Xpress SARS-CoV-2/FLU/RSV testing.  Fact Sheet for Patients: EntrepreneurPulse.com.au  Fact Sheet for Healthcare Providers: IncredibleEmployment.be  This test is not yet approved or cleared by the [place] FDA and has been authorized for detection and/or diagnosis of SARS-CoV-2 by FDA under an Emergency Use Authorization (EUA). This EUA will remain in effect (meaning this test can be used) for the duration of the COVID-19 declaration under Section 564(b)(1) of the Act, 21 U.S.C. section 360bbb-3(b)(1), unless the authorization is terminated or revoked.  Performed at [facility], [address] [place], [place] [address]       Radiology Studies: No results found.  Scheduled Meds:  amiodarone  200 mg Oral BID   calcium carbonate  500 mg of elemental calcium Oral BID WC   cephALEXin  500 mg Oral Q12H   enoxaparin (LOVENOX) injection  40 mg Subcutaneous Q24H   feeding supplement  237 mL Oral  BID BM   ferrous sulfate  325 mg Oral Q breakfast   fesoterodine  4 mg Oral Daily   finasteride  5 mg Oral Daily   midodrine  10 mg Oral TID WC   tamsulosin  0.4 mg Oral QPC breakfast   Continuous Infusions:  sodium chloride 50 mL/hr at 02/17/21 0945     LOS: 2 days   Time spent: 40 minutes. More than 50% of the time was spent in counseling/coordination of care  Lorella Nimrod, MD [facility]  If 7PM-7AM, please contact night-coverage [email or web address]  02/17/2021, 3:30 PM   This record has been created using Systems analyst. Errors have been sought and corrected,but may not always be located. Such creation errors do not reflect on the standard of care.

## 2021-02-17 NOTE — TOC Progression Note (Signed)
Transition of Care [facility]) - Progression Note    Patient Details  Name: Thomas Matthews MRN: [id number] Date of Birth: [birth year]  [facility] CM/SW Contact  Beverly Sessions, RN Phone Number: 02/17/2021, 2:31 PM  Clinical Narrative:    Daughter was under the impression that patient was at [facility] under [place].  Magda Paganini confirms that patient was under LTC, however they can provide rehab if indicated.   PT order pending    Expected Discharge Plan: [place] Barriers to Discharge: Continued Medical Work up  Expected Discharge Plan and Services Expected Discharge Plan: [place]       Living arrangements for the past 2 months: [place]                                       Social Determinants of Health (SDOH) Interventions    Readmission Risk Interventions Readmission Risk Prevention Plan 02/17/2021  Transportation Screening Complete  Medication Review [employer]) Complete  HRI or Home Care Consult Complete  Palliative Care Screening Complete  Skilled Nursing Facility Complete  Some recent data might be hidden

## 2021-02-18 DIAGNOSIS — A419 Sepsis, unspecified organism: Secondary | ICD-10-CM | POA: Diagnosis not present

## 2021-02-18 MED ORDER — ACETAMINOPHEN 325 MG PO TABS
650.0000 mg | ORAL_TABLET | Freq: Four times a day (QID) | ORAL | Status: DC | PRN
  Administered 2021-02-18: 650 mg via ORAL
  Filled 2021-02-18: qty 2

## 2021-02-18 MED ORDER — DIPHENHYDRAMINE HCL 25 MG PO CAPS
25.0000 mg | ORAL_CAPSULE | Freq: Once | ORAL | Status: AC
  Administered 2021-02-18: 25 mg via ORAL
  Filled 2021-02-18: qty 1

## 2021-02-18 MED ORDER — CEPHALEXIN 500 MG PO CAPS: 500.0000 mg | ORAL_CAPSULE | Freq: Two times a day (BID) | ORAL | 0 refills | Status: AC

## 2021-02-18 NOTE — TOC Transition Note (Signed)
Transition of Care [facility]) - CM/SW Discharge Note   Patient Details  Name: Thomas Matthews MRN: [id number] Date of Birth: [birth year]  Transition of Care [facility]) CM/SW Contact:  Beverly Sessions, RN Phone Number: 02/18/2021, 11:55 AM   Clinical Narrative:      Patient will DC [facility] Anticipated DC date:02/18/21  Family notified:MD notified daughter Transport by:EMS  Per MD patient ready for DC to . RN, patient, patient's family, and facility notified of DC. Discharge Summary sent to facility. RN given number for report. DC packet on chart. Ambulance transport requested for patient.  TOC signing off.  Thomas Matthews [facility] [phone]    Barriers to Discharge: Continued Medical Work up   Patient Goals and CMS Choice        Discharge Placement                       Discharge Plan and Services                                     Social Determinants of Health (SDOH) Interventions     Readmission Risk Interventions Readmission Risk Prevention Plan 02/17/2021  Transportation Screening Complete  Medication Review (RN Care Manager) Complete  HRI or Home Care Consult Complete  Palliative Care Screening Complete  Skilled Nursing Facility Complete  Some recent data might be hidden

## 2021-02-18 NOTE — Discharge Summary (Signed)
Physician Discharge Summary  Thomas Matthews:[id number] DOB: 05-14-1923 DOA: 02/14/2021  PCP: Birdie Sons, MD  Admit date: 02/14/2021 Discharge date: 02/18/2021  Admitted From: SNF Disposition: SNF  Recommendations for Outpatient Follow-up:  Follow up with PCP in 1-2 weeks Please obtain BMP/CBC in one week Please follow up on the following pending results: None  Home Health: Equipment/Devices: Discharge Condition: Stable CODE STATUS: DNR Diet recommendation:  Dysphagia 1  Brief/Interim Summary: Thomas Matthews is a 88 y.o. male with history of chronic atrial fibrillation, congestive heart failure, COPD, anemia, chronic kidney disease stage III was recently admitted for orthostatic hypotension was found to have acute change in mental status last evening at skilled nursing facility. Multiple recent admissions with UTI, hip fracture secondary to fall and hypotension. Recent COVID infection and he completed his 10 days of isolation at facility. Family initially decided to proceed with comfort measures only, later they provoked and would like to treat the treatables.  Urine cultures growing Proteus Mirabillis-antibiotic switched to Keflex, will complete a several day course after getting 6 more doses.  Palliative care was also consulted to discuss the goals of care.  MOST form completed.  Patient is not DNR, family would like to have limited medical intervention which include antibiotics and IV fluid if needed.  Patient needs to have outpatient follow-up with palliative care for ongoing discussion and symptom management.  Patient has an history of CKD stage IIIa, had mild AKI which improved to baseline now.  Will continue with rest of his home medications and follow-up with his providers.  Discharge Diagnoses:  Principal Problem:   Sepsis ([place]) Active Problems:   Cardiac pacemaker in situ   S/P TAVR (transcatheter aortic valve replacement)   Chronic diastolic CHF (congestive  heart failure) (HCC)   COPD (chronic obstructive pulmonary disease) ([place])   H/O sick sinus syndrome   Acute encephalopathy   Discharge Instructions  Discharge Instructions     Amb Referral to Palliative Care   Complete by: As directed    Diet - low sodium heart healthy   Complete by: As directed    Increase activity slowly   Complete by: As directed    Increase activity slowly   Complete by: As directed    No dressing needed   Complete by: As directed    No dressing needed   Complete by: As directed       Allergies as of 02/18/2021       Reactions   Amlodipine Besylate Swelling   Felodipine Swelling   Oxybutynin Chloride Other (See Comments)   Other reaction(s): Dizziness Tolerates taking at night   Simvastatin    Other reaction(s): Dizziness   Terazosin    Other reaction(s): Low blood pressure        Medication List     STOP taking these medications    predniSONE 50 MG tablet Commonly known as: DELTASONE       TAKE these medications    acetaminophen 325 MG tablet Commonly known as: Tylenol Take 2 tablets (650 mg total) by mouth every 6 (six) hours as needed for mild pain or moderate pain.   amiodarone 200 MG tablet Commonly known as: PACERONE Take 1 tablet (200 mg total) by mouth 2 (two) times daily.   calcium citrate-vitamin D 315-200 MG-UNIT tablet Commonly known as: CITRACAL+D Take 2 tablets by mouth daily.   cephALEXin 500 MG capsule Commonly known as: KEFLEX Take 1 capsule (500 mg total) by mouth every 12 (twelve)  hours for 6 doses.   cholecalciferol 25 MCG (1000 UNIT) tablet Commonly known as: VITAMIN D3 Take 2,000 Units by mouth daily.   feeding supplement Liqd Take 237 mLs by mouth 2 (two) times daily between meals.   ferrous sulfate 325 (65 FE) MG tablet Take 1 tablet (325 mg total) by mouth daily with breakfast.   finasteride 5 MG tablet Commonly known as: PROSCAR Take 1 tablet (5 mg total) by mouth daily.   furosemide 20 MG  tablet Commonly known as: LASIX Take 1 tablet (20 mg total) by mouth daily as needed (shortness of breath or weight gain of 3 lbs in a day or 5 lbs in a week).   Lagevrio 200 MG Caps capsule Generic drug: molnupiravir EUA Take 4 capsules by mouth 2 (two) times daily.   midodrine 10 MG tablet Commonly known as: PROAMATINE Take 1 tablet (10 mg total) by mouth 3 (three) times daily with meals.   pantoprazole 40 MG tablet Commonly known as: PROTONIX Take 40 mg by mouth 2 (two) times daily.   polyethylene glycol 17 g packet Commonly known as: MIRALAX / GLYCOLAX Take 17 g by mouth daily.   senna-docusate 8.6-50 MG tablet Commonly known as: Senokot-S Take 1 tablet by mouth daily.   tamsulosin 0.4 MG Caps capsule Commonly known as: FLOMAX Take 0.4 mg by mouth daily after breakfast.   tolterodine 4 MG 24 hr capsule Commonly known as: DETROL LA Take 4 mg by mouth daily.               Discharge Care Instructions  (From admission, onward)           Start     Ordered   02/18/21 0000  No dressing needed        02/18/21 1116   02/18/21 0000  No dressing needed        02/18/21 1116            Follow-up Information     Fisher, Kirstie Peri, MD. Schedule an appointment as soon as possible for a visit.   Specialty: Family Medicine Contact information: [address] [place] [address] [phone]                Allergies  Allergen Reactions   Amlodipine Besylate Swelling   Felodipine Swelling   Oxybutynin Chloride Other (See Comments)    Other reaction(s): Dizziness Tolerates taking at night   Simvastatin     Other reaction(s): Dizziness   Terazosin     Other reaction(s): Low blood pressure    Consultations: Palliative care  Procedures/Studies: CT ABDOMEN PELVIS WO CONTRAST  Result Date: 01/27/2021 CLINICAL DATA:  Abdominal pain in a 88 year old male. EXAM: CT ABDOMEN AND PELVIS WITHOUT CONTRAST TECHNIQUE: Multidetector CT  imaging of the abdomen and pelvis was performed following the standard protocol without IV contrast. COMPARISON:  Comparison is made with March of 2009. FINDINGS: Lower chest: Small effusions. Basilar atelectasis. 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 Pacer device with leads in the RIGHT heart, heart is incompletely imaged. Small pericardial effusion. Hepatobiliary: Post cholecystectomy.  Hepatic contours are smooth. Pancreas: No signs of peripancreatic inflammation or gross ductal dilation. Spleen: Normal in size and contour. Adrenals/Urinary Tract: Adrenal glands are normal. No perinephric stranding. No suspicious renal lesion. Urinary bladder is collapsed with marked perivesical stranding. Foley catheter in situ. Moderate size RIGHT renal cyst measures 4 cm. Stomach/Bowel: Small hiatal hernia. Cecum and ileocecal valve is contained within a RIGHT inguinal  hernia. Small amount of fluid in the hernia sac. No substantial wall thickening. No signs of obstruction related to this process. Appendix not visualized. Reportedly post appendectomy based on outside clinical summaries. Colonic diverticulosis throughout the colon. No signs of inflammation about the proximal colon the distal colon in the rectosigmoid is mildly thickened. Stool fills the entire colon. No signs of colonic twist but there is a large volume of formed stool in the rectum. Rectal wall thickening and thickening of the wall of the distal sigmoid colon is present. Perirectal stranding also noted. Vascular/Lymphatic: Aortic atherosclerosis. No sign of aneurysm. Smooth contour of the IVC. There is no gastrohepatic or hepatoduodenal ligament lymphadenopathy. No retroperitoneal or mesenteric lymphadenopathy. No pelvic sidewall lymphadenopathy. Reproductive: Unremarkable by CT. Other: Mildly wall edema in the setting of bilateral effusions. Trace fluid in the abdomen also small amount of fluid in the RIGHT inguinal canal. Musculoskeletal: No acute  bone finding. No destructive bone process. Spinal degenerative changes. Post ORIF of a LEFT intratrochanteric fracture. Construct for fixation is incompletely evaluated. No acute process in this area. IMPRESSION: Rectal wall thickening despite rectal distension with moderately large volume of formed stool in the rectum, question fecal impaction with developing stercoral colitis. Perivesical stranding as well. Correlate with urinalysis to exclude the possibility of concomitant cystitis. RIGHT inguinal hernia contains the cecum and ileocecal valve without obstruction Body wall edema and bilateral effusions as discussed. Aortic atherosclerosis. Aortic Atherosclerosis (ICD10-I70.0). Electronically Signed   By: Zetta Bills M.D.   On: 01/27/2021 16:57   DG Chest Portable 1 View  Result Date: 02/14/2021 CLINICAL DATA:  Unresponsive patient. EXAM: PORTABLE CHEST 1 VIEW COMPARISON:  Portable chest 01/22/2021. FINDINGS: The patient is rotated to the right. Left chest wall pacemaker and TEVAR stent are again shown. The heart is moderately enlarged but unchanged. No vascular congestion is seen. Small pleural effusions are again noted with overlying atelectasis. No focal pneumonia is seen. Osteopenia and thoracic spondylosis are redemonstrated as well as chronic rotator cuff arthropathy with acromial humeral abutment. Overall aeration is unchanged with no new abnormality. There is a low inspiration with limited view of the bases. IMPRESSION: There are persistent small layering pleural effusions with overlying atelectasis. No definitive pneumonia is seen. There is limited visualization of lung bases due to low inspiration. Cardiomegaly again noted without evidence of CHF. Electronically Signed   By: Telford Nab M.D.   On: 02/14/2021 21:43   DG Chest Portable 1 View  Result Date: 01/22/2021 CLINICAL DATA:  Shortness of breath and hypotension. EXAM: PORTABLE CHEST 1 VIEW COMPARISON:  Chest x-ray dated January 17, 2021. FINDINGS: The patient is rotated to the right. Unchanged left chest wall pacemaker. Unchanged cardiomegaly status post TAVR. Unchanged small bilateral pleural effusions. No consolidation or pneumothorax. No acute osseous abnormality. IMPRESSION: 1. Unchanged small bilateral pleural effusions. Electronically Signed   By: Titus Dubin M.D.   On: 01/22/2021 13:55    Subjective: Patient was seen and examined today. No new complaints.  Discharge Exam: Vitals:   02/18/21 0454 02/18/21 0749  BP: (!) 159/76 (!) 142/76  Pulse: 84 97  Resp: 18 18  Temp: 98.2 F (36.8 C) 97.6 F (36.4 C)  SpO2: 98% 97%   Vitals:   02/17/21 2000 02/18/21 0334 02/18/21 0454 02/18/21 0749  BP: 132/86 (!) 142/69 (!) 159/76 (!) 142/76  Pulse: 76 91 84 97  Resp: 18 20 18 18   Temp: 98 F (36.7 C) 98.1 F (36.7 C) 98.2 F (36.8 C) 97.6  F (36.4 C)  TempSrc: Oral Oral Oral Oral  SpO2: 100% 95% 98% 97%  Weight:      Height:        General: Pt is alert, awake, not in acute distress Cardiovascular: RRR, S1/S2 +, no rubs, no gallops Respiratory: CTA bilaterally, no wheezing, no rhonchi Abdominal: Soft, NT, ND, bowel sounds + Extremities: no edema, no cyanosis   The results of significant diagnostics from this hospitalization (including imaging, microbiology, ancillary and laboratory) are listed below for reference.    Microbiology: Recent Results (from the past 240 hour(s))  Urine Culture     Status: Abnormal (Preliminary result)   Collection Time: 02/14/21  9:27 PM   Specimen: Urine, Random  Result Value Ref Range Status   Specimen Description   Final    URINE, RANDOM Performed at [facility], [address], [place], [place] [address]    Special Requests   Final    NONE Performed at [facility], [address] [place], [place] [address]    Culture (A)  Final    >=100,000 COLONIES/mL PROTEUS MIRABILIS CULTURE REINCUBATED FOR BETTER GROWTH Performed at [facility], [place] [address] [place], [place] [address]    Report Status PENDING  Incomplete   Organism ID, Bacteria PROTEUS MIRABILIS (A)  Final      Susceptibility   Proteus mirabilis - MIC*    AMPICILLIN <=2 SENSITIVE Sensitive     CEFAZOLIN 16 SENSITIVE Sensitive     CEFEPIME 4 INTERMEDIATE Intermediate     CEFTRIAXONE <=0.25 SENSITIVE Sensitive     CIPROFLOXACIN <=0.25 SENSITIVE Sensitive     GENTAMICIN <=1 SENSITIVE Sensitive     IMIPENEM 2 SENSITIVE Sensitive     NITROFURANTOIN 128 RESISTANT Resistant     TRIMETH/SULFA <=20 SENSITIVE Sensitive     AMPICILLIN/SULBACTAM <=2 SENSITIVE Sensitive     PIP/TAZO <=4 SENSITIVE Sensitive     * >=100,000 COLONIES/mL PROTEUS MIRABILIS  Resp Panel by RT-PCR (Flu A&B, Covid) Nasopharyngeal Swab     Status: Abnormal   Collection Time: 02/15/21  6:06 AM   Specimen: Nasopharyngeal Swab; Nasopharyngeal(NP) swabs in vial transport medium  Result Value Ref Range Status   SARS Coronavirus 2 by RT PCR POSITIVE (A) NEGATIVE Final    Comment: (NOTE) SARS-CoV-2 target nucleic acids are DETECTED.  The SARS-CoV-2 RNA is generally detectable in upper respiratory specimens during the acute phase of infection. Positive results are indicative of the presence of the identified virus, but do not rule out bacterial infection or co-infection with other pathogens not detected by the test. Clinical correlation with patient history and other diagnostic information is necessary to determine patient infection status. The expected result is Negative.  Fact Sheet for Patients: EntrepreneurPulse.com.au  Fact Sheet for Healthcare Providers: IncredibleEmployment.be  This test is not yet approved or cleared by the [place] FDA and  has been authorized for detection and/or diagnosis of SARS-CoV-2 by FDA under an Emergency Use Authorization (EUA).  This EUA will remain in effect (meaning this test can be used) for the duration  of  the COVID-19 declaration under Section 564(b)(1) of the A ct, 21 U.S.C. section 360bbb-3(b)(1), unless the authorization is terminated or revoked sooner.     Influenza A by PCR NEGATIVE NEGATIVE Final   Influenza B by PCR NEGATIVE NEGATIVE Final    Comment: (NOTE) The Xpert Xpress SARS-CoV-2/FLU/RSV plus assay is intended as an aid in the diagnosis of influenza from Nasopharyngeal swab specimens and should not  be used as a sole basis for treatment. Nasal washings and aspirates are unacceptable for Xpert Xpress SARS-CoV-2/FLU/RSV testing.  Fact Sheet for Patients: EntrepreneurPulse.com.au  Fact Sheet for Healthcare Providers: IncredibleEmployment.be  This test is not yet approved or cleared by the [place] FDA and has been authorized for detection and/or diagnosis of SARS-CoV-2 by FDA under an Emergency Use Authorization (EUA). This EUA will remain in effect (meaning this test can be used) for the duration of the COVID-19 declaration under Section 564(b)(1) of the Act, 21 U.S.C. section 360bbb-3(b)(1), unless the authorization is terminated or revoked.  Performed at [facility], [address] [place], [place] [address]      Labs: BNP (last 3 results) Recent Labs    12/11/20 1302  BNP 628.3*   Basic Metabolic Panel: Recent Labs  Lab 02/14/21 2102 02/16/21 0458  NA 130* 131*  K 3.4* 3.1*  CL 96* 103  CO2 25 23  GLUCOSE 210* 73  BUN 22 24*  CREATININE 1.33* 1.11  CALCIUM 8.2* 7.4*   Liver Function Tests: Recent Labs  Lab 02/14/21 2102 02/16/21 0458  AST 22 19  ALT 15 13  ALKPHOS 77 58  BILITOT 1.3* 0.7  PROT 6.2* 5.2*  ALBUMIN 2.8* 2.2*   No results for input(s): LIPASE, AMYLASE in the last 168 hours. No results for input(s): AMMONIA in the last 168 hours. CBC: Recent Labs  Lab 02/14/21 2102 02/16/21 0458  WBC 8.9 6.6  NEUTROABS 7.5 5.4  HGB 11.4* 9.4*  HCT 34.6* 27.7*  MCV 99.4 97.2   PLT 162 134*   Cardiac Enzymes: No results for input(s): CKTOTAL, CKMB, CKMBINDEX, TROPONINI in the last 168 hours. BNP: Invalid input(s): POCBNP CBG: Recent Labs  Lab 02/14/21 2050  GLUCAP 190*   D-Dimer No results for input(s): DDIMER in the last 72 hours. Hgb A1c No results for input(s): HGBA1C in the last 72 hours. Lipid Profile No results for input(s): CHOL, HDL, LDLCALC, TRIG, CHOLHDL, LDLDIRECT in the last 72 hours. Thyroid function studies No results for input(s): TSH, T4TOTAL, T3FREE, THYROIDAB in the last 72 hours.  Invalid input(s): FREET3 Anemia work up No results for input(s): VITAMINB12, FOLATE, FERRITIN, TIBC, IRON, RETICCTPCT in the last 72 hours. Urinalysis    Component Value Date/Time   COLORURINE YELLOW (A) 02/14/2021 2127   APPEARANCEUR TURBID (A) 02/14/2021 2127   APPEARANCEUR Clear 10/13/2011 1105   LABSPEC 1.006 02/14/2021 2127   LABSPEC 1.008 10/13/2011 1105   PHURINE 9.0 (H) 02/14/2021 2127   GLUCOSEU NEGATIVE 02/14/2021 2127   GLUCOSEU Negative 10/13/2011 1105   HGBUR MODERATE (A) 02/14/2021 2127   BILIRUBINUR NEGATIVE 02/14/2021 2127   BILIRUBINUR Negative 10/13/2011 [place] 02/14/2021 2127   PROTEINUR NEGATIVE 02/14/2021 2127   NITRITE NEGATIVE 02/14/2021 2127   LEUKOCYTESUR LARGE (A) 02/14/2021 2127   LEUKOCYTESUR Negative 10/13/2011 1105   Sepsis Labs Invalid input(s): PROCALCITONIN,  WBC,  LACTICIDVEN Microbiology Recent Results (from the past 240 hour(s))  Urine Culture     Status: Abnormal (Preliminary result)   Collection Time: 02/14/21  9:27 PM   Specimen: Urine, Random  Result Value Ref Range Status   Specimen Description   Final    URINE, RANDOM Performed at [facility], [address] [place], [place] [address]    Special Requests   Final    NONE Performed at [facility], [address] [place], [place] [address]    Culture (A)  Final    >=100,000 COLONIES/mL  PROTEUS  MIRABILIS CULTURE REINCUBATED FOR BETTER GROWTH Performed at [facility], [place] [address], [place], [place] [address]    Report Status PENDING  Incomplete   Organism ID, Bacteria PROTEUS MIRABILIS (A)  Final      Susceptibility   Proteus mirabilis - MIC*    AMPICILLIN <=2 SENSITIVE Sensitive     CEFAZOLIN 16 SENSITIVE Sensitive     CEFEPIME 4 INTERMEDIATE Intermediate     CEFTRIAXONE <=0.25 SENSITIVE Sensitive     CIPROFLOXACIN <=0.25 SENSITIVE Sensitive     GENTAMICIN <=1 SENSITIVE Sensitive     IMIPENEM 2 SENSITIVE Sensitive     NITROFURANTOIN 128 RESISTANT Resistant     TRIMETH/SULFA <=20 SENSITIVE Sensitive     AMPICILLIN/SULBACTAM <=2 SENSITIVE Sensitive     PIP/TAZO <=4 SENSITIVE Sensitive     * >=100,000 COLONIES/mL PROTEUS MIRABILIS  Resp Panel by RT-PCR (Flu A&B, Covid) Nasopharyngeal Swab     Status: Abnormal   Collection Time: 02/15/21  6:06 AM   Specimen: Nasopharyngeal Swab; Nasopharyngeal(NP) swabs in vial transport medium  Result Value Ref Range Status   SARS Coronavirus 2 by RT PCR POSITIVE (A) NEGATIVE Final    Comment: (NOTE) SARS-CoV-2 target nucleic acids are DETECTED.  The SARS-CoV-2 RNA is generally detectable in upper respiratory specimens during the acute phase of infection. Positive results are indicative of the presence of the identified virus, but do not rule out bacterial infection or co-infection with other pathogens not detected by the test. Clinical correlation with patient history and other diagnostic information is necessary to determine patient infection status. The expected result is Negative.  Fact Sheet for Patients: EntrepreneurPulse.com.au  Fact Sheet for Healthcare Providers: IncredibleEmployment.be  This test is not yet approved or cleared by the [place] FDA and  has been authorized for detection and/or diagnosis of SARS-CoV-2 by FDA under an Emergency Use Authorization (EUA).  This EUA  will remain in effect (meaning this test can be used) for the duration of  the COVID-19 declaration under Section 564(b)(1) of the A ct, 21 U.S.C. section 360bbb-3(b)(1), unless the authorization is terminated or revoked sooner.     Influenza A by PCR NEGATIVE NEGATIVE Final   Influenza B by PCR NEGATIVE NEGATIVE Final    Comment: (NOTE) The Xpert Xpress SARS-CoV-2/FLU/RSV plus assay is intended as an aid in the diagnosis of influenza from Nasopharyngeal swab specimens and should not be used as a sole basis for treatment. Nasal washings and aspirates are unacceptable for Xpert Xpress SARS-CoV-2/FLU/RSV testing.  Fact Sheet for Patients: EntrepreneurPulse.com.au  Fact Sheet for Healthcare Providers: IncredibleEmployment.be  This test is not yet approved or cleared by the [place] FDA and has been authorized for detection and/or diagnosis of SARS-CoV-2 by FDA under an Emergency Use Authorization (EUA). This EUA will remain in effect (meaning this test can be used) for the duration of the COVID-19 declaration under Section 564(b)(1) of the Act, 21 U.S.C. section 360bbb-3(b)(1), unless the authorization is terminated or revoked.  Performed at [facility], [address] [place], [place] [address]     Time coordinating discharge: Over 30 minutes  SIGNED:  Lorella Nimrod, MD  [facility] 02/18/2021, 11:17 AM  If 7PM-7AM, please contact night-coverage [email or web address]  This record has been created using Systems analyst. Errors have been sought and corrected,but may not always be located. Such creation errors do not reflect on the standard of care.

## 2021-02-18 NOTE — NC FL2 (Signed)
[place] LEVEL OF CARE SCREENING TOOL     IDENTIFICATION  Patient Name: Thomas Matthews Birthdate: 1923-12-15 Sex: male Admission Date (Current Location): 02/14/2021  [facility] and [place] Number:      Facility and Address:  [facility], [address] [place], [place] [address]      Provider Number: [id number]  Attending Physician Name and Address:  Lorella Nimrod, MD  Relative Name and Phone Number:       Current Level of Care: SNF Recommended Level of Care: [place] Prior Approval Number:    Date Approved/Denied:   PASRR Number: [id number]  Discharge Plan: SNF    Current Diagnoses: Patient Active Problem List   Diagnosis Date Noted   Sepsis ([place]) 02/15/2021   Acute encephalopathy 02/15/2021   Lower abdominal pain    Constipation    Lactic acidosis    Iron deficiency anemia    Acute kidney injury superimposed on CKD ([place])    Hyponatremia    Acute urinary retention    Atrial tachycardia ([place])    Hypotension 01/22/2021   Hypokalemia 57/32/2025   Acute metabolic encephalopathy 42/70/6237   H/O sick sinus syndrome 01/13/2021   BPH (benign prostatic hyperplasia) 01/13/2021   Femur fracture, left ([place]) 01/12/2021   Atrial fibrillation, chronic ([place]) 01/12/2021   Chronic diastolic CHF (congestive heart failure) ([place]) 01/12/2021   COPD (chronic obstructive pulmonary disease) ([place]) 01/12/2021   Protein-calorie malnutrition, severe 12/14/2020   Pressure injury of skin 12/13/2020   NSVT (nonsustained ventricular tachycardia) 12/11/2020   S/P TAVR (transcatheter aortic valve replacement) 12/11/2020   Acid reflux 07/27/2017   Acne erythematosa 07/27/2017   Flutter-fibrillation 07/27/2017   Cardiac pacemaker in situ 07/27/2017   CAD (coronary artery disease) 07/27/2017   DD (diverticular disease) 07/27/2017   History of GI bleed 04/16/2008   Benign neoplasm of colon 02/27/2004    Orientation RESPIRATION  BLADDER Height & Weight     Self, Place, Situation  Normal Incontinent Weight: 67.9 kg Height:  5\' 7"  (170.2 cm)  BEHAVIORAL SYMPTOMS/MOOD NEUROLOGICAL BOWEL NUTRITION STATUS      Incontinent Diet (dys 1)  AMBULATORY STATUS COMMUNICATION OF NEEDS Skin   Extensive Assist Verbally PU Stage and Appropriate Care, Bruising, Skin abrasions                       Personal Care Assistance Level of Assistance              Functional Limitations Info             SPECIAL CARE FACTORS FREQUENCY  OT (By licensed OT), PT (By licensed PT)                    Contractures Contractures Info: Not present    Additional Factors Info  Code Status, Allergies Code Status Info: DNR Allergies Info: Amlodipine Besylate, Felodipine, Oxybutynin Chloride, Simvastatin, Terazosin           Current Medications (02/18/2021):  This is the current hospital active medication list Current Facility-Administered Medications  Medication Dose Route Frequency Provider Last Rate Last Admin   0.9 %  sodium chloride infusion   Intravenous Continuous Nita Sells, MD 50 mL/hr at 02/18/21 0615 New Bag at 02/18/21 0615   acetaminophen (TYLENOL) tablet 650 mg  650 mg Oral Q6H PRN Rise Patience, MD   650 mg at 02/18/21 0203   amiodarone (PACERONE) tablet 200 mg  200 mg Oral BID Samtani,  Jai-Gurmukh, MD   200 mg at 02/18/21 0853   cephALEXin (KEFLEX) capsule 500 mg  500 mg Oral Q12H Lorella Nimrod, MD   500 mg at 02/18/21 0855   enoxaparin (LOVENOX) injection 40 mg  40 mg Subcutaneous Q24H Rauer, Forde Dandy, RPH   40 mg at 02/17/21 2002   feeding supplement (ENSURE ENLIVE / ENSURE PLUS) liquid 237 mL  237 mL Oral BID BM Lorella Nimrod, MD   237 mL at 02/17/21 1325   ferrous sulfate tablet 325 mg  325 mg Oral Q breakfast Nita Sells, MD   325 mg at 02/18/21 0853   fesoterodine (TOVIAZ) tablet 4 mg  4 mg Oral Daily Nita Sells, MD   4 mg at 02/18/21 0854   finasteride (PROSCAR)  tablet 5 mg  5 mg Oral Daily Nita Sells, MD   5 mg at 02/18/21 0853   midodrine (PROAMATINE) tablet 10 mg  10 mg Oral TID WC Nita Sells, MD   10 mg at 02/18/21 0853   tamsulosin (FLOMAX) capsule 0.4 mg  0.4 mg Oral QPC breakfast Nita Sells, MD   0.4 mg at 02/18/21 8309     Discharge Medications: Please see discharge summary for a list of discharge medications.  Relevant Imaging Results:  Relevant Lab Results:   Additional Information SSN#  [id number]  Beverly Sessions, RN

## 2021-02-18 NOTE — Care Management Important Message (Signed)
Important Message  Patient Details  Name: Thomas Matthews MRN: [id number] Date of Birth: 1923/07/05   Medicare Important Message Given:  Yes  Reviewed Medicare IM with Earnest Bailey, daughter, at [phone].  Aware of Medicare appeal right.  Copy left in patient's room in belonging bag for reference.    [name] 02/18/2021, 1:32 PM

## 2021-02-18 NOTE — Evaluation (Signed)
Physical Therapy Evaluation Patient Details Name: Thomas Matthews MRN: [id number] DOB: 18-Apr-1923 Today's Date: 02/18/2021  History of Present Illness  Pt is a 88 y.o. male with history of chronic atrial fibrillation, congestive heart failure, COPD, anemia, chronic kidney disease stage III was recently admitted for orthostatic hypotension was found to have acute change in mental status. MD assessment includes: acute metabolic encephalopathy secondary to UTI, hypotension, acute metabolic encephalopathy, AKI, and mild hyopkalemia.   Clinical Impression  Pt was unable to provide history and was alert to self only but was able to follow simple commands with extra cuing and put forth good effort during the session.  Pt required assist with bed mobility tasks and transfers and stood with flexed trunk posture and difficulty fully extending bilateral knees.  Pt was able to take several steps at the EOB but only with great effort and assist for stability.  According to chart review pt now presents with a significant decline in functional status compared to recent prior level of function.  Pt will benefit from PT services in a SNF setting upon discharge to safely address deficits listed in patient problem list for decreased caregiver assistance and eventual return to PLOF.        Recommendations for follow up therapy are one component of a multi-disciplinary discharge planning process, led by the attending physician.  Recommendations may be updated based on patient status, additional functional criteria and insurance authorization.  Follow Up Recommendations Skilled nursing-short term rehab (<3 hours/day)    Assistance Recommended at Discharge Frequent or constant Supervision/Assistance  Patient can return home with the following  A lot of help with bathing/dressing/bathroom;Two people to help with walking and/or transfers;Direct supervision/assist for medications management    Equipment Recommendations  None recommended by PT  Recommendations for Other Services       Functional Status Assessment Patient has had a recent decline in their functional status and demonstrates the ability to make significant improvements in function in a reasonable and predictable amount of time.     Precautions / Restrictions Precautions Precautions: Fall Restrictions Weight Bearing Restrictions: No      Mobility  Bed Mobility Overal bed mobility: Needs Assistance Bed Mobility: Rolling;Supine to Sit;Sit to Supine Rolling: Min assist   Supine to sit: Mod assist Sit to supine: Mod assist   General bed mobility comments: mod A for BLE and trunk control    Transfers Overall transfer level: Needs assistance Equipment used: Rolling walker (2 wheels) Transfers: Sit to/from Stand Sit to Stand: Min assist           General transfer comment: Mod verbal and tactile cues for sequencing    Ambulation/Gait Ambulation/Gait assistance: Min assist;Mod assist Gait Distance (Feet): 2 Feet Assistive device: Rolling walker (2 wheels) Gait Pattern/deviations: Step-to pattern;Trunk flexed;Decreased step length - right;Decreased step length - left Gait velocity: decreased     General Gait Details: Pt able to take several effortful, small steps at the EOB before fatiguing and needing to return to sitting  Stairs            Wheelchair Mobility    Modified Rankin (Stroke Patients Only)       Balance Overall balance assessment: Needs assistance   Sitting balance-Leahy Scale: Fair     Standing balance support: Bilateral upper extremity supported;During functional activity Standing balance-Leahy Scale: Poor  Pertinent Vitals/Pain Pain Assessment: No/denies pain    Home Living Family/patient expects to be discharged to:: Skilled nursing facility                        Prior Function Prior Level of Function : Other (comment)              Mobility Comments: Pt unable to provide prior level of function.  Per notes from recent prior admission pt was able to ambulate with a RW ADLs Comments: Per notes from recent admission family assists with meals (although pt able to boil eggs per dtr), set up meds. Indep with bathing, dressing.     Hand Dominance        Extremity/Trunk Assessment   Upper Extremity Assessment Upper Extremity Assessment: Generalized weakness    Lower Extremity Assessment Lower Extremity Assessment: Generalized weakness       Communication   Communication: HOH  Cognition Arousal/Alertness: Awake/alert Behavior During Therapy: WFL for tasks assessed/performed Overall Cognitive Status: No family/caregiver present to determine baseline cognitive functioning                                 General Comments: Pt able to follow simple 1-step commands with extra cuing, alert to self, unable to provide living situation or prior functional level        General Comments      Exercises     Assessment/Plan    PT Assessment Patient needs continued PT services  PT Problem List Decreased strength;Decreased activity tolerance;Decreased balance;Decreased mobility;Decreased knowledge of use of DME;Decreased safety awareness       PT Treatment Interventions DME instruction;Gait training;Functional mobility training;Therapeutic activities;Therapeutic exercise;Balance training;Patient/family education    PT Goals (Current goals can be found in the Care Plan section)  Acute Rehab PT Goals PT Goal Formulation: Patient unable to participate in goal setting Time For Goal Achievement: 03/03/21 Potential to Achieve Goals: Fair    Frequency Min 2X/week     Co-evaluation               AM-PAC PT "6 Clicks" Mobility  Outcome Measure Help needed turning from your back to your side while in a flat bed without using bedrails?: A Lot Help needed moving from lying on your back to sitting  on the side of a flat bed without using bedrails?: A Lot Help needed moving to and from a bed to a chair (including a wheelchair)?: A Lot Help needed standing up from a chair using your arms (e.g., wheelchair or bedside chair)?: A Lot Help needed to walk in hospital room?: Total Help needed climbing 3-5 steps with a railing? : Total 6 Click Score: 10    End of Session Equipment Utilized During Treatment: Gait belt Activity Tolerance: Patient tolerated treatment well Patient left: in bed;with call bell/phone within reach;with bed alarm set;with nursing/sitter in room Nurse Communication: Mobility status PT Visit Diagnosis: Muscle weakness (generalized) (M62.81);Difficulty in walking, not elsewhere classified (R26.2)    Time: 4132-4401 PT Time Calculation (min) (ACUTE ONLY): 21 min   Charges:   PT Evaluation $PT Eval Moderate Complexity: 1 Mod          D. Scott Murle Otting PT, DPT 02/18/21, 2:00 PM

## 2021-02-19 LAB — URINE CULTURE: Culture: >=100000 — AB

## 2021-04-16 DEATH — deceased

## 2021-04-25 ENCOUNTER — Encounter: Payer: Self-pay | Admitting: Surgery

## 2021-12-19 IMAGING — CT CT ABD-PELV W/O CM
2 of 4 series · 15 of 46 positions shown, 17 images · non-contrast
Comparison: Comparison is made with Tuesday April, 2007.

CLINICAL DATA: Abdominal pain in a [AGE] male.

EXAM:
CT ABDOMEN AND PELVIS WITHOUT CONTRAST
TECHNIQUE: Multidetector CT imaging of the abdomen and pelvis was performed
following the standard protocol without IV contrast.

[Series 2: axial st · axial · 0.86mm/px · z∈[-520,-34]mm · 12 of 107 slices shown, 14 images]
[im 5/107  soft-tissue]
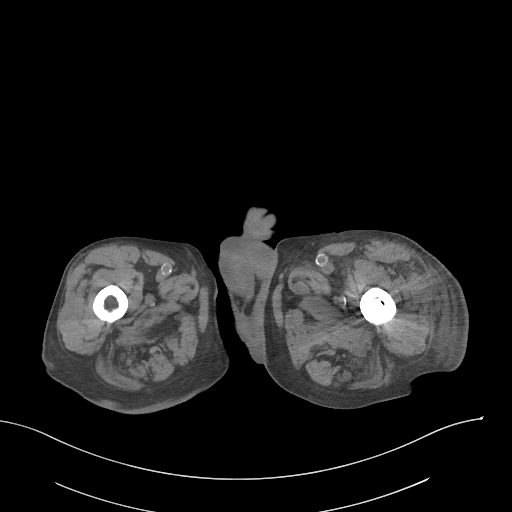
[im 5/107  bone]
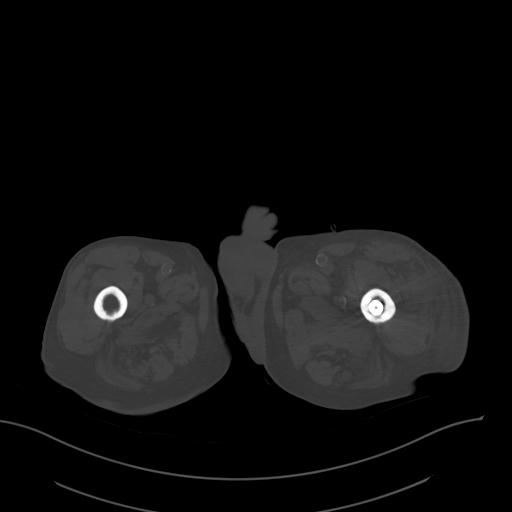
[im 14/107  soft-tissue]
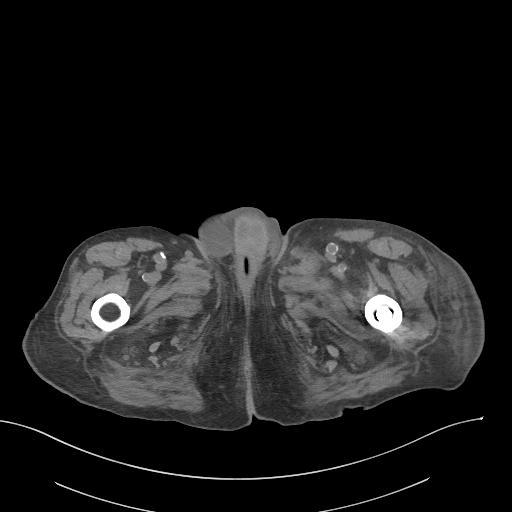
[im 24/107  soft-tissue]
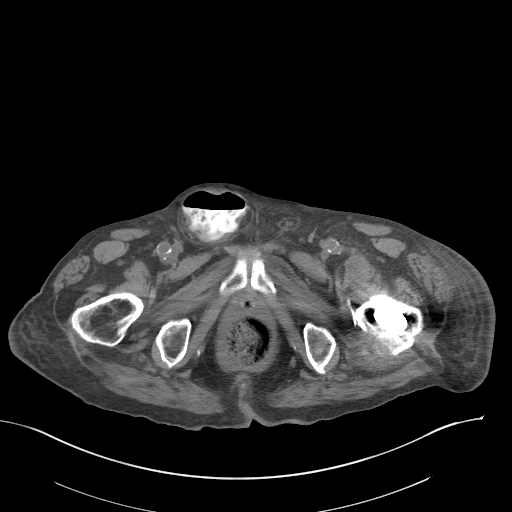
[im 33/107  soft-tissue]
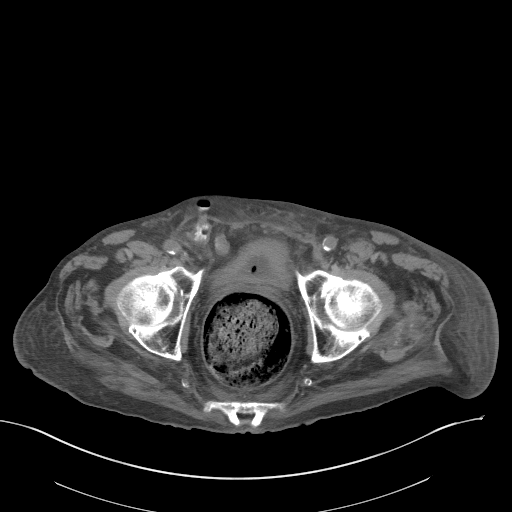
[im 42/107  soft-tissue]
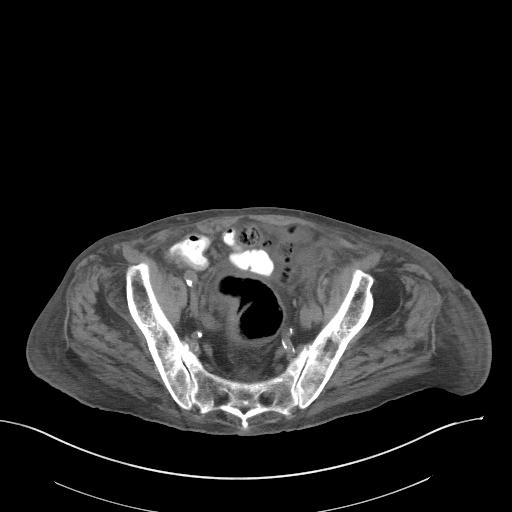
[im 51/107  soft-tissue]
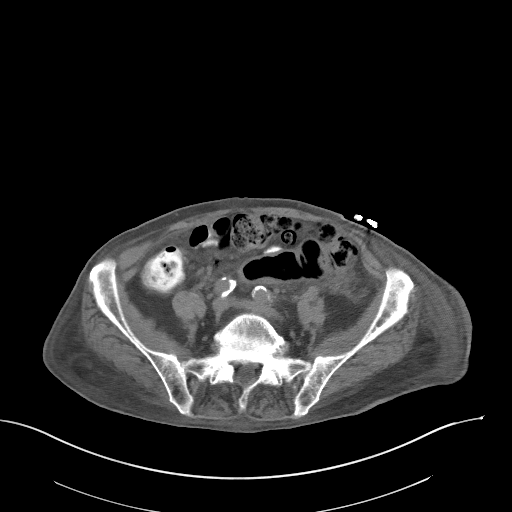
[im 56/107  soft-tissue]
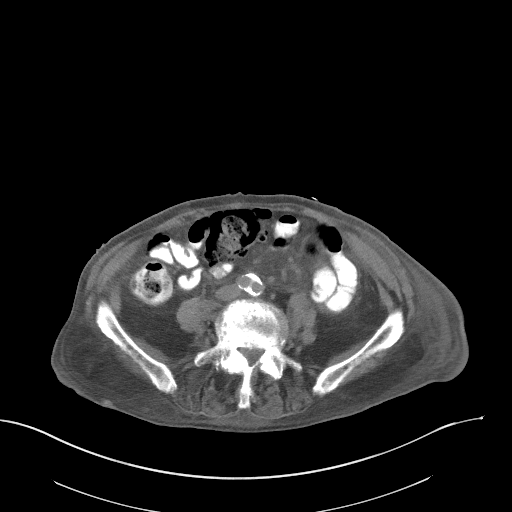
[im 65/107  soft-tissue]
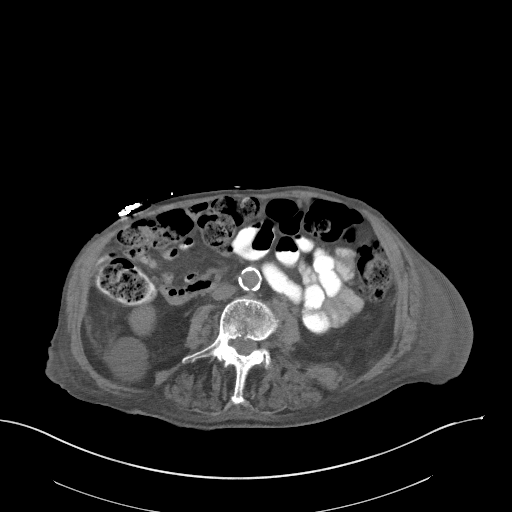
[im 74/107  soft-tissue]
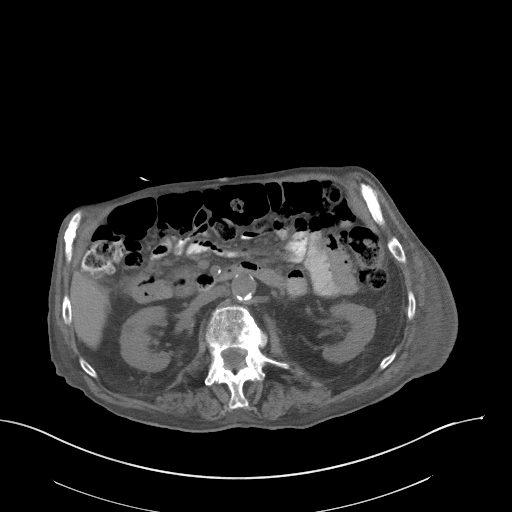
[im 74/107  bone]
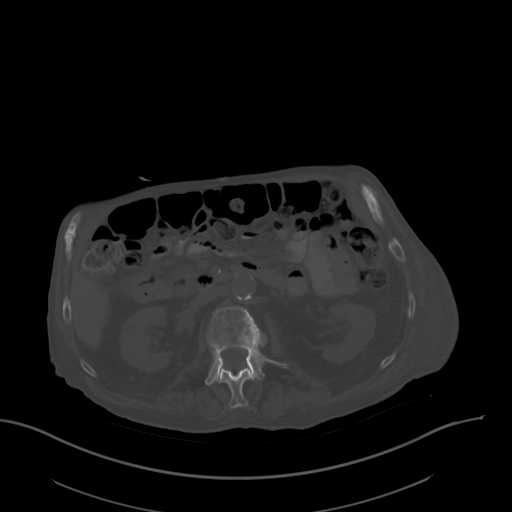
[im 83/107  soft-tissue]
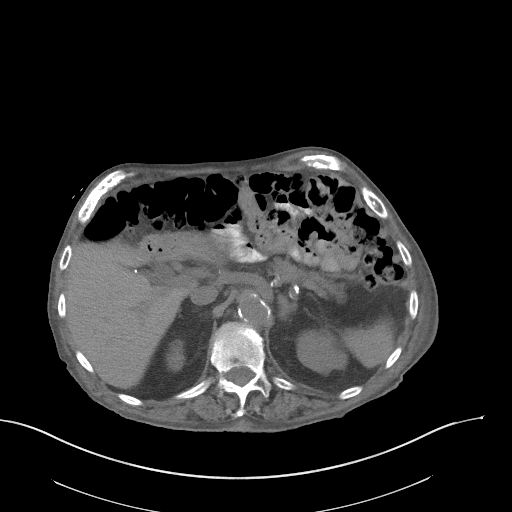
[im 93/107  soft-tissue]
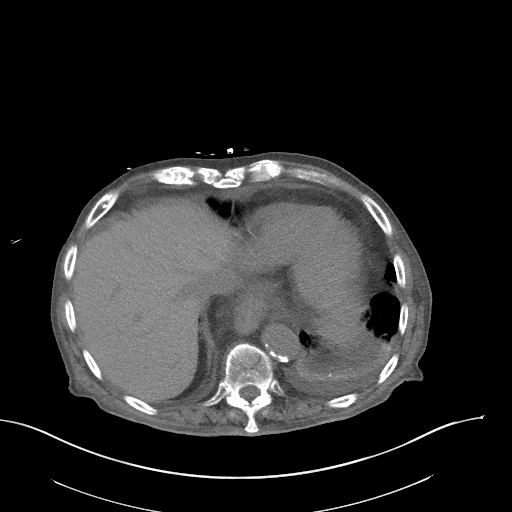
[im 102/107  soft-tissue]
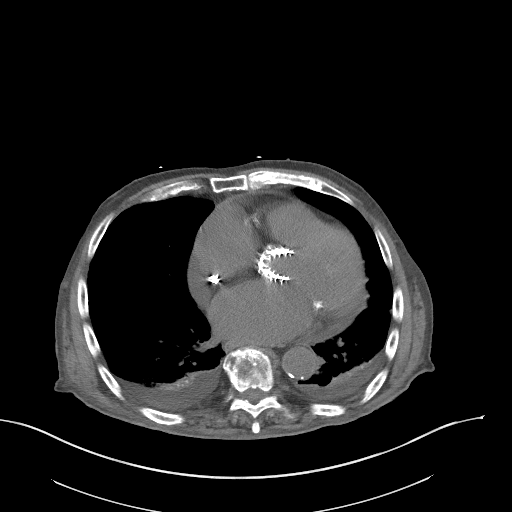

[Series 5: coronal st · coronal · 0.88mm/px · 3 of 84 slices shown]
[im 28/84  soft-tissue]
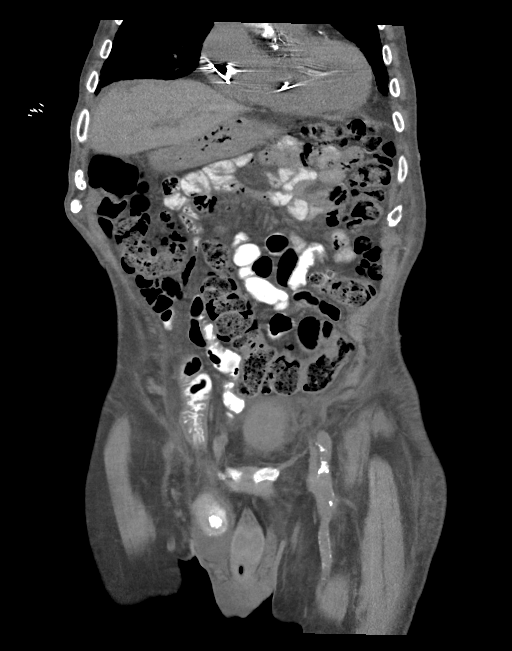
[im 37/84  soft-tissue]
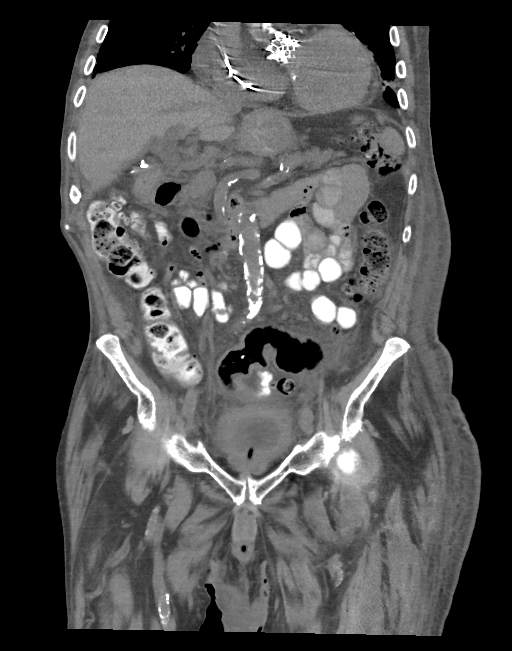
[im 47/84  soft-tissue]
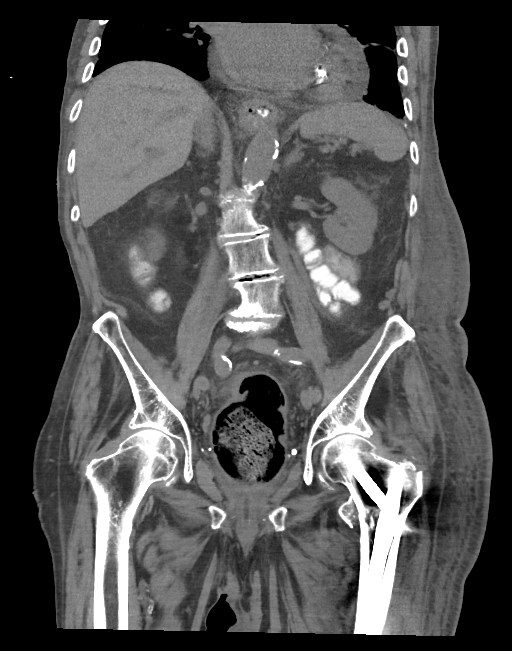

[15 of 46 positions shown; findings below may reference images not displayed]

FINDINGS: Lower chest: Small effusions. Basilar atelectasis. Signs of trans
arterial aortic valve replacement. Pacer device with leads in the
RIGHT heart, heart is incompletely imaged. Small pericardial
effusion.

Hepatobiliary: Post cholecystectomy.  Hepatic contours are smooth.

Pancreas: No signs of peripancreatic inflammation or gross ductal
dilation.

Spleen: Normal in size and contour.

Adrenals/Urinary Tract: Adrenal glands are normal.

No perinephric stranding. No suspicious renal lesion. Urinary
bladder is collapsed with marked perivesical stranding. Foley
catheter in situ.

Moderate size RIGHT renal cyst measures 4 cm.

Stomach/Bowel: Small hiatal hernia.

Cecum and ileocecal valve is contained within a RIGHT inguinal
hernia. Small amount of fluid in the hernia sac. No substantial wall
thickening. No signs of obstruction related to this process.
Appendix not visualized. Reportedly post appendectomy based on
outside clinical summaries. Colonic diverticulosis throughout the
colon. No signs of inflammation about the proximal colon the distal
colon in the rectosigmoid is mildly thickened. Stool fills the
entire colon. No signs of colonic twist but there is a large volume
of formed stool in the rectum. Rectal wall thickening and thickening
of the wall of the distal sigmoid colon is present. Perirectal
stranding also noted.

Vascular/Lymphatic:

Aortic atherosclerosis. No sign of aneurysm. Smooth contour of the
IVC. There is no gastrohepatic or hepatoduodenal ligament
lymphadenopathy. No retroperitoneal or mesenteric lymphadenopathy.

No pelvic sidewall lymphadenopathy.

Reproductive: Unremarkable by CT.

Other: Mildly wall edema in the setting of bilateral effusions.
Trace fluid in the abdomen also small amount of fluid in the RIGHT
inguinal canal.

Musculoskeletal: No acute bone finding. No destructive bone process.
Spinal degenerative changes. Post ORIF of a LEFT intratrochanteric
fracture. Construct for fixation is incompletely evaluated. No acute
process in this area.
IMPRESSION: Rectal wall thickening despite rectal distension with moderately
large volume of formed stool in the rectum, question fecal impaction
with developing stercoral colitis.

Perivesical stranding as well. Correlate with urinalysis to exclude
the possibility of concomitant cystitis.

RIGHT inguinal hernia contains the cecum and ileocecal valve without
obstruction

Body wall edema and bilateral effusions as discussed.

Aortic atherosclerosis.

Aortic Atherosclerosis ([id number]).
